# Patient Record
Sex: Male | Born: 1960
Health system: Southern US, Community
[De-identification: ages and names within clinical notes are randomized; demographics above are authoritative.]

## PROBLEM LIST (undated history)

## (undated) DIAGNOSIS — F32A Depression, unspecified: Secondary | ICD-10-CM

## (undated) DIAGNOSIS — M1990 Unspecified osteoarthritis, unspecified site: Secondary | ICD-10-CM

## (undated) DIAGNOSIS — E119 Type 2 diabetes mellitus without complications: Secondary | ICD-10-CM

## (undated) DIAGNOSIS — G4730 Sleep apnea, unspecified: Secondary | ICD-10-CM

## (undated) DIAGNOSIS — N200 Calculus of kidney: Secondary | ICD-10-CM

## (undated) DIAGNOSIS — M109 Gout, unspecified: Secondary | ICD-10-CM

## (undated) DIAGNOSIS — F419 Anxiety disorder, unspecified: Secondary | ICD-10-CM

## (undated) DIAGNOSIS — I10 Essential (primary) hypertension: Secondary | ICD-10-CM

## (undated) DIAGNOSIS — F329 Major depressive disorder, single episode, unspecified: Secondary | ICD-10-CM

## (undated) DIAGNOSIS — K219 Gastro-esophageal reflux disease without esophagitis: Secondary | ICD-10-CM

## (undated) DIAGNOSIS — N210 Calculus in bladder: Secondary | ICD-10-CM

## (undated) DIAGNOSIS — Z87442 Personal history of urinary calculi: Secondary | ICD-10-CM

## (undated) HISTORY — DX: Gastro-esophageal reflux disease without esophagitis: K21.9

## (undated) HISTORY — DX: Calculus of kidney: N20.0

## (undated) HISTORY — PX: CERVICAL FUSION: SHX112

## (undated) HISTORY — PX: BICEPS TENDON REPAIR: SHX566

## (undated) HISTORY — PX: SHOULDER SURGERY: SHX246

## (undated) HISTORY — PX: UMBILICAL HERNIA REPAIR: SHX196

## (undated) HISTORY — PX: LITHOTRIPSY: SUR834

## (undated) HISTORY — PX: HERNIA REPAIR: SHX51

---

## 2002-07-26 ENCOUNTER — Emergency Department (HOSPITAL_COMMUNITY): Admission: EM | Admit: 2002-07-26 | Discharge: 2002-07-26 | Payer: Self-pay | Admitting: Emergency Medicine

## 2002-07-26 ENCOUNTER — Encounter: Payer: Self-pay | Admitting: Emergency Medicine

## 2007-04-16 ENCOUNTER — Ambulatory Visit (HOSPITAL_COMMUNITY): Admission: RE | Admit: 2007-04-16 | Discharge: 2007-04-16 | Payer: Self-pay | Admitting: Urology

## 2007-04-23 ENCOUNTER — Ambulatory Visit (HOSPITAL_COMMUNITY): Admission: RE | Admit: 2007-04-23 | Discharge: 2007-04-23 | Payer: Self-pay | Admitting: Urology

## 2007-07-09 ENCOUNTER — Ambulatory Visit (HOSPITAL_COMMUNITY): Admission: RE | Admit: 2007-07-09 | Discharge: 2007-07-09 | Payer: Self-pay | Admitting: Urology

## 2007-07-16 ENCOUNTER — Ambulatory Visit (HOSPITAL_COMMUNITY): Admission: RE | Admit: 2007-07-16 | Discharge: 2007-07-16 | Payer: Self-pay | Admitting: Urology

## 2007-08-07 ENCOUNTER — Ambulatory Visit (HOSPITAL_COMMUNITY): Admission: RE | Admit: 2007-08-07 | Discharge: 2007-08-07 | Payer: Self-pay | Admitting: Urology

## 2008-10-26 ENCOUNTER — Inpatient Hospital Stay (HOSPITAL_COMMUNITY): Admission: RE | Admit: 2008-10-26 | Discharge: 2008-10-28 | Payer: Self-pay | Admitting: Neurosurgery

## 2010-06-10 LAB — CBC
Hemoglobin: 13.9 g/dL (ref 13.0–17.0)
RBC: 4.41 MIL/uL (ref 4.22–5.81)
RDW: 13.1 % (ref 11.5–15.5)

## 2010-07-18 NOTE — H&P (Signed)
NAME:  [name], WAGER NO.:  [id number]   MEDICAL RECORD NO.:  [id number]          PATIENT TYPE:  AMB   LOCATION:  DAY                           FACILITY:  [facility]   PHYSICIAN:  Ky Barban, M.D.DATE OF BIRTH:  1960-05-01   DATE OF ADMISSION:  04/08/2007  DATE OF DISCHARGE:  02/03/2009LH                              HISTORY & PHYSICAL   CHIEF COMPLAINT:  Right renal calculus.   HISTORY:  A 50 year old gentleman was seen by me in November 2007 with  gross hematuria and right renal colic. Workup showed that he had a stone  in both kidneys.  There is an 8 mm stone in the right kidney causing no  obstruction. I have not seen him since but he comes back having some  pain on that side wanting to get rid of this problem. At that time in  2007, he had a complete workup done for gross hematuria. The rest of the  workup was negative so bleeding probably came from this kidney. So his  coming as an outpatient to undergo ESL for right renal calculus and I  have discussed with him procedure, limitations, complications, need for  additional procedure.  Past history also includes history of having  gout, no hypertension or diabetes.  He had a right inguinal hernia and  umbilical hernia repairs done by Dr. Marlene Bast in 2000.   MEDICATION:  Allopurinol, colchicine, Xanax and Zoloft.   PERSONAL HISTORY:  He does not smoke, quit drinking a few years ago.   REVIEW OF SYSTEMS:  Unremarkable.   PHYSICAL EXAMINATION:  A well-nourished, well-developed male not in  acute distress, fully conscious, alert, oriented with blood pressure  111/64, temperature is 97.  CENTRAL NERVOUS SYSTEM:  No gross neurological deficit.  HEAD/NECK:  Negative.  CHEST:  __________.  HEART:  Regular sinus rhythm, no murmur.  ABDOMEN:  Soft, flat.  Liver, spleen, kidneys are not palpable.  No CVA  tenderness.   IMPRESSION:  Bilateral renal calculi.   PLAN:  ESL for right renal calculus.      Ky Barban, M.D.  Electronically Signed    MIJ/MEDQ  D:  04/15/2007  T:  04/16/2007  Job:  [id number]

## 2010-07-18 NOTE — Op Note (Signed)
[name], [name] NO.:  [id number]   MEDICAL RECORD NO.:  [id number]          PATIENT TYPE:  OIB   LOCATION:  3022                         FACILITY:  [facility]   PHYSICIAN:  Hilda Lias, M.D.   DATE OF BIRTH:  Jun 18, 1960   DATE OF PROCEDURE:  10/26/2008  DATE OF DISCHARGE:                               OPERATIVE REPORT   PREOPERATIVE DIAGNOSIS:  C5-6 and C6-7 herniated disk with right  radiculopathy.   POSTOPERATIVE DIAGNOSES:  C5-6 herniated disk with 7-8 fragment, right  C6-7 calcified posterior ligament.   PROCEDURE:  Anterior C5-6, C6-7 diskectomy, decompression of the spinal  cord, decompression of both C6 and C7 nerve root, interbody fusion with  auto and allograft plate and microscope.   SURGEON:  Hilda Lias, MD   ASSISTANT:  Stefani Dama, MD   CLINICAL HISTORY:  The patient was seen in my office several days ago  because of pain went to right upper extremity.  The patient had been  unable to sleep.  The only way for him to get relieved from pain was  putting his hand on top of the head.  It was quite miserable.  X-rays  showed herniated disk at the L5-6 and 6-7.  Surgery was advised.   COURSE IN THE HOSPITAL:  The patient was taken to surgery.  After  intubation, we had to hyperextend the neck.  The patient had almost no  space.  We had to pull and roll underneath the shoulder and finally we  were able to proceed with the surgery.  Transverse incision was made  through the skin and subcutaneous tissue down to the cervical area.  Prior to open it, we cleaned the skin with DuraPrep.  X-rays showed that  we were at the level of 4-5.  From then on, we identified 5-6, 6-7.  At  the level of 5-6, where we removed the anterior ligament.  Immediately,  we found quite a bit of degenerative disk.  We brought the microscope  and we opened the posterior ligament.  Once we opened the posterior  ligament from the midline to the right, there was at  least 7-8 fragment  disk compromising the C6 nerve root.  Decompression of the spinal cord  as well as foraminotomy was achieved.  At the end, we had a good  decompression with plenty of room for the C6 nerve root in the right  side and also in the left side.  At the level of C7, because of his  neck, it was quite difficult to get into disk space.  The patient had  calcification of the anterior ligament.  We had to drill the ligament to  get into the disk space.  We had to bring him in a Trendelenburg  position to be able to see C6-C7.  Then, with the microscope, we got  into the disk space and we opened the posterior ligament.  To the left  side, there was a normal herniated disk with mild compromising at the  left C7 nerve root.  From the middle to the right side, the  patient had  calcification of the ligament.  The patient had quite a bit of fibrosis  and we had to use the micro hook as well as the microcurette to  decompress the spinal cord as well as the C7 nerve root.  The patient  had quite a bit of adhesion at the level of the C7 in the right side.  However, with decompression, another two grafts of 5 mm with autograft  and BMX were inserted at the level of 5-6, 6-7 followed by a plate using  6 screws.  Lateral cervical spine showed good position of the plate from  top.  We were not able to see the appropriate level.  Once we  accomplished hemostasis, nevertheless we left the drain and the wound  was closed with Vicryl and Steri-Strips.           ______________________________  Hilda Lias, M.D.     EB/MEDQ  D:  10/26/2008  T:  10/27/2008  Job:  [id number]

## 2011-08-15 ENCOUNTER — Encounter (HOSPITAL_COMMUNITY): Payer: Self-pay | Admitting: *Deleted

## 2011-08-15 ENCOUNTER — Emergency Department (HOSPITAL_COMMUNITY): Payer: BC Managed Care – PPO

## 2011-08-15 ENCOUNTER — Emergency Department (HOSPITAL_COMMUNITY)
Admission: EM | Admit: 2011-08-15 | Discharge: 2011-08-15 | Disposition: A | Discharge status: Home / Self Care | Payer: BC Managed Care – PPO | Attending: Emergency Medicine | Admitting: Emergency Medicine

## 2011-08-15 DIAGNOSIS — M25519 Pain in unspecified shoulder: Secondary | ICD-10-CM | POA: Insufficient documentation

## 2011-08-15 DIAGNOSIS — F3289 Other specified depressive episodes: Secondary | ICD-10-CM | POA: Insufficient documentation

## 2011-08-15 DIAGNOSIS — S46911A Strain of unspecified muscle, fascia and tendon at shoulder and upper arm level, right arm, initial encounter: Secondary | ICD-10-CM

## 2011-08-15 DIAGNOSIS — F411 Generalized anxiety disorder: Secondary | ICD-10-CM | POA: Insufficient documentation

## 2011-08-15 DIAGNOSIS — S96911A Strain of unspecified muscle and tendon at ankle and foot level, right foot, initial encounter: Secondary | ICD-10-CM

## 2011-08-15 DIAGNOSIS — F329 Major depressive disorder, single episode, unspecified: Secondary | ICD-10-CM | POA: Insufficient documentation

## 2011-08-15 DIAGNOSIS — F1010 Alcohol abuse, uncomplicated: Secondary | ICD-10-CM | POA: Insufficient documentation

## 2011-08-15 DIAGNOSIS — M109 Gout, unspecified: Secondary | ICD-10-CM | POA: Insufficient documentation

## 2011-08-15 DIAGNOSIS — M25579 Pain in unspecified ankle and joints of unspecified foot: Secondary | ICD-10-CM | POA: Insufficient documentation

## 2011-08-15 HISTORY — DX: Major depressive disorder, single episode, unspecified: F32.9

## 2011-08-15 HISTORY — DX: Depression, unspecified: F32.A

## 2011-08-15 HISTORY — DX: Gout, unspecified: M10.9

## 2011-08-15 HISTORY — DX: Anxiety disorder, unspecified: F41.9

## 2011-08-15 MED ORDER — MELOXICAM 7.5 MG PO TABS: ORAL_TABLET | ORAL | Status: DC
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
## 2011-08-15 NOTE — ED Provider Notes (Signed)
Medical screening examination/treatment/procedure(s) were performed by non-physician practitioner and as supervising physician I was immediately available for consultation/collaboration.   Joya Gaskins, MD 08/15/11 1420

## 2011-08-15 NOTE — Discharge Instructions (Signed)
Please use the ankle splint for the next 10 to 14 days. Please elevate the ankle as much as possible. Mobic daily for inflammation and swelling.Ankle Sprain An ankle sprain is an injury to the strong, fibrous tissues (ligaments) that hold the bones of your ankle joint together.  CAUSES Ankle sprain usually is caused by a fall or by twisting your ankle. People who participate in sports are more prone to these types of injuries.  SYMPTOMS  Symptoms of ankle sprain include:  Pain in your ankle. The pain may be present at rest or only when you are trying to stand or walk.   Swelling.   Bruising. Bruising may develop immediately or within 1 to 2 days after your injury.   Difficulty standing or walking.  DIAGNOSIS  Your caregiver will ask you details about your injury and perform a physical exam of your ankle to determine if you have an ankle sprain. During the physical exam, your caregiver will press and squeeze specific areas of your foot and ankle. Your caregiver will try to move your ankle in certain ways. An X-ray exam may be done to be sure a bone was not broken or a ligament did not separate from one of the bones in your ankle (avulsion).  TREATMENT  Certain types of braces can help stabilize your ankle. Your caregiver can make a recommendation for this. Your caregiver may recommend the use of medication for pain. If your sprain is severe, your caregiver may refer you to a surgeon who helps to restore function to parts of your skeletal system (orthopedist) or a physical therapist. HOME CARE INSTRUCTIONS  Apply ice to your injury for 1 to 2 days or as directed by your caregiver. Applying ice helps to reduce inflammation and pain.  Put ice in a plastic bag.   Place a towel between your skin and the bag.   Leave the ice on for 15 to 20 minutes at a time, every 2 hours while you are awake.   Take over-the-counter or prescription medicines for pain, discomfort, or fever only as directed by  your caregiver.   Keep your injured leg elevated, when possible, to lessen swelling.   If your caregiver recommends crutches, use them as instructed. Gradually, put weight on the affected ankle. Continue to use crutches or a cane until you can walk without feeling pain in your ankle.   If you have a plaster splint, wear the splint as directed by your caregiver. Do not rest it on anything harder than a pillow the first 24 hours. Do not put weight on it. Do not get it wet. You may take it off to take a shower or bath.   You may have been given an elastic bandage to wear around your ankle to provide support. If the elastic bandage is too tight (you have numbness or tingling in your foot or your foot becomes cold and blue), adjust the bandage to make it comfortable.   If you have an air splint, you may blow more air into it or let air out to make it more comfortable. You may take your splint off at night and before taking a shower or bath.   Wiggle your toes in the splint several times per day if you are able.  SEEK MEDICAL CARE IF:   You have an increase in bruising, swelling, or pain.   Your toes feel cold.   Pain relief is not achieved with medication.  SEEK IMMEDIATE MEDICAL CARE IF: Your  toes are numb or blue or you have severe pain. MAKE SURE YOU:   Understand these instructions.   Will watch your condition.   Will get help right away if you are not doing well or get worse.  Document Released: 02/19/2005 Document Revised: 02/08/2011 Document Reviewed: 09/24/2007 [facility] Patient Information 2012 [place], [place].
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
## 2011-10-17 ENCOUNTER — Emergency Department (HOSPITAL_COMMUNITY)
Admission: EM | Admit: 2011-10-17 | Discharge: 2011-10-17 | Disposition: A | Discharge status: Home / Self Care | Payer: BC Managed Care – PPO | Attending: Emergency Medicine | Admitting: Emergency Medicine

## 2011-10-17 ENCOUNTER — Encounter (HOSPITAL_COMMUNITY): Payer: Self-pay | Admitting: *Deleted

## 2011-10-17 DIAGNOSIS — B349 Viral infection, unspecified: Secondary | ICD-10-CM

## 2011-10-17 DIAGNOSIS — F329 Major depressive disorder, single episode, unspecified: Secondary | ICD-10-CM | POA: Insufficient documentation

## 2011-10-17 DIAGNOSIS — R110 Nausea: Secondary | ICD-10-CM | POA: Insufficient documentation

## 2011-10-17 DIAGNOSIS — IMO0001 Reserved for inherently not codable concepts without codable children: Secondary | ICD-10-CM | POA: Insufficient documentation

## 2011-10-17 DIAGNOSIS — I10 Essential (primary) hypertension: Secondary | ICD-10-CM | POA: Insufficient documentation

## 2011-10-17 DIAGNOSIS — F3289 Other specified depressive episodes: Secondary | ICD-10-CM | POA: Insufficient documentation

## 2011-10-17 DIAGNOSIS — F411 Generalized anxiety disorder: Secondary | ICD-10-CM | POA: Insufficient documentation

## 2011-10-17 DIAGNOSIS — Z981 Arthrodesis status: Secondary | ICD-10-CM | POA: Insufficient documentation

## 2011-10-17 DIAGNOSIS — M109 Gout, unspecified: Secondary | ICD-10-CM | POA: Insufficient documentation

## 2011-10-17 DIAGNOSIS — R42 Dizziness and giddiness: Secondary | ICD-10-CM

## 2011-10-17 HISTORY — DX: Essential (primary) hypertension: I10

## 2011-10-17 LAB — URINALYSIS, ROUTINE W REFLEX MICROSCOPIC
Glucose, UA: NEGATIVE mg/dL
Hgb urine dipstick: NEGATIVE
Leukocytes, UA: NEGATIVE
Protein, ur: NEGATIVE mg/dL
Specific Gravity, Urine: 1.024 (ref 1.005–1.030)
pH: 5 (ref 5.0–8.0)

## 2011-10-17 LAB — CBC
Hemoglobin: 16.6 g/dL (ref 13.0–17.0)
MCH: 31.9 pg (ref 26.0–34.0)
Platelets: 167 10*3/uL (ref 150–400)
RBC: 5.21 MIL/uL (ref 4.22–5.81)
WBC: 13.5 10*3/uL — ABNORMAL HIGH (ref 4.0–10.5)

## 2011-10-17 LAB — POCT I-STAT, CHEM 8
BUN: 12 mg/dL (ref 6–23)
Chloride: 102 mEq/L (ref 96–112)
Creatinine, Ser: 1.1 mg/dL (ref 0.50–1.35)
Sodium: 142 mEq/L (ref 135–145)
TCO2: 27 mmol/L (ref 0–100)

## 2011-10-17 MED ORDER — SODIUM CHLORIDE 0.9 % IV BOLUS (SEPSIS)
1000.0000 mL | Freq: Once | INTRAVENOUS | Status: AC
  Administered 2011-10-17: 1000 mL via INTRAVENOUS

## 2011-10-17 MED ORDER — ONDANSETRON 4 MG PO TBDP
8.0000 mg | ORAL_TABLET | Freq: Once | ORAL | Status: AC
  Administered 2011-10-17: 8 mg via ORAL
  Filled 2011-10-17: qty 2

## 2011-10-17 MED ORDER — ONDANSETRON 8 MG PO TBDP: 8.0000 mg | ORAL_TABLET | Freq: Three times a day (TID) | ORAL | Status: AC | PRN

## 2011-10-17 MED ORDER — MECLIZINE HCL 25 MG PO TABS
25.0000 mg | ORAL_TABLET | Freq: Once | ORAL | Status: AC
  Administered 2011-10-17: 25 mg via ORAL
  Filled 2011-10-17: qty 1

## 2011-10-17 NOTE — ED Notes (Signed)
Reports onset last night of bodyaches and pain, woke up this am "feeling foggy headed" and nauseated. No acute distress noted at triage.

## 2011-10-17 NOTE — ED Provider Notes (Signed)
History     CSN: [id number]  Arrival date & time 10/17/11  4098   First MD Initiated Contact with Patient 10/17/11 1304      Chief Complaint  Patient presents with  . Nausea  . Generalized Body Aches    (Consider location/radiation/quality/duration/timing/severity/associated sxs/prior treatment) HPI Comments: Patient comes in today with a chief complaint of fever, chills, generalized body aches, nausea, fatigue, and decreased appetite.  He has had these symptoms for the past couple of days.  Symptoms gradually worsening.  Wife has had similar symptoms.  This morning he began feeling dizzy.  He describes dizziness as both feeling lightheaded and vertigo.  No syncope.  He denies any Chest pain or SOB.  He is feeling nauseous, but no vomiting.  No abdominal pain.  He denies cough.  He denies dysuria.  No headache.  He has not taken anything for his symptoms.    Patient is a 51 y.o. male presenting with fever. The history is provided by the patient.  Fever Primary symptoms of the febrile illness include fever, fatigue, nausea and myalgias. Primary symptoms do not include visual change, headaches, cough, wheezing, shortness of breath, abdominal pain, vomiting, diarrhea, dysuria, altered mental status or rash.    Past Medical History  Diagnosis Date  . Depression   . Anxiety   . Gout   . Hypertension     Past Surgical History  Procedure Date  . Cervical fusion   . Hernia repair     No family history on file.  History  Substance Use Topics  . Smoking status: Never Smoker   . Smokeless tobacco: Not on file  . Alcohol Use: Yes     3 x /wk      Review of Systems  Constitutional: Positive for fever, chills, appetite change and fatigue. Negative for diaphoresis.  HENT: Negative for sore throat, neck pain and neck stiffness.   Eyes: Negative for visual disturbance.  Respiratory: Negative for cough, chest tightness, shortness of breath and wheezing.   Cardiovascular: Negative  for chest pain.  Gastrointestinal: Positive for nausea. Negative for vomiting, abdominal pain, diarrhea and constipation.  Genitourinary: Negative for dysuria, urgency and hematuria.  Musculoskeletal: Positive for myalgias.  Skin: Negative for rash.  Neurological: Positive for dizziness and light-headedness. Negative for syncope, numbness and headaches.       Generalized weakness  Psychiatric/Behavioral: Negative for confusion and altered mental status.    Allergies  Other  Home Medications   Current Outpatient Rx  Name Route Sig Dispense Refill  . ALLOPURINOL 300 MG PO TABS Oral Take 300 mg by mouth daily.    Marland Kitchen ALPRAZOLAM 0.5 MG PO TABS Oral Take 0.5 mg by mouth daily.    Marland Kitchen HYDROCODONE-ACETAMINOPHEN 5-325 MG PO TABS Oral Take 1 tablet by mouth every 4 (four) hours as needed. For pain    . LISINOPRIL PO Oral Take 1 tablet by mouth daily.    Marland Kitchen ZOLOFT PO Oral Take 1 tablet by mouth daily.      BP 154/98  Pulse 95  Temp 99.8 F (37.7 C) (Oral)  Resp 17  SpO2 98%  Physical Exam  Nursing note and vitals reviewed. Constitutional: He appears well-developed and well-nourished. No distress.  HENT:  Head: Normocephalic and atraumatic.  Mouth/Throat: Oropharynx is clear and moist.  Eyes: EOM are normal. Pupils are equal, round, and reactive to light.  Neck: Normal range of motion. Neck supple.  Cardiovascular: Normal rate, regular rhythm and normal heart sounds.  Pulmonary/Chest: Effort normal and breath sounds normal. No respiratory distress. He has no wheezes. He has no rales. He exhibits no tenderness.  Abdominal: Soft. Bowel sounds are normal. He exhibits no distension and no mass. There is no tenderness. There is no rebound and no guarding.  Neurological: He is alert. He has normal strength. No cranial nerve deficit or sensory deficit. Gait normal.  Skin: Skin is warm. He is not diaphoretic.  Psychiatric: He has a normal mood and affect.    ED Course  Procedures (including  critical care time)  Labs Reviewed  CBC - Abnormal; Notable for the following:    WBC 13.5 (*)     MCHC 36.3 (*)     All other components within normal limits  POCT I-STAT, CHEM 8 - Abnormal; Notable for the following:    Glucose, Bld 103 (*)     Hemoglobin 17.3 (*)     All other components within normal limits  URINALYSIS, ROUTINE W REFLEX MICROSCOPIC   No results found.   Date: 10/17/2011  Rate: 90  Rhythm: normal sinus rhythm  QRS Axis: normal  Intervals: normal  ST/T Wave abnormalities: normal  Conduction Disutrbances:none  Narrative Interpretation:   Old EKG Reviewed: none available   No diagnosis found.  3:47 PM Reassessed patient.  He reports that his symptoms have improved at this time after given IVF.  Dizziness has significantly improved.    MDM  Patient presenting with low grade fever, dizziness, nausea, decreased appetite, generalized body aches, and fatigue.  Suspect that symptoms are viral.  Patient with normal EKG.  Dizziness improved after given IVF.  UA negative.  Labs unremarkable.  Patient discharged home.  Strict return precautions given to the patient.  Patient in agreement with the plan.        Pascal Lux [place], PA-C 10/17/11 2056

## 2011-10-17 NOTE — ED Notes (Signed)
[  t states he woke this am feeling dizzy and nauseated.  Pt state he had fever of 99.0 this am.  No fever preventive medications were taken

## 2011-10-17 NOTE — ED Notes (Signed)
Pt reports body aches

## 2011-10-17 NOTE — ED Notes (Signed)
Pt reports dizziness with sitting and standing during orthostatics

## 2011-10-19 NOTE — ED Provider Notes (Signed)
Medical screening examination/treatment/procedure(s) were conducted as a shared visit with non-physician practitioner(s) and myself.  I personally evaluated the patient during the encounter  Ethelda Chick, MD 10/19/11 [phone]

## 2012-07-25 ENCOUNTER — Encounter (HOSPITAL_COMMUNITY): Payer: Self-pay | Admitting: *Deleted

## 2012-07-29 ENCOUNTER — Encounter (HOSPITAL_COMMUNITY): Payer: Self-pay | Admitting: Anesthesiology

## 2012-07-29 ENCOUNTER — Ambulatory Visit (HOSPITAL_COMMUNITY): Payer: Worker's Compensation | Admitting: Anesthesiology

## 2012-07-29 ENCOUNTER — Ambulatory Visit (HOSPITAL_COMMUNITY): Payer: Worker's Compensation

## 2012-07-29 ENCOUNTER — Encounter (HOSPITAL_COMMUNITY): Payer: Self-pay | Admitting: *Deleted

## 2012-07-29 ENCOUNTER — Ambulatory Visit (HOSPITAL_COMMUNITY)
Admission: RE | Admit: 2012-07-29 | Discharge: 2012-07-30 | Disposition: A | Discharge status: Home / Self Care | Payer: Worker's Compensation | Source: Ambulatory Visit | Attending: Orthopedic Surgery | Admitting: Orthopedic Surgery

## 2012-07-29 ENCOUNTER — Encounter (HOSPITAL_COMMUNITY)
Admission: RE | Disposition: A | Discharge status: Home / Self Care | Payer: Self-pay | Source: Ambulatory Visit | Attending: Orthopedic Surgery

## 2012-07-29 DIAGNOSIS — Y990 Civilian activity done for income or pay: Secondary | ICD-10-CM | POA: Insufficient documentation

## 2012-07-29 DIAGNOSIS — M24119 Other articular cartilage disorders, unspecified shoulder: Secondary | ICD-10-CM | POA: Insufficient documentation

## 2012-07-29 DIAGNOSIS — Z79899 Other long term (current) drug therapy: Secondary | ICD-10-CM | POA: Insufficient documentation

## 2012-07-29 DIAGNOSIS — S43429A Sprain of unspecified rotator cuff capsule, initial encounter: Secondary | ICD-10-CM | POA: Insufficient documentation

## 2012-07-29 DIAGNOSIS — M19019 Primary osteoarthritis, unspecified shoulder: Secondary | ICD-10-CM | POA: Insufficient documentation

## 2012-07-29 DIAGNOSIS — X58XXXA Exposure to other specified factors, initial encounter: Secondary | ICD-10-CM | POA: Insufficient documentation

## 2012-07-29 DIAGNOSIS — M75102 Unspecified rotator cuff tear or rupture of left shoulder, not specified as traumatic: Secondary | ICD-10-CM

## 2012-07-29 DIAGNOSIS — I10 Essential (primary) hypertension: Secondary | ICD-10-CM | POA: Insufficient documentation

## 2012-07-29 DIAGNOSIS — Y929 Unspecified place or not applicable: Secondary | ICD-10-CM | POA: Insufficient documentation

## 2012-07-29 DIAGNOSIS — M25819 Other specified joint disorders, unspecified shoulder: Secondary | ICD-10-CM | POA: Insufficient documentation

## 2012-07-29 HISTORY — DX: Calculus in bladder: N21.0

## 2012-07-29 HISTORY — PX: SHOULDER ARTHROSCOPY WITH ROTATOR CUFF REPAIR AND SUBACROMIAL DECOMPRESSION: SHX5686

## 2012-07-29 HISTORY — DX: Sleep apnea, unspecified: G47.30

## 2012-07-29 LAB — BASIC METABOLIC PANEL
BUN: 11 mg/dL (ref 6–23)
Calcium: 9.5 mg/dL (ref 8.4–10.5)
Chloride: 103 mEq/L (ref 96–112)
Creatinine, Ser: 1.1 mg/dL (ref 0.50–1.35)
GFR calc Af Amer: 89 mL/min — ABNORMAL LOW (ref >90)
GFR calc non Af Amer: 77 mL/min — ABNORMAL LOW (ref >90)

## 2012-07-29 LAB — CBC
MCHC: 36.4 g/dL — ABNORMAL HIGH (ref 30.0–36.0)
Platelets: 203 10*3/uL (ref 150–400)
RDW: 12.8 % (ref 11.5–15.5)
WBC: 7.1 10*3/uL (ref 4.0–10.5)

## 2012-07-29 LAB — SURGICAL PCR SCREEN
MRSA, PCR: NEGATIVE
Staphylococcus aureus: NEGATIVE

## 2012-07-29 SURGERY — SHOULDER ARTHROSCOPY WITH ROTATOR CUFF REPAIR AND SUBACROMIAL DECOMPRESSION
Anesthesia: Regional | Site: Shoulder | Laterality: Left | Wound class: Clean

## 2012-07-29 MED ORDER — PROPOFOL 10 MG/ML IV BOLUS
INTRAVENOUS | Status: DC | PRN
  Administered 2012-07-29: 260 mg via INTRAVENOUS

## 2012-07-29 MED ORDER — DEXTROSE 5 % IV SOLN
INTRAVENOUS | Status: DC | PRN
  Administered 2012-07-29: 13:00:00 via INTRAVENOUS

## 2012-07-29 MED ORDER — MENTHOL 3 MG MT LOZG: 1.0000 | LOZENGE | OROMUCOSAL | Status: DC | PRN

## 2012-07-29 MED ORDER — METHOCARBAMOL 500 MG PO TABS
500.0000 mg | ORAL_TABLET | Freq: Four times a day (QID) | ORAL | Status: DC | PRN
  Administered 2012-07-29 – 2012-07-30 (×2): 500 mg via ORAL
  Filled 2012-07-29: qty 1

## 2012-07-29 MED ORDER — LIDOCAINE HCL (CARDIAC) 20 MG/ML IV SOLN
INTRAVENOUS | Status: DC | PRN
  Administered 2012-07-29: 100 mg via INTRAVENOUS

## 2012-07-29 MED ORDER — HYDROMORPHONE HCL PF 1 MG/ML IJ SOLN
INTRAMUSCULAR | Status: AC
  Filled 2012-07-29: qty 1

## 2012-07-29 MED ORDER — HYDROMORPHONE HCL PF 1 MG/ML IJ SOLN
0.2500 mg | INTRAMUSCULAR | Status: DC | PRN
  Administered 2012-07-29 (×2): 0.5 mg via INTRAVENOUS

## 2012-07-29 MED ORDER — LACTATED RINGERS IV SOLN
INTRAVENOUS | Status: DC | PRN
  Administered 2012-07-29 (×3): via INTRAVENOUS

## 2012-07-29 MED ORDER — SUCCINYLCHOLINE CHLORIDE 20 MG/ML IJ SOLN
INTRAMUSCULAR | Status: DC | PRN
  Administered 2012-07-29: 160 mg via INTRAVENOUS

## 2012-07-29 MED ORDER — ROCURONIUM BROMIDE 100 MG/10ML IV SOLN
INTRAVENOUS | Status: DC | PRN
  Administered 2012-07-29: 50 mg via INTRAVENOUS

## 2012-07-29 MED ORDER — FENTANYL CITRATE 0.05 MG/ML IJ SOLN
INTRAMUSCULAR | Status: DC | PRN
  Administered 2012-07-29 (×2): 50 ug via INTRAVENOUS

## 2012-07-29 MED ORDER — CEFAZOLIN SODIUM 1-5 GM-% IV SOLN
INTRAVENOUS | Status: DC | PRN
  Administered 2012-07-29: 2 g via INTRAVENOUS

## 2012-07-29 MED ORDER — PHENYLEPHRINE HCL 10 MG/ML IJ SOLN
INTRAMUSCULAR | Status: DC | PRN
  Administered 2012-07-29 (×2): 40 ug via INTRAVENOUS
  Administered 2012-07-29: 80 ug via INTRAVENOUS
  Administered 2012-07-29: 40 ug via INTRAVENOUS
  Administered 2012-07-29: 80 ug via INTRAVENOUS
  Administered 2012-07-29 (×2): 40 ug via INTRAVENOUS

## 2012-07-29 MED ORDER — METOCLOPRAMIDE HCL 5 MG/ML IJ SOLN
INTRAMUSCULAR | Status: AC
  Filled 2012-07-29: qty 2

## 2012-07-29 MED ORDER — ALUM & MAG HYDROXIDE-SIMETH 200-200-20 MG/5ML PO SUSP: 30.0000 mL | ORAL | Status: DC | PRN

## 2012-07-29 MED ORDER — POLYETHYLENE GLYCOL 3350 17 G PO PACK: 17.0000 g | PACK | Freq: Every day | ORAL | Status: DC | PRN

## 2012-07-29 MED ORDER — OXYCODONE HCL 5 MG PO TABS: 5.0000 mg | ORAL_TABLET | Freq: Once | ORAL | Status: DC | PRN

## 2012-07-29 MED ORDER — MIDAZOLAM HCL 5 MG/5ML IJ SOLN
INTRAMUSCULAR | Status: DC | PRN
  Administered 2012-07-29 (×2): 1 mg via INTRAVENOUS

## 2012-07-29 MED ORDER — ALPRAZOLAM 0.5 MG PO TABS: 0.5000 mg | ORAL_TABLET | Freq: Two times a day (BID) | ORAL | Status: DC | PRN

## 2012-07-29 MED ORDER — TEMAZEPAM 15 MG PO CAPS: 15.0000 mg | ORAL_CAPSULE | Freq: Every evening | ORAL | Status: DC | PRN

## 2012-07-29 MED ORDER — MUPIROCIN 2 % EX OINT
TOPICAL_OINTMENT | CUTANEOUS | Status: AC
  Administered 2012-07-29: 12:00:00
  Filled 2012-07-29: qty 22

## 2012-07-29 MED ORDER — METOCLOPRAMIDE HCL 5 MG/ML IJ SOLN
5.0000 mg | Freq: Three times a day (TID) | INTRAMUSCULAR | Status: DC | PRN
  Administered 2012-07-29: 5 mg via INTRAVENOUS

## 2012-07-29 MED ORDER — LISINOPRIL 5 MG PO TABS
5.0000 mg | ORAL_TABLET | Freq: Every day | ORAL | Status: DC
  Administered 2012-07-30: 5 mg via ORAL
  Filled 2012-07-29: qty 1

## 2012-07-29 MED ORDER — BISACODYL 5 MG PO TBEC: 5.0000 mg | DELAYED_RELEASE_TABLET | Freq: Every day | ORAL | Status: DC | PRN

## 2012-07-29 MED ORDER — GLYCOPYRROLATE 0.2 MG/ML IJ SOLN
INTRAMUSCULAR | Status: DC | PRN
  Administered 2012-07-29: .15 mg via INTRAVENOUS
  Administered 2012-07-29: .8 mg via INTRAVENOUS
  Administered 2012-07-29: 0.1 mg via INTRAVENOUS

## 2012-07-29 MED ORDER — FENTANYL CITRATE 0.05 MG/ML IJ SOLN: 50.0000 ug | Freq: Once | INTRAMUSCULAR | Status: DC

## 2012-07-29 MED ORDER — DEXAMETHASONE SODIUM PHOSPHATE 4 MG/ML IJ SOLN
INTRAMUSCULAR | Status: DC | PRN
  Administered 2012-07-29: 8 mg via INTRAVENOUS
  Administered 2012-07-29: 4 mg

## 2012-07-29 MED ORDER — KETOROLAC TROMETHAMINE 15 MG/ML IJ SOLN
15.0000 mg | Freq: Four times a day (QID) | INTRAMUSCULAR | Status: DC
  Administered 2012-07-29 – 2012-07-30 (×3): 15 mg via INTRAVENOUS
  Filled 2012-07-29 (×7): qty 1

## 2012-07-29 MED ORDER — ONDANSETRON HCL 4 MG/2ML IJ SOLN
INTRAMUSCULAR | Status: DC | PRN
  Administered 2012-07-29: 4 mg via INTRAVENOUS

## 2012-07-29 MED ORDER — EPHEDRINE SULFATE 50 MG/ML IJ SOLN
INTRAMUSCULAR | Status: DC | PRN
  Administered 2012-07-29: 10 mg via INTRAVENOUS

## 2012-07-29 MED ORDER — FLEET ENEMA 7-19 GM/118ML RE ENEM: 1.0000 | ENEMA | Freq: Once | RECTAL | Status: AC | PRN

## 2012-07-29 MED ORDER — ALLOPURINOL 150 MG HALF TABLET
150.0000 mg | ORAL_TABLET | Freq: Every day | ORAL | Status: DC
  Administered 2012-07-30: 150 mg via ORAL
  Filled 2012-07-29: qty 1

## 2012-07-29 MED ORDER — LACTATED RINGERS IV SOLN: INTRAVENOUS | Status: DC

## 2012-07-29 MED ORDER — BUPIVACAINE-EPINEPHRINE PF 0.5-1:200000 % IJ SOLN
INTRAMUSCULAR | Status: DC | PRN
  Administered 2012-07-29: 25 mL

## 2012-07-29 MED ORDER — HYDROMORPHONE HCL PF 1 MG/ML IJ SOLN: 0.2500 mg | INTRAMUSCULAR | Status: DC | PRN

## 2012-07-29 MED ORDER — OXYCODONE-ACETAMINOPHEN 5-325 MG PO TABS
ORAL_TABLET | ORAL | Status: AC
  Filled 2012-07-29: qty 2

## 2012-07-29 MED ORDER — PHENOL 1.4 % MT LIQD: 1.0000 | OROMUCOSAL | Status: DC | PRN

## 2012-07-29 MED ORDER — SERTRALINE HCL 100 MG PO TABS
100.0000 mg | ORAL_TABLET | Freq: Every day | ORAL | Status: DC
  Administered 2012-07-30: 100 mg via ORAL
  Filled 2012-07-29: qty 1

## 2012-07-29 MED ORDER — METHOCARBAMOL 500 MG PO TABS
ORAL_TABLET | ORAL | Status: AC
  Filled 2012-07-29: qty 1

## 2012-07-29 MED ORDER — ACETAMINOPHEN 650 MG RE SUPP: 650.0000 mg | Freq: Four times a day (QID) | RECTAL | Status: DC | PRN

## 2012-07-29 MED ORDER — METOCLOPRAMIDE HCL 5 MG PO TABS
5.0000 mg | ORAL_TABLET | Freq: Three times a day (TID) | ORAL | Status: DC | PRN
  Filled 2012-07-29: qty 2

## 2012-07-29 MED ORDER — OXYCODONE-ACETAMINOPHEN 5-325 MG PO TABS
1.0000 | ORAL_TABLET | ORAL | Status: DC | PRN
  Administered 2012-07-29 – 2012-07-30 (×3): 2 via ORAL
  Filled 2012-07-29 (×3): qty 2

## 2012-07-29 MED ORDER — LACTATED RINGERS IV SOLN
INTRAVENOUS | Status: DC
  Administered 2012-07-29: 12:00:00 via INTRAVENOUS

## 2012-07-29 MED ORDER — NEOSTIGMINE METHYLSULFATE 1 MG/ML IJ SOLN
INTRAMUSCULAR | Status: DC | PRN
  Administered 2012-07-29: 5 mg via INTRAVENOUS

## 2012-07-29 MED ORDER — MUPIROCIN 2 % EX OINT
TOPICAL_OINTMENT | Freq: Two times a day (BID) | CUTANEOUS | Status: DC
  Administered 2012-07-30: 10:00:00 via NASAL
  Filled 2012-07-29: qty 22

## 2012-07-29 MED ORDER — CEFAZOLIN SODIUM 1-5 GM-% IV SOLN
INTRAVENOUS | Status: AC
  Filled 2012-07-29: qty 100

## 2012-07-29 MED ORDER — MIDAZOLAM HCL 2 MG/2ML IJ SOLN: 1.0000 mg | INTRAMUSCULAR | Status: DC | PRN

## 2012-07-29 MED ORDER — SODIUM CHLORIDE 0.9 % IR SOLN
Status: DC | PRN
  Administered 2012-07-29: 18000 mL

## 2012-07-29 MED ORDER — METHOCARBAMOL 100 MG/ML IJ SOLN
500.0000 mg | Freq: Four times a day (QID) | INTRAVENOUS | Status: DC | PRN
  Filled 2012-07-29: qty 5

## 2012-07-29 MED ORDER — DOCUSATE SODIUM 100 MG PO CAPS
100.0000 mg | ORAL_CAPSULE | Freq: Two times a day (BID) | ORAL | Status: DC
  Administered 2012-07-29 – 2012-07-30 (×2): 100 mg via ORAL
  Filled 2012-07-29 (×3): qty 1

## 2012-07-29 MED ORDER — ONDANSETRON HCL 4 MG PO TABS: 4.0000 mg | ORAL_TABLET | Freq: Four times a day (QID) | ORAL | Status: DC | PRN

## 2012-07-29 MED ORDER — DIPHENHYDRAMINE HCL 12.5 MG/5ML PO ELIX: 12.5000 mg | ORAL_SOLUTION | ORAL | Status: DC | PRN

## 2012-07-29 MED ORDER — ONDANSETRON HCL 4 MG/2ML IJ SOLN: 4.0000 mg | Freq: Four times a day (QID) | INTRAMUSCULAR | Status: DC | PRN

## 2012-07-29 MED ORDER — ACETAMINOPHEN 325 MG PO TABS: 650.0000 mg | ORAL_TABLET | Freq: Four times a day (QID) | ORAL | Status: DC | PRN

## 2012-07-29 MED ORDER — OXYCODONE HCL 5 MG/5ML PO SOLN: 5.0000 mg | Freq: Once | ORAL | Status: DC | PRN

## 2012-07-29 SURGICAL SUPPLY — 58 items
ANCHOR PEEK 4.75X19.1 SWLK C (Anchor) ×4 IMPLANT
ANCHOR PEEK CORKSCREW 4.5 (Anchor) ×4 IMPLANT
BLADE CUTTER GATOR 3.5 (BLADE) ×2 IMPLANT
BLADE GREAT WHITE 4.2 (BLADE) ×2 IMPLANT
BLADE SURG 11 STRL SS (BLADE) ×2 IMPLANT
BOOTCOVER CLEANROOM LRG (PROTECTIVE WEAR) ×4 IMPLANT
BUR 3.5 LG SPHERICAL (BURR) IMPLANT
BUR OVAL 4.0 (BURR) ×2 IMPLANT
BURR 3.5 LG SPHERICAL (BURR)
CANISTER SUCT LVC 12 LTR MEDI- (MISCELLANEOUS) ×2 IMPLANT
CANNULA ACUFLEX KIT 5X76 (CANNULA) ×2 IMPLANT
CANNULA DRILOCK 5.0X75 (CANNULA) IMPLANT
CLOTH BEACON ORANGE TIMEOUT ST (SAFETY) ×2 IMPLANT
CLSR STERI-STRIP ANTIMIC 1/2X4 (GAUZE/BANDAGES/DRESSINGS) ×2 IMPLANT
CONNECTOR 5 IN 1 STRAIGHT STRL (MISCELLANEOUS) ×2 IMPLANT
DRAPE INCISE 23X17 IOBAN STRL (DRAPES) ×1
DRAPE INCISE IOBAN 23X17 STRL (DRAPES) ×1 IMPLANT
DRAPE INCISE IOBAN 66X45 STRL (DRAPES) ×2 IMPLANT
DRAPE STERI 35X30 U-POUCH (DRAPES) ×2 IMPLANT
DRAPE SURG 17X11 SM STRL (DRAPES) ×2 IMPLANT
DRAPE U-SHAPE 47X51 STRL (DRAPES) ×2 IMPLANT
DRSG PAD ABDOMINAL 8X10 ST (GAUZE/BANDAGES/DRESSINGS) ×2 IMPLANT
DURAPREP 26ML APPLICATOR (WOUND CARE) ×4 IMPLANT
ELECT REM PT RETURN 9FT ADLT (ELECTROSURGICAL) ×2
ELECTRODE REM PT RTRN 9FT ADLT (ELECTROSURGICAL) ×1 IMPLANT
GLOVE BIO SURGEON STRL SZ7.5 (GLOVE) ×2 IMPLANT
GLOVE BIO SURGEON STRL SZ8 (GLOVE) ×2 IMPLANT
GLOVE EUDERMIC 7 POWDERFREE (GLOVE) ×2 IMPLANT
GLOVE SS BIOGEL STRL SZ 7.5 (GLOVE) ×1 IMPLANT
GLOVE SUPERSENSE BIOGEL SZ 7.5 (GLOVE) ×1
GOWN STRL NON-REIN LRG LVL3 (GOWN DISPOSABLE) ×2 IMPLANT
GOWN STRL REIN XL XLG (GOWN DISPOSABLE) ×8 IMPLANT
KIT BASIN OR (CUSTOM PROCEDURE TRAY) ×2 IMPLANT
KIT ROOM TURNOVER OR (KITS) ×2 IMPLANT
KIT SHOULDER TRACTION (DRAPES) ×2 IMPLANT
MANIFOLD NEPTUNE II (INSTRUMENTS) ×2 IMPLANT
NDL SUT 6 .5 CRC .975X.05 MAYO (NEEDLE) ×1 IMPLANT
NEEDLE MAYO TAPER (NEEDLE) ×1
NEEDLE SPNL 18GX3.5 QUINCKE PK (NEEDLE) ×2 IMPLANT
NS IRRIG 1000ML POUR BTL (IV SOLUTION) ×2 IMPLANT
PACK SHOULDER (CUSTOM PROCEDURE TRAY) ×2 IMPLANT
PAD ARMBOARD 7.5X6 YLW CONV (MISCELLANEOUS) ×4 IMPLANT
SET ARTHROSCOPY TUBING (MISCELLANEOUS) ×1
SET ARTHROSCOPY TUBING LN (MISCELLANEOUS) ×1 IMPLANT
SLING ARM FOAM STRAP LRG (SOFTGOODS) IMPLANT
SLING ARM FOAM STRAP MED (SOFTGOODS) ×2 IMPLANT
SPONGE GAUZE 4X4 12PLY (GAUZE/BANDAGES/DRESSINGS) ×2 IMPLANT
SPONGE LAP 4X18 X RAY DECT (DISPOSABLE) ×2 IMPLANT
STRIP CLOSURE SKIN 1/2X4 (GAUZE/BANDAGES/DRESSINGS) ×2 IMPLANT
SUT MNCRL AB 3-0 PS2 18 (SUTURE) ×2 IMPLANT
SUT PDS AB 0 CT 36 (SUTURE) IMPLANT
SUT RETRIEVER GRASP 30 DEG (SUTURE) IMPLANT
SYR 20CC LL (SYRINGE) ×2 IMPLANT
TAPE PAPER 3X10 WHT MICROPORE (GAUZE/BANDAGES/DRESSINGS) ×2 IMPLANT
TOWEL OR 17X24 6PK STRL BLUE (TOWEL DISPOSABLE) ×2 IMPLANT
TOWEL OR 17X26 10 PK STRL BLUE (TOWEL DISPOSABLE) ×2 IMPLANT
WAND SUCTION MAX 4MM 90S (SURGICAL WAND) ×2 IMPLANT
WATER STERILE IRR 1000ML POUR (IV SOLUTION) ×2 IMPLANT

## 2012-07-29 NOTE — Op Note (Signed)
07/29/2012  3:03 PM  PATIENT:   [name]  52 y.o. male  PRE-OPERATIVE DIAGNOSIS:  LEFT SHOULDER ROTATOR CUFF TEAR  POST-OPERATIVE DIAGNOSIS:  Same with Impingement, labral tear and ac joint OA  PROCEDURE:  LSA, labral debridement, SAD, DCR, RCR  SURGEON:  Kahleb Mcclane, Vania Rea M.D.  ASSISTANTS: Shuford pac   ANESTHESIA:   GET + ISB  EBL: min  SPECIMEN:  none  Drains: none   PATIENT DISPOSITION:  PACU - hemodynamically stable.    PLAN OF CARE: Admit for overnight observation  Dictation# [id number]

## 2012-07-29 NOTE — Transfer of Care (Signed)
Immediate Anesthesia Transfer of Care Note  Patient: Jonanthan Bolender Visconti  Procedure(s) Performed: Procedure(s): LEFT SHOULDER ARTHROSCOPY WITH SUBACROMIAL DECOMPRESSION, DISTAL CLAVICLE RESECTION/ROTATOR CUFF REPAIR  (Left)  Patient Location: PACU  Anesthesia Type:General  Level of Consciousness: awake, alert  and patient cooperative  Airway & Oxygen Therapy: Patient Spontanous Breathing and Patient connected to face mask oxygen  Post-op Assessment: Report given to PACU RN, Post -op Vital signs reviewed and stable and Patient moving all extremities  Post vital signs: Reviewed and stable  Complications: No apparent anesthesia complications

## 2012-07-29 NOTE — Preoperative (Signed)
Beta Blockers   Reason not to administer Beta Blockers:Not Applicable 

## 2012-07-29 NOTE — Anesthesia Postprocedure Evaluation (Signed)
  Anesthesia Post-op Note  Patient: [name]  Procedure(s) Performed: Procedure(s): LEFT SHOULDER ARTHROSCOPY WITH SUBACROMIAL DECOMPRESSION, DISTAL CLAVICLE RESECTION/ROTATOR CUFF REPAIR  (Left)  Patient Location: PACU  Anesthesia Type:GA combined with regional for post-op pain  Level of Consciousness: awake and alert   Airway and Oxygen Therapy: Patient Spontanous Breathing and Patient connected to nasal cannula oxygen  Post-op Pain: none  Post-op Assessment: Post-op Vital signs reviewed, Patient's Cardiovascular Status Stable, Respiratory Function Stable, Patent Airway and No signs of Nausea or vomiting  Post-op Vital Signs: Reviewed and stable  Complications: No apparent anesthesia complications

## 2012-07-29 NOTE — Anesthesia Preprocedure Evaluation (Addendum)
Anesthesia Evaluation  Patient identified by MRN, date of birth, ID band Patient awake    Reviewed: Allergy & Precautions, H&P , NPO status , Patient's Chart, lab work & pertinent test results, reviewed documented beta blocker date and time   Airway Mallampati: II TM Distance: <3 FB Neck ROM: Limited    Dental  (+) Teeth Intact and Dental Advisory Given   Pulmonary sleep apnea ,  breath sounds clear to auscultation        Cardiovascular hypertension, Pt. on medications Rhythm:Regular Rate:Normal     Neuro/Psych Anxiety Depression    GI/Hepatic   Endo/Other    Renal/GU      Musculoskeletal   Abdominal (+) + obese,   Peds  Hematology   Anesthesia Other Findings   Reproductive/Obstetrics                         Anesthesia Physical Anesthesia Plan  ASA: III  Anesthesia Plan: General   Post-op Pain Management:    Induction: Intravenous  Airway Management Planned: Oral ETT and Video Laryngoscope Planned  Additional Equipment:   Intra-op Plan:   Post-operative Plan: Extubation in OR  Informed Consent: I have reviewed the patients History and Physical, chart, labs and discussed the procedure including the risks, benefits and alternatives for the proposed anesthesia with the patient or authorized representative who has indicated his/her understanding and acceptance.     Plan Discussed with: CRNA and Surgeon  Anesthesia Plan Comments:         Anesthesia Quick Evaluation

## 2012-07-29 NOTE — Anesthesia Procedure Notes (Addendum)
Anesthesia Regional Block:  Interscalene brachial plexus block  Pre-Anesthetic Checklist: ,, timeout performed, Correct Patient, Correct Site, Correct Laterality, Correct Procedure, Correct Position, site marked, Risks and benefits discussed,  Surgical consent,  Pre-op evaluation,  At surgeon's request and post-op pain management  Laterality: Left  Prep: chloraprep       Needles:  Injection technique: Single-shot  Needle Type: Echogenic Stimulator Needle     Needle Length: 5cm 5 cm Needle Gauge: 22 and 22 G    Additional Needles:  Procedures: ultrasound guided (picture in chart) and nerve stimulator Interscalene brachial plexus block  Nerve Stimulator or Paresthesia:  Response: 0.48 mA,   Additional Responses:   Narrative:  Start time: 07/29/2012 12:38 PM End time: 07/29/2012 12:50 PM Injection made incrementally with aspirations every 5 mL. Anesthesiologist: Dr Gypsy Balsam  Additional Notes: 4098-1191 L ISB POP CHG prep, steriletech #22 stim/echo needle w/stim down to .48ma and good [place] visualization with PIX in chart Marc .5% w/epi 1:200000 total 25cc+decadron 4mg  infiltrated Multiple neg asp No compl Dr Gypsy Balsam  Interscalene brachial plexus block Procedure Name: Intubation Date/Time: 07/29/2012 1:11 PM Performed by: Darcey Nora B Pre-anesthesia Checklist: Patient identified, Emergency Drugs available, Suction available and Patient being monitored Patient Re-evaluated:Patient Re-evaluated prior to inductionOxygen Delivery Method: Circle system utilized Preoxygenation: Pre-oxygenation with 100% oxygen Intubation Type: IV induction Ventilation: Mask ventilation without difficulty Laryngoscope size: glidescope large adult blade. Grade View: Grade II Tube type: Oral Tube size: 7.5 mm Number of attempts: 1 Airway Equipment and Method: Stylet and Video-laryngoscopy Placement Confirmation: ETT inserted through vocal cords under direct vision,  breath sounds checked- equal  and bilateral and positive ETCO2 Secured at: 22 (cm at teeth) cm Tube secured with: Tape Dental Injury: Teeth and Oropharynx as per pre-operative assessment

## 2012-07-29 NOTE — H&P (Signed)
Isac Sarna    Chief Complaint: LEFT SHOULDER ROTATOR CUFF TEAR HPI: The patient is a 52 y.o. male with massive retracted left shoulder rotator cuff tear  Past Medical History  Diagnosis Date  . Depression   . Anxiety   . Gout   . Hypertension   . Sleep apnea   . Bladder stone     Past Surgical History  Procedure Laterality Date  . Cervical fusion    . Hernia repair Right     inguinal  . Umbilical hernia repair      History reviewed. No pertinent family history.  Social History:  reports that he has never smoked. He does not have any smokeless tobacco history on file. He reports that he drinks about 3.6 ounces of alcohol per week. He reports that he does not use illicit drugs.  Allergies:  Allergies  Allergen Reactions  . Strawberry     Medications Prior to Admission  Medication Sig Dispense Refill  . allopurinol (ZYLOPRIM) 300 MG tablet Take 150 mg by mouth daily.       Marland Kitchen ALPRAZolam (XANAX) 0.5 MG tablet Take 0.5 mg by mouth daily as needed.       Marland Kitchen HYDROcodone-acetaminophen (NORCO/VICODIN) 5-325 MG per tablet Take 1 tablet by mouth every 4 (four) hours as needed. For pain      . LISINOPRIL PO Take 1 tablet by mouth daily.      . sertraline (ZOLOFT) 100 MG tablet Take 100 mg by mouth daily.         Physical Exam: left shoulder with painful and restricted ROM as noted at recent office visit  Vitals  Temp:  [97.6 F (36.4 C)] 97.6 F (36.4 C) (05/27 1131) Pulse Rate:  [58] 58 (05/27 1131) Resp:  [20] 20 (05/27 1131) BP: (136)/(88) 136/88 mmHg (05/27 1131) SpO2:  [96 %] 96 % (05/27 1131) Weight:  [111.131 kg (245 lb)] 111.131 kg (245 lb) (05/27 1127)  Assessment/Plan  Impression: LEFT SHOULDER ROTATOR CUFF TEAR  Plan of Action: Procedure(s): LEFT SHOULDER ARTHROSCOPY WITH SUBACROMIAL DECOMPRESSION, DISTAL CLAVICLE RESECTION/ROTATOR CUFF REPAIR   Franciso Dierks M 07/29/2012, 12:19 PM

## 2012-07-30 ENCOUNTER — Encounter (HOSPITAL_COMMUNITY): Payer: Self-pay | Admitting: Orthopedic Surgery

## 2012-07-30 MED ORDER — NAPROXEN 500 MG PO TABS: 500.0000 mg | ORAL_TABLET | Freq: Two times a day (BID) | ORAL | Status: DC

## 2012-07-30 MED ORDER — CYCLOBENZAPRINE HCL 10 MG PO TABS: 10.0000 mg | ORAL_TABLET | Freq: Three times a day (TID) | ORAL | Status: DC | PRN

## 2012-07-30 MED ORDER — OXYCODONE-ACETAMINOPHEN 5-325 MG PO TABS: 1.0000 | ORAL_TABLET | ORAL | Status: DC | PRN

## 2012-07-30 MED ORDER — TEMAZEPAM 30 MG PO CAPS: 30.0000 mg | ORAL_CAPSULE | Freq: Every evening | ORAL | Status: DC | PRN

## 2012-07-30 NOTE — Evaluation (Signed)
Occupational Therapy Evaluation Patient Details Name: [name] MRN: [id number] DOB: March 25, 1960 Today's Date: 07/30/2012 Time: 4696-2952 OT Time Calculation (min): 40 min  OT Assessment / Plan / Recommendation Clinical Impression   Pt and wife were instructed in precautions, sling wear and care, pendulums, positioning of UE when sleeping showering, neck ROM, AROM elbow, wrist, and hand.  Handout provided.  Wife was very standoffish and did not assist pt with UB ADLs, or sling, even when encouraged to do so.  She verbalizes nervousness about assisting, but when more instruction/practice were offered to her, she declined.  Pt states he feel confident with precautions and info. Provided, as well as their ability to safely manage ADLs.  Pt and family eager to discharge.     OT Assessment  All further OT needs can be met in the next venue of care    Follow Up Recommendations  Outpatient OT;Supervision - Intermittent (at MD discretion)    Barriers to Discharge      Equipment Recommendations  None recommended by OT    Recommendations for Other Services    Frequency       Precautions / Restrictions Precautions Precautions: Shoulder Type of Shoulder Precautions: OT for ADLs, sling education, pendulum exercises, ROM elbow, wrist and hand Precaution Booklet Issued: Yes (comment) Precaution Comments: shoulder handout Required Braces or Orthoses: Other Brace/Splint (sling) Restrictions Weight Bearing Restrictions: Yes LUE Weight Bearing: Non weight bearing       ADL       OT Diagnosis: Generalized weakness;Acute pain  OT Problem List: Decreased strength;Decreased range of motion;Decreased knowledge of use of DME or AE;Decreased knowledge of precautions;Pain;Impaired UE functional use OT Treatment Interventions:     OT Goals    Visit Information  Last OT Received On: 07/30/12 Assistance Needed: +1    Subjective Data  Subjective: "I'm just worried, I'm going to hurt him"  per  wife, but when asked if she wanted to practice further, or needed further instruction, she stated no. Patient Stated Goal: To go home; get back to work   Prior Functioning     Home Living Lives With: Spouse Available Help at Discharge: Family;Available 24 hours/day Type of Home: House Prior Function Level of Independence: Needs assistance Needs Assistance: Bathing;Dressing;Meal Prep;Light Housekeeping Bath: Minimal Dressing: Moderate Meal Prep: Maximal Light Housekeeping: Maximal Able to Take Stairs?: Yes Driving: Yes Vocation: Workers comp Comments: Pt works as a Curator.  Pt. reports he has required assistance with BADLs since injury Communication Communication: No difficulties Dominant Hand: Left         Vision/Perception     Cognition  Cognition Arousal/Alertness: Awake/alert Behavior During Therapy: WFL for tasks assessed/performed Overall Cognitive Status: Within Functional Limits for tasks assessed    Extremity/Trunk Assessment Right Upper Extremity Assessment RUE ROM/Strength/Tone: Within functional levels RUE Coordination: WFL - gross/fine motor Left Upper Extremity Assessment LUE ROM/Strength/Tone: Deficits;Due to precautions LUE Sensation: Deficits LUE Sensation Deficits: block has not worn off LUE Coordination: Deficits     Mobility Bed Mobility Bed Mobility: Supine to Sit;Sitting - Scoot to Edge of Bed Supine to Sit: 6: Modified independent (Device/Increase time) Sitting - Scoot to Edge of Bed: 6: Modified independent (Device/Increase time) Transfers Transfers: Sit to Stand;Stand to Sit Sit to Stand: 6: Modified independent (Device/Increase time) Stand to Sit: 6: Modified independent (Device/Increase time)     Exercise Shoulder Exercises Pendulum Exercise: Left;10 reps;Standing Elbow Flexion: Left;AAROM;10 reps;Standing Elbow Extension: AAROM;10 reps;Left;Standing Wrist Flexion: Left;AROM;10 reps;Standing Wrist Extension: AROM;Left;10  reps;Standing Digit  Composite Flexion: AROM;Left;10 reps;Standing Composite Extension: AROM;Left;10 reps;Standing Neck Flexion: AROM;5 reps;Standing Neck Extension: AROM;5 reps;Standing Neck Lateral Flexion - Right: AROM;5 reps;Standing Neck Lateral Flexion - Left: AROM;5 reps;Standing Donning/doffing shirt without moving shoulder: Supervision/safety (caregiver observed) Method for sponge bathing under operated UE: Supervision/safety (caregiver observed) Donning/doffing sling/immobilizer: Moderate assistance (caregiver observed) Correct positioning of sling/immobilizer: Minimal assistance (caregiver observed) Pendulum exercises (written home exercise program): Supervision/safety ROM for elbow, wrist and digits of operated UE: Supervision/safety (caregiver observed) Sling wearing schedule (on at all times/off for ADL's): Independent Proper positioning of operated UE when showering: Supervision/safety Positioning of UE while sleeping: Supervision/safety   Balance     End of Session OT - End of Session Activity Tolerance: Patient tolerated treatment well Patient left: in bed;with call bell/phone within reach;with family/visitor present Nurse Communication: Other (comment) (ready for discharge)  GO Functional Limitation: Self care Self Care Current Status (W0981): At least 40 percent but less than 60 percent impaired, limited or restricted Self Care Goal Status (X9147): At least 40 percent but less than 60 percent impaired, limited or restricted Self Care Discharge Status [phone]): At least 40 percent but less than 60 percent impaired, limited or restricted   Amahia Madonia M 07/30/2012, 12:43 PM

## 2012-07-30 NOTE — Op Note (Signed)
[name], [name] NO.:  [id number]  MEDICAL RECORD NO.:  [id number]  LOCATION:  5N29C                        FACILITY:  [facility]  PHYSICIAN:  Vania Rea. Asmar Brozek, M.D.  DATE OF BIRTH:  Aug 19, 1960  DATE OF PROCEDURE:  07/29/2012 DATE OF DISCHARGE:                              OPERATIVE REPORT   PREOPERATIVE DIAGNOSES: 1. Massive retracted left shoulder rotator cuff tears. 2. Left shoulder acromioclavicular joint arthrosis. 3. Left shoulder revision.  POSTOPERATIVE DIAGNOSES: 1. Massive retracted left shoulder rotator cuff tears. 2. Left shoulder acromioclavicular joint arthrosis. 3. Left shoulder revision. 4. Extensive labral tear.  PROCEDURE: 1. Left shoulder examination under anesthesia. 2. Left shoulder diagnostic arthroscopy. 3. Debridement of complex and extensive labral tear. 4. Arthroscopic subacromial decompression and bursectomy. 5. Arthroscopic distal clavicle resection. 6. Arthroscopic rotator cuff repair of a massive tear 5 cm in width     using a double row suture bridge repair.  SURGEON:  Vania Rea. Kimie Pidcock, M.D.  Threasa HeadsFrench Ana A. Shuford, PA-C  ANESTHESIA:  LMA general as well as an interscalene block.  ESTIMATED BLOOD LOSS:  Minimal.  DRAINS:  None.  HISTORY:  [name] is a 52 year old gentleman who had a work-related injury to his left shoulder felt a "tearing" with immediate complaints of pain.  Subsequently, he has had persistent weakness and functional limitations with an MRI scan showing a large retracted tear of the rotator cuff.  He was brought to the operating room at this time for planned left shoulder arthroscopy as described below.  Preoperatively, I counseled [name] on treatment options as well as risks versus benefits thereof.  Possible surgical complications were reviewed including the potential for bleeding, infection, neurovascular injury, persistent pain, loss of motion, anesthetic  complication, recurrence, rotator cuff tear, and possible need for additional surgery. He understands and accepts and agrees to planned procedure.  PROCEDURE IN DETAIL:  After undergoing routine preop evaluation, the patient received prophylactic antibiotics.  An interscalene block was established in the holding area of the Anesthesia Department.  Placed supine on the operating table, underwent smooth induction of a general endotracheal anesthesia.  Turned to the right lateral decubitus position on a beanbag and appropriately padded and protected.  Left shoulder examination under anesthesia revealed full motion.  No instability patterns noted.  Left arm was then suspended at 70 degrees of abduction with 10 pounds of traction.  Left shoulder girdle region was sterilely prepped and draped in standard fashion.  Time-out was called.  Posterior portal sites with bunion joint and portal sites under direct visualization.  Articular surfaces were in good condition.  An obvious large retracted tear of the entire supra and infraspinatus extending into the sub upper teres minor extending anteriorly to the upper subscap, but the biceps was stable distally.  Biceps anchor was stable proximally.  There was extensive tearing of the anterior row superior aspects of the labrum.  These were all debrided with a shaver to a stable margin.  Articular surfaces were in good condition.  At this time, fluid was removed from the glenohumeral joint.  Arm was dropped down to 30 degrees of abduction with the arthroscope into the subacromial space.  The posterior portal and a direct lateral portal established in the subacromial space.  Abundant dense bursal tissue with multiple adhesions were encountered and these were all divided and excised with a combination of shaver and a Stryker wand.  The wand was then used to remove the periosteum from the undersurface of the anterior half of the acromion and then a  subacromial decompression was performed with a bur creating type 1 morphology.  Portal was then established directly anterior to the distal clavicle and the distal clavicle resection was performed with a bur.  Care was taken to confirm visualization of the entire circumference of the distal clavicle to ensure adequate removal of the bone.  Approximately, 8 mm was removed. I then completed the subacromial/subdeltoid bursectomy.  Also mobilized the rotator cuff on both articular and bursal sides dividing adhesions and confirmed the rotator cuff to be brought back to the tuberosity. The footprint of the tuberosity was then cleaned of soft tissue and bone gently abraded approximately 5 cm wide footprint.  Accessory portal __________ lateral margin of the acromion, placed 2 Arthrex peak corkscrew suture anchors.  The limbs of the suture anchor were then passed equidistant across the width of the tear in a horizontal mattress pattern and we did use 2 corkscrews, a total of 8 suture limbs were passed and these were all then tied with sliding locking knots followed by multiple overhand throws and alternating posts.  We then created "suture bridge" with 2 swivel lock suture anchors nicely reinforcing on repair and compressing the margin of the rotator cuff against bony bed of the tuberosity.  Suture limbs were all clipped.  Overall, construct was much to our satisfaction.  Final bursectomy was completed.  [name], PA-C was used as an Geophysicist/field seismologist throughout this case essential for help with positioning of the extremity, management of hypochromic tissue manipulation, wound closer and intraoperative decision making.  The portals were closed with Monocryl and Steri- Strips.  A dry dressing taped at the left shoulder.  Left arm was placed in a sling.  The patient was awakened, extubated, and taken to recovery room in stable condition.     Vania Rea. Shan Padgett, M.D.     KMS/MEDQ  D:  07/29/2012   T:  07/30/2012  Job:  [id number]

## 2012-07-30 NOTE — Discharge Summary (Signed)
PATIENT ID:      [name]  MRN:     [id number] DOB/AGE:    1960/07/31 / 52 y.o.     DISCHARGE SUMMARY  ADMISSION DATE:    07/29/2012 DISCHARGE DATE:  07/30/2012  ADMISSION DIAGNOSIS: LEFT SHOULDER ROTATOR CUFF TEAR Past Medical History  Diagnosis Date  . Depression   . Anxiety   . Gout   . Hypertension   . Sleep apnea   . Bladder stone     DISCHARGE DIAGNOSIS:   Active Problems:   * No active hospital problems. *   PROCEDURE: Procedure(s): LEFT SHOULDER ARTHROSCOPY WITH SUBACROMIAL DECOMPRESSION, DISTAL CLAVICLE RESECTION/ROTATOR CUFF REPAIR  on 07/29/2012  CONSULTS:   none  HISTORY:  See H&P in chart.  HOSPITAL COURSE:  [name] is a 52 y.o. admitted on 07/29/2012 with a chief complaint of left shoulder pain following injury at work, and found to have a diagnosis of LEFT SHOULDER ROTATOR CUFF TEAR.  They were brought to the operating room on 07/29/2012 and underwent Procedure(s): LEFT SHOULDER ARTHROSCOPY WITH SUBACROMIAL DECOMPRESSION, DISTAL CLAVICLE RESECTION/ROTATOR CUFF REPAIR .    They were given perioperative antibiotics: Anti-infectives   None    .  Patient underwent the above named procedure and tolerated it well. He was kept for observation via sleep apnea protocol The following day they were hemodynamically stable and pain was controlled on oral analgesics. They were neurovascularly intact to the operative extremity. OT was ordered and worked with patient per protocol. They were medically and orthopaedically stable for discharge on .     DIAGNOSTIC STUDIES:  RECENT RADIOGRAPHIC STUDIES :  Dg Chest 2 View  07/29/2012   *RADIOLOGY REPORT*  Clinical Data: Preop shoulder surgery.  CHEST - 2 VIEW  Comparison: None.  Findings: Heart and mediastinal contours are within normal limits. No focal opacities or effusions.  No acute bony abnormality.  IMPRESSION: No active cardiopulmonary disease.   Original Report Authenticated By: Charlett Nose, M.D.    RECENT  VITAL SIGNS:  Patient Vitals for the past 24 hrs:  BP Temp Temp src Pulse Resp SpO2 Height Weight  07/30/12 0518 118/77 mmHg 97.7 F (36.5 C) - 84 18 95 % - -  07/29/12 2225 118/74 mmHg 98.3 F (36.8 C) - 89 16 95 % - -  07/29/12 1630 135/84 mmHg 98 F (36.7 C) - 57 12 100 % - -  07/29/12 1615 129/80 mmHg - - 58 14 100 % - -  07/29/12 1600 141/91 mmHg - - 66 16 100 % - -  07/29/12 1545 153/98 mmHg - - 61 15 100 % - -  07/29/12 1530 161/94 mmHg 97.7 F (36.5 C) - 75 14 100 % - -  07/29/12 1131 136/88 mmHg 97.6 F (36.4 C) Oral 58 20 96 % - -  07/29/12 1127 - - - - - - 5\' 7"  (1.702 m) 111.131 kg (245 lb)  .  RECENT EKG RESULTS:    Orders placed during the hospital encounter of 10/17/11  . ED EKG  . ED EKG  . EKG    DISCHARGE INSTRUCTIONS:  Discharge Orders   Future Orders Complete By Expires     Change dressing  As directed        DISCHARGE MEDICATIONS:     Medication List    TAKE these medications       allopurinol 300 MG tablet  Commonly known as:  ZYLOPRIM  Take 150 mg by mouth daily.  ALPRAZolam 0.5 MG tablet  Commonly known as:  XANAX  Take 0.5 mg by mouth daily as needed.     cyclobenzaprine 10 MG tablet  Commonly known as:  FLEXERIL  Take 1 tablet (10 mg total) by mouth 3 (three) times daily as needed for muscle spasms.     HYDROcodone-acetaminophen 5-325 MG per tablet  Commonly known as:  NORCO/VICODIN  Take 1 tablet by mouth every 4 (four) hours as needed. For pain     LISINOPRIL PO  Take 1 tablet by mouth daily.     naproxen 500 MG tablet  Commonly known as:  NAPROSYN  Take 1 tablet (500 mg total) by mouth 2 (two) times daily with a meal.     oxyCODONE-acetaminophen 5-325 MG per tablet  Commonly known as:  PERCOCET  Take 1-2 tablets by mouth every 4 (four) hours as needed for pain.     sertraline 100 MG tablet  Commonly known as:  ZOLOFT  Take 100 mg by mouth daily.     temazepam 30 MG capsule  Commonly known as:  RESTORIL  Take 1  capsule (30 mg total) by mouth at bedtime as needed for sleep.        FOLLOW UP VISIT:       Follow-up Information   Follow up with SUPPLE,KEVIN M, MD. (call to be seen in 7-10 days)    Contact information:   [address], [address] [place] [address] [phone]       DISCHARGE TO: Home  DISPOSITION:  Good  DISCHARGE CONDITION:  Good   Grey Rakestraw for Dr. Francena Hanly 07/30/2012, 8:03 AM

## 2013-01-01 ENCOUNTER — Other Ambulatory Visit: Payer: Self-pay | Admitting: Orthopedic Surgery

## 2013-01-01 DIAGNOSIS — M25512 Pain in left shoulder: Secondary | ICD-10-CM

## 2013-01-15 ENCOUNTER — Ambulatory Visit
Admission: RE | Admit: 2013-01-15 | Discharge: 2013-01-15 | Disposition: A | Discharge status: Home / Self Care | Payer: Worker's Compensation | Source: Ambulatory Visit | Attending: Orthopedic Surgery | Admitting: Orthopedic Surgery

## 2013-01-15 DIAGNOSIS — M25512 Pain in left shoulder: Secondary | ICD-10-CM

## 2013-01-15 MED ORDER — IOHEXOL 180 MG/ML  SOLN: 15.0000 mL | Freq: Once | INTRAMUSCULAR | Status: DC | PRN

## 2015-08-14 ENCOUNTER — Emergency Department (HOSPITAL_COMMUNITY)
Admission: EM | Admit: 2015-08-14 | Discharge: 2015-08-14 | Disposition: A | Discharge status: Home / Self Care | Payer: Self-pay | Attending: Emergency Medicine | Admitting: Emergency Medicine

## 2015-08-14 ENCOUNTER — Encounter (HOSPITAL_COMMUNITY): Payer: Self-pay | Admitting: *Deleted

## 2015-08-14 DIAGNOSIS — F329 Major depressive disorder, single episode, unspecified: Secondary | ICD-10-CM | POA: Insufficient documentation

## 2015-08-14 DIAGNOSIS — Z79899 Other long term (current) drug therapy: Secondary | ICD-10-CM | POA: Insufficient documentation

## 2015-08-14 DIAGNOSIS — Y939 Activity, unspecified: Secondary | ICD-10-CM | POA: Insufficient documentation

## 2015-08-14 DIAGNOSIS — W57XXXA Bitten or stung by nonvenomous insect and other nonvenomous arthropods, initial encounter: Secondary | ICD-10-CM | POA: Insufficient documentation

## 2015-08-14 DIAGNOSIS — I10 Essential (primary) hypertension: Secondary | ICD-10-CM | POA: Insufficient documentation

## 2015-08-14 DIAGNOSIS — Y999 Unspecified external cause status: Secondary | ICD-10-CM | POA: Insufficient documentation

## 2015-08-14 DIAGNOSIS — Y929 Unspecified place or not applicable: Secondary | ICD-10-CM | POA: Insufficient documentation

## 2015-08-14 DIAGNOSIS — Z791 Long term (current) use of non-steroidal anti-inflammatories (NSAID): Secondary | ICD-10-CM | POA: Insufficient documentation

## 2015-08-14 DIAGNOSIS — S80862A Insect bite (nonvenomous), left lower leg, initial encounter: Secondary | ICD-10-CM | POA: Insufficient documentation

## 2015-08-14 MED ORDER — CEPHALEXIN 500 MG PO CAPS
500.0000 mg | ORAL_CAPSULE | Freq: Once | ORAL | Status: AC
  Administered 2015-08-14: 500 mg via ORAL
  Filled 2015-08-14: qty 1

## 2015-08-14 MED ORDER — CEPHALEXIN 500 MG PO CAPS: 500.0000 mg | ORAL_CAPSULE | Freq: Three times a day (TID) | ORAL | Status: DC

## 2015-08-14 NOTE — ED Notes (Addendum)
Pt states he was bit by something Friday night on the front part of his lower leg and it looked like a mosquito bite. Pt now has a reddened area about the size of a fifty cent piece and c/o it itching but no pain. Pt also c/o dry mouth.

## 2015-08-18 NOTE — ED Provider Notes (Signed)
CSN: [id number]     Arrival date & time 08/14/15  1905 History   First MD Initiated Contact with Patient 08/14/15 2101     Chief Complaint  Patient presents with  . Insect Bite     (Consider location/radiation/quality/duration/timing/severity/associated sxs/prior Treatment) HPI   [name] is a 55 y.o. male who presents to the Emergency Department complaining of insect bite to his left lower leg near the ankle.  He states that he felt something bite him, and shortly after noticed a small red area that has since gotten larger in size.  He reports "soreness" to the area that is worse with weight bearing and ankle movement.  He denies trauma, numbness or weakness, swelling and drainage.     Past Medical History  Diagnosis Date  . Depression   . Anxiety   . Gout   . Hypertension   . Sleep apnea   . Bladder stone    Past Surgical History  Procedure Laterality Date  . Cervical fusion    . Hernia repair Right     inguinal  . Umbilical hernia repair    . Shoulder arthroscopy with rotator cuff repair and subacromial decompression Left 07/29/2012    Procedure: LEFT SHOULDER ARTHROSCOPY WITH SUBACROMIAL DECOMPRESSION, DISTAL CLAVICLE RESECTION/ROTATOR CUFF REPAIR ;  Surgeon: Senaida LangeKevin M Supple, MD;  Location: [facility] OR;  Service: Orthopedics;  Laterality: Left;  . Shoulder surgery     History reviewed. No pertinent family history. Social History  Substance Use Topics  . Smoking status: Never Smoker   . Smokeless tobacco: None  . Alcohol Use: 3.6 oz/week    6 Cans of beer per week     Comment: 3 x /wk    Review of Systems  Constitutional: Negative for fever and chills.  Gastrointestinal: Negative for nausea and vomiting.  Musculoskeletal: Negative for joint swelling and arthralgias.  Skin: Positive for color change.       Insect bite left lower leg  Hematological: Negative for adenopathy.  All other systems reviewed and are negative.     Allergies  Kiwi extract and  Strawberry extract  Home Medications   Prior to Admission medications   Medication Sig Start Date End Date Taking? Authorizing Provider  allopurinol (ZYLOPRIM) 300 MG tablet Take 300 mg by mouth daily.    Yes Historical Provider, MD  ALPRAZolam Prudy Feeler(XANAX) 0.5 MG tablet Take 0.5 mg by mouth daily as needed for anxiety or sleep.    Yes Historical Provider, MD  ibuprofen (ADVIL,MOTRIN) 200 MG tablet Take 800 mg by mouth every 6 (six) hours as needed for mild pain or moderate pain.   Yes Historical Provider, MD  LISINOPRIL PO Take 1 tablet by mouth daily.   Yes Historical Provider, MD  sertraline (ZOLOFT) 100 MG tablet Take 100 mg by mouth daily.   Yes Historical Provider, MD  UNKNOWN TO PATIENT Take 1 tablet by mouth daily. Blood pressure   Yes Historical Provider, MD  UNKNOWN TO PATIENT Take 1 tablet by mouth daily. Cholesterol   Yes Historical Provider, MD  cephALEXin (KEFLEX) 500 MG capsule Take 1 capsule (500 mg total) by mouth 3 (three) times daily. For 7 days 08/14/15   Aydden Cumpian, PA-C   BP 163/92 mmHg  Pulse 78  Temp(Src) 98.6 F (37 C) (Oral)  Resp 20  Ht 5\' 7"  (1.702 m)  Wt 111.131 kg  BMI 38.36 kg/m2  SpO2 97% Physical Exam  Constitutional: He is oriented to person, place, and time. He appears  well-developed and well-nourished. No distress.  HENT:  Head: Normocephalic and atraumatic.  Cardiovascular: Normal rate, regular rhythm and normal heart sounds.   No murmur heard. Pulmonary/Chest: Effort normal and breath sounds normal. No respiratory distress.  Neurological: He is alert and oriented to person, place, and time. He exhibits normal muscle tone. Coordination normal.  Skin: Skin is warm and dry. There is erythema.  3 cm erythematous maculopapular region to the distal left lower leg. No edema, sensation intact  Nursing note and vitals reviewed.   ED Course  Procedures (including critical care time) Labs Review Labs Reviewed - No data to display  Imaging Review No  results found. I have personally reviewed and evaluated these images and lab results as part of my medical decision-making.   EKG Interpretation None      MDM   Final diagnoses:  Insect bite   Pt well appearing.  Vitals stable.  Likely localized reaction to insect bite. No abscess.  He agrees to elevate, warm compresses and keflex. He appears stable for d/c and agrees to PMD f/u     Pauline Aus, PA-C 08/18/15 0039  Bethann Berkshire, MD 08/24/15 [phone]

## 2015-08-19 ENCOUNTER — Emergency Department (HOSPITAL_COMMUNITY): Payer: Self-pay

## 2015-08-19 ENCOUNTER — Emergency Department (HOSPITAL_COMMUNITY)
Admission: EM | Admit: 2015-08-19 | Discharge: 2015-08-19 | Disposition: A | Discharge status: Home / Self Care | Payer: Self-pay | Attending: Emergency Medicine | Admitting: Emergency Medicine

## 2015-08-19 ENCOUNTER — Encounter (HOSPITAL_COMMUNITY): Payer: Self-pay

## 2015-08-19 DIAGNOSIS — Z791 Long term (current) use of non-steroidal anti-inflammatories (NSAID): Secondary | ICD-10-CM | POA: Insufficient documentation

## 2015-08-19 DIAGNOSIS — S90562A Insect bite (nonvenomous), left ankle, initial encounter: Secondary | ICD-10-CM | POA: Insufficient documentation

## 2015-08-19 DIAGNOSIS — W57XXXA Bitten or stung by nonvenomous insect and other nonvenomous arthropods, initial encounter: Secondary | ICD-10-CM | POA: Insufficient documentation

## 2015-08-19 DIAGNOSIS — Y939 Activity, unspecified: Secondary | ICD-10-CM | POA: Insufficient documentation

## 2015-08-19 DIAGNOSIS — Y929 Unspecified place or not applicable: Secondary | ICD-10-CM | POA: Insufficient documentation

## 2015-08-19 DIAGNOSIS — I10 Essential (primary) hypertension: Secondary | ICD-10-CM | POA: Insufficient documentation

## 2015-08-19 DIAGNOSIS — F329 Major depressive disorder, single episode, unspecified: Secondary | ICD-10-CM | POA: Insufficient documentation

## 2015-08-19 DIAGNOSIS — Y999 Unspecified external cause status: Secondary | ICD-10-CM | POA: Insufficient documentation

## 2015-08-19 LAB — CBC WITH DIFFERENTIAL/PLATELET
BASOS PCT: 0 %
Basophils Absolute: 0 10*3/uL (ref 0.0–0.1)
EOS ABS: 0.2 10*3/uL (ref 0.0–0.7)
Eosinophils Relative: 2 %
HCT: 43.7 % (ref 39.0–52.0)
HEMOGLOBIN: 15.5 g/dL (ref 13.0–17.0)
Lymphocytes Relative: 40 %
Lymphs Abs: 2.9 10*3/uL (ref 0.7–4.0)
MCH: 30.7 pg (ref 26.0–34.0)
MCHC: 35.5 g/dL (ref 30.0–36.0)
MCV: 86.5 fL (ref 78.0–100.0)
Monocytes Absolute: 0.7 10*3/uL (ref 0.1–1.0)
Monocytes Relative: 10 %
NEUTROS PCT: 48 %
Neutro Abs: 3.4 10*3/uL (ref 1.7–7.7)
Platelets: 170 10*3/uL (ref 150–400)
RBC: 5.05 MIL/uL (ref 4.22–5.81)
RDW: 12.8 % (ref 11.5–15.5)
WBC: 7.2 10*3/uL (ref 4.0–10.5)

## 2015-08-19 LAB — BASIC METABOLIC PANEL
Anion gap: 6 (ref 5–15)
BUN: 12 mg/dL (ref 6–20)
CO2: 27 mmol/L (ref 22–32)
CREATININE: 0.97 mg/dL (ref 0.61–1.24)
Calcium: 8.7 mg/dL — ABNORMAL LOW (ref 8.9–10.3)
Chloride: 105 mmol/L (ref 101–111)
GFR calc non Af Amer: >60 mL/min (ref >60)
Glucose, Bld: 94 mg/dL (ref 65–99)
POTASSIUM: 3.6 mmol/L (ref 3.5–5.1)
SODIUM: 138 mmol/L (ref 135–145)

## 2015-08-19 MED ORDER — METHYLPREDNISOLONE SODIUM SUCC 125 MG IJ SOLR
125.0000 mg | Freq: Once | INTRAMUSCULAR | Status: AC
  Administered 2015-08-19: 125 mg via INTRAVENOUS
  Filled 2015-08-19: qty 2

## 2015-08-19 MED ORDER — DOXYCYCLINE HYCLATE 100 MG PO TABS
100.0000 mg | ORAL_TABLET | Freq: Once | ORAL | Status: AC
  Administered 2015-08-19: 100 mg via ORAL
  Filled 2015-08-19: qty 1

## 2015-08-19 MED ORDER — ONDANSETRON HCL 4 MG PO TABS
4.0000 mg | ORAL_TABLET | Freq: Once | ORAL | Status: AC
  Administered 2015-08-19: 4 mg via ORAL
  Filled 2015-08-19: qty 1

## 2015-08-19 MED ORDER — DOXYCYCLINE HYCLATE 100 MG PO CAPS: 100.0000 mg | ORAL_CAPSULE | Freq: Two times a day (BID) | ORAL | Status: DC

## 2015-08-19 MED ORDER — DEXAMETHASONE 4 MG PO TABS: 4.0000 mg | ORAL_TABLET | Freq: Two times a day (BID) | ORAL | Status: DC

## 2015-08-19 MED ORDER — DEXTROSE 5 % IV SOLN
1.0000 g | Freq: Once | INTRAVENOUS | Status: AC
  Administered 2015-08-19: 1 g via INTRAVENOUS
  Filled 2015-08-19: qty 10

## 2015-08-19 NOTE — Discharge Instructions (Signed)
Please keep your foot elevated above your waist. Use Decadron 2 times daily, use doxycycline 2 times daily with a meal. Please see Dr. Reuel Boomaniel or return to the emergency department if any high fever, red streaking, or changes in your general condition.

## 2015-08-19 NOTE — ED Notes (Signed)
Pt reports was here Sunday for a spider bite on left foot.  Reports has been taking antibiotics and thought it was getting better but pt reports nausea, decreased appetite, and low grade fever.

## 2015-08-19 NOTE — ED Provider Notes (Signed)
CSN: [id number]     Arrival date & time 08/19/15  1016 History   First MD Initiated Contact with Patient 08/19/15 1237     Chief Complaint  Patient presents with  . Wound Check     (Consider location/radiation/quality/duration/timing/severity/associated sxs/prior Treatment) HPI Comments: Patient is a 55 year old male who presents to the emergency department with a complaint of my wound is getting bigger.  Patient states that he sustained an insect bite on his left lower leg at the ankle area about 5 or 6 days ago. He was seen in the emergency department on Sunday, June 11. At that time the patient was treated with warm compresses and Keflex. The patient states that it seems as though the area was getting better, but then started getting worse with increased redness increased warmth and increased in size. The patient was fearful that he may have sustained a spider bite and came to the emergency department for recheck and evaluation. There was no fever or chills reported. Is no drainage from the site.  Patient is a 55 y.o. male presenting with wound check. The history is provided by the patient.  Wound Check The current episode started in the past 7 days.    Past Medical History  Diagnosis Date  . Depression   . Anxiety   . Gout   . Hypertension   . Sleep apnea   . Bladder stone    Past Surgical History  Procedure Laterality Date  . Cervical fusion    . Hernia repair Right     inguinal  . Umbilical hernia repair    . Shoulder arthroscopy with rotator cuff repair and subacromial decompression Left 07/29/2012    Procedure: LEFT SHOULDER ARTHROSCOPY WITH SUBACROMIAL DECOMPRESSION, DISTAL CLAVICLE RESECTION/ROTATOR CUFF REPAIR ;  Surgeon: Senaida Lange, MD;  Location: [facility] OR;  Service: Orthopedics;  Laterality: Left;  . Shoulder surgery     No family history on file. Social History  Substance Use Topics  . Smoking status: Never Smoker   . Smokeless tobacco: None  . Alcohol Use:  3.6 oz/week    6 Cans of beer per week     Comment: once a week    Review of Systems  Skin: Positive for wound.  Psychiatric/Behavioral: The patient is nervous/anxious.   All other systems reviewed and are negative.     Allergies  Kiwi extract and Strawberry extract  Home Medications   Prior to Admission medications   Medication Sig Start Date End Date Taking? Authorizing Provider  allopurinol (ZYLOPRIM) 300 MG tablet Take 300 mg by mouth daily.     Historical Provider, MD  ALPRAZolam Prudy Feeler) 0.5 MG tablet Take 0.5 mg by mouth daily as needed for anxiety or sleep.     Historical Provider, MD  cephALEXin (KEFLEX) 500 MG capsule Take 1 capsule (500 mg total) by mouth 3 (three) times daily. For 7 days 08/14/15   Tammy Triplett, PA-C  ibuprofen (ADVIL,MOTRIN) 200 MG tablet Take 800 mg by mouth every 6 (six) hours as needed for mild pain or moderate pain.    Historical Provider, MD  LISINOPRIL PO Take 1 tablet by mouth daily.    Historical Provider, MD  sertraline (ZOLOFT) 100 MG tablet Take 100 mg by mouth daily.    Historical Provider, MD  UNKNOWN TO PATIENT Take 1 tablet by mouth daily. Blood pressure    Historical Provider, MD  UNKNOWN TO PATIENT Take 1 tablet by mouth daily. Cholesterol    Historical Provider, MD  BP 149/92 mmHg  Pulse 62  Temp(Src) 98.7 F (37.1 C) (Oral)  Resp 20  Ht 5\' 7"  (1.702 m)  Wt 111.131 kg  BMI 38.36 kg/m2  SpO2 98% Physical Exam  Constitutional: He is oriented to person, place, and time. He appears well-developed and well-nourished.  Non-toxic appearance.  HENT:  Head: Normocephalic.  Right Ear: Tympanic membrane and external ear normal.  Left Ear: Tympanic membrane and external ear normal.  Eyes: EOM and lids are normal. Pupils are equal, round, and reactive to light.  Neck: Normal range of motion. Neck supple. Carotid bruit is not present.  Cardiovascular: Normal rate, regular rhythm, normal heart sounds, intact distal pulses and normal  pulses.   Pulmonary/Chest: Breath sounds normal. No respiratory distress.  Abdominal: Soft. Bowel sounds are normal. There is no tenderness. There is no guarding.  Musculoskeletal: Normal range of motion.  There is an area of increased redness of the dorsum of the left ankle extending toward the dorsum of the foot. The area is sore but not painful. The area is warm to touch. There is no red streaks appreciated. The Achilles tendon is intact. The has full range of motion of the toes. There no lesions between the toes. There no puncture wounds noted on the plantar surface of the foot. Dorsalis pedis pulses 2+.  Lymphadenopathy:       Head (right side): No submandibular adenopathy present.       Head (left side): No submandibular adenopathy present.    He has no cervical adenopathy.  Neurological: He is alert and oriented to person, place, and time. He has normal strength. No cranial nerve deficit or sensory deficit.  Skin: Skin is warm and dry.  Psychiatric: He has a normal mood and affect. His speech is normal.  Nursing note and vitals reviewed.   ED Course  Procedures (including critical care time) Labs Review Labs Reviewed  BASIC METABOLIC PANEL - Abnormal; Notable for the following:    Calcium 8.7 (*)    All other components within normal limits  CBC WITH DIFFERENTIAL/PLATELET    Imaging Review Dg Ankle Complete Left  08/19/2015  CLINICAL DATA:  Status post spider bite on the left ankle 08/14/2015. Continued pain and swelling. EXAM: LEFT ANKLE COMPLETE - 3+ VIEW COMPARISON:  None. FINDINGS: No acute bony or joint abnormality is seen. No tibiotalar joint effusion is identified. Small plantar calcaneal spur is noted. No soft tissue gas collection or radiopaque foreign body is identified. IMPRESSION: No acute abnormality. Electronically Signed   By: Drusilla Kannerhomas  Dalessio M.D.   On: 08/19/2015 13:09   I have personally reviewed and evaluated these images and lab results as part of my medical  decision-making.   EKG Interpretation None      MDM  Left ankle x-ray is negative for any gas collection. An negative for any foreign body appreciated. Is no foreign body noted of the foot.  Complete blood count is well within normal limits. Basic metabolic panels well within normal limits. Vital signs are well within normal limits.  Patient treated in the emergency department with site of Medrol and IV Rocephin. Prescription for Decadron and doxycycline given to the patient. I've asked the patient to keep his foot elevated as much as possible. He is to return if the increase area of redness is not improving. Patient acknowledges understanding of this discharge instruction.    Final diagnoses:  None    **I have reviewed nursing notes, vital signs, and all appropriate lab and  imaging results for this patient.Ivery Quale, PA-C 08/19/15 1557  Donnetta Hutching, MD 08/20/15 [phone]

## 2016-09-13 ENCOUNTER — Emergency Department (HOSPITAL_COMMUNITY)
Admission: EM | Admit: 2016-09-13 | Discharge: 2016-09-13 | Disposition: A | Discharge status: Home / Self Care | Payer: Self-pay | Attending: Emergency Medicine | Admitting: Emergency Medicine

## 2016-09-13 ENCOUNTER — Encounter (HOSPITAL_COMMUNITY): Payer: Self-pay | Admitting: *Deleted

## 2016-09-13 ENCOUNTER — Emergency Department (HOSPITAL_COMMUNITY): Payer: Self-pay

## 2016-09-13 DIAGNOSIS — IMO0002 Reserved for concepts with insufficient information to code with codable children: Secondary | ICD-10-CM

## 2016-09-13 DIAGNOSIS — I10 Essential (primary) hypertension: Secondary | ICD-10-CM | POA: Insufficient documentation

## 2016-09-13 DIAGNOSIS — Z79899 Other long term (current) drug therapy: Secondary | ICD-10-CM | POA: Insufficient documentation

## 2016-09-13 DIAGNOSIS — M519 Unspecified thoracic, thoracolumbar and lumbosacral intervertebral disc disorder: Secondary | ICD-10-CM | POA: Insufficient documentation

## 2016-09-13 MED ORDER — PREDNISONE 10 MG PO TABS: ORAL_TABLET | ORAL | 0 refills | Status: DC

## 2016-09-13 MED ORDER — OXYCODONE-ACETAMINOPHEN 5-325 MG PO TABS: 2.0000 | ORAL_TABLET | ORAL | 0 refills | Status: DC | PRN

## 2016-09-13 MED ORDER — PREDNISONE 50 MG PO TABS
60.0000 mg | ORAL_TABLET | Freq: Once | ORAL | Status: AC
  Administered 2016-09-13: 60 mg via ORAL
  Filled 2016-09-13: qty 1

## 2016-09-13 NOTE — ED Provider Notes (Signed)
[facility]-EMERGENCY DEPT Provider Note   CSN: [id number] Arrival date & time: 09/13/16  1153     History   Chief Complaint Chief Complaint  Patient presents with  . Neck Pain    HPI [name] is a 56 y.o. male.  The history is provided by the patient. No language interpreter was used.  Neck Pain   This is a new problem. The current episode started 2 days ago. The problem occurs constantly. The problem has been gradually worsening. The pain is associated with nothing. There has been no fever. The pain is present in the generalized neck. The pain is moderate. The pain is the same all the time. Associated symptoms include numbness. He has tried nothing for the symptoms. The treatment provided no relief.   Pt reports cold sensation inside of left arm.  Pt worsening pain in his neck.  Pt reports he has had a neck fusion by Dr. Jeral Fruit.  Pt reports he has chronic weakness to left arm and shoulder after a tear to his rotator cuff.  Pt reports limited use of his left arm.  Pt reports pain is worse and cold shooting pain in arm is new Past Medical History:  Diagnosis Date  . Anxiety   . Bladder stone   . Depression   . Gout   . Hypertension   . Sleep apnea     There are no active problems to display for this patient.   Past Surgical History:  Procedure Laterality Date  . CERVICAL FUSION    . HERNIA REPAIR Right    inguinal  . SHOULDER ARTHROSCOPY WITH ROTATOR CUFF REPAIR AND SUBACROMIAL DECOMPRESSION Left 07/29/2012   Procedure: LEFT SHOULDER ARTHROSCOPY WITH SUBACROMIAL DECOMPRESSION, DISTAL CLAVICLE RESECTION/ROTATOR CUFF REPAIR ;  Surgeon: Senaida Lange, MD;  Location: [facility] OR;  Service: Orthopedics;  Laterality: Left;  . SHOULDER SURGERY    . UMBILICAL HERNIA REPAIR         Home Medications    Prior to Admission medications   Medication Sig Start Date End Date Taking? Authorizing Provider  allopurinol (ZYLOPRIM) 300 MG tablet Take 300 mg by mouth daily.     [provider]  ALPRAZolam Prudy Feeler) 0.5 MG tablet Take 0.5 mg by mouth daily as needed for anxiety or sleep.     [provider]  cephALEXin (KEFLEX) 500 MG capsule Take 1 capsule (500 mg total) by mouth 3 (three) times daily. For 7 days 08/14/15   Triplett, Tammy, PA-C  dexamethasone (DECADRON) 4 MG tablet Take 1 tablet (4 mg total) by mouth 2 (two) times daily with a meal. 08/19/15   Ivery Quale, PA-C  doxycycline (VIBRAMYCIN) 100 MG capsule Take 1 capsule (100 mg total) by mouth 2 (two) times daily. 08/19/15   Ivery Quale, PA-C  ibuprofen (ADVIL,MOTRIN) 200 MG tablet Take 800 mg by mouth every 6 (six) hours as needed for mild pain or moderate pain.    [provider]  LISINOPRIL PO Take 1 tablet by mouth daily.    [provider]  sertraline (ZOLOFT) 100 MG tablet Take 100 mg by mouth daily.    [provider]  UNKNOWN TO PATIENT Take 1 tablet by mouth daily. Blood pressure    [provider]  UNKNOWN TO PATIENT Take 1 tablet by mouth daily. Cholesterol    [provider]    Family History No family history on file.  Social History Social History  Substance Use Topics  . Smoking status: Never  Smoker  . Smokeless tobacco: Never Used  . Alcohol use 3.6 oz/week    6 Cans of beer per week     Comment: once a week     Allergies   Kiwi extract and Strawberry extract   Review of Systems Review of Systems  Musculoskeletal: Positive for neck pain.  Neurological: Positive for numbness.  All other systems reviewed and are negative.    Physical Exam Updated Vital Signs BP (!) 203/99   Pulse 78   Temp 98 F (36.7 C) (Oral)   Resp 18   Ht 5\' 7"  (1.702 m)   Wt 115.7 kg (255 lb)   SpO2 96%   BMI 39.94 kg/m   Physical Exam  Constitutional: He appears well-developed and well-nourished.  HENT:  Head: Normocephalic and atraumatic.  Eyes: Conjunctivae are normal.  Neck:  Tender diffusely,  Pain with range of motion.  Left  arm,  Pt holds arm, limited range of motion,    Cardiovascular: Normal rate and regular rhythm.   No murmur heard. Pulmonary/Chest: Effort normal and breath sounds normal. No respiratory distress.  Abdominal: Soft. There is no tenderness.  Musculoskeletal: He exhibits no edema.  Neurological: He is alert.  Skin: Skin is warm and dry.  Psychiatric: He has a normal mood and affect.  Nursing note and vitals reviewed.    ED Treatments / Results  Labs (all labs ordered are listed, but only abnormal results are displayed) Labs Reviewed - No data to display  EKG  EKG Interpretation None       Radiology Dg Cervical Spine Complete  Result Date: 09/13/2016 CLINICAL DATA:  56 year old with prior C5 through C7 fusion presenting with chronic neck and upper back pain which has acutely worsened over the past 2 days, associated with radicular pain and tingling involving the left upper extremity. No recent injuries. EXAM: CERVICAL SPINE - COMPLETE 4+ VIEW COMPARISON:  CT cervical spine 08/15/2011. FINDINGS: Straightening of the usual cervical lordosis. Anatomic alignment. No fractures. Normal prevertebral soft tissues. Mild disc space narrowing at C4-5. Moderate disc space narrowing at C7-T1. Solid appearing fusion C5 through C7 with intact hardware. No significant bony foraminal stenoses on the oblique views. No static evidence of instability. IMPRESSION: 1. Straightening of the usual lordosis which may reflect positioning and/or spasm. No acute osseous abnormality. 2. Mild degenerative disc disease at C4-5 and moderate degenerative disc disease at C7-T1. 3. Solid appearing fusion from C5 through C7 without complicating features. Electronically Signed   By: Hulan Saas M.D.   On: 09/13/2016 14:15   Mr Cervical Spine Wo Contrast  Result Date: 09/13/2016 CLINICAL DATA:  Neck pain. Left arm numbness and weakness. Prior cervical fusion EXAM: MRI CERVICAL SPINE WITHOUT CONTRAST TECHNIQUE:  Multiplanar, multisequence MR imaging of the cervical spine was performed. No intravenous contrast was administered. COMPARISON:  CT cervical spine 08/15/2011 FINDINGS: Alignment: Mild anterolisthesis C5-6 unchanged. Vertebrae: ACDF with solid fusion C5-6 and C6-7. Negative for fracture or mass. Cord: Cord compression at C4-5 with possible slight cord hyperintensity best seen on sagittal T2 images. Posterior Fossa, vertebral arteries, paraspinal tissues: Negative Disc levels: C2-3:  Negative C3-4: Mild uncinate spurring diffusely. Mild facet degeneration. Small central disc protrusion. Mild spinal stenosis and mild foraminal stenosis bilaterally. C4-5: Large central disc protrusion and osteophyte with cord compression and probable mild cord hyperintensity. Severe spinal stenosis. Moderate foraminal narrowing bilaterally. Bilateral facet degeneration. C5-6:  Solid fusion without stenosis C6-7: Solid fusion without stenosis C7-T1: Left-sided disc protrusion causing left foraminal  encroachment and possible impingement of the left C8 nerve root. Mild cord flattening on the left. IMPRESSION: Disc and facet degeneration C3-4 causing mild spinal stenosis and mild foraminal stenosis bilaterally Central disc protrusion and osteophyte at C4-5 with severe spinal stenosis and cord compression. Probable mild cord hyperintensity at this level Solid fusion C5-6 and C6-7 Left-sided disc protrusion C7-T1 with possible impingement of the left C8 nerve root. Electronically Signed   By: Marlan Palauharles  Clark M.D.   On: 09/13/2016 16:00    Procedures Procedures (including critical care time)  Medications Ordered in ED Medications - No data to display   Initial Impression / Assessment and Plan / ED Course  I have reviewed the triage vital signs and the nursing notes.  Pertinent labs & imaging results that were available during my care of the patient were reviewed by me and considered in my medical decision making (see chart for  details).     I discussed pt with Dr. Yetta BarreJones.   He advised prednisone and pain medication.   Pt needs to call the office to schedule follow up with Dr. Yetta BarreJones.    Final Clinical Impressions(s) / ED Diagnoses   Final diagnoses:  HNP (herniated nucleus pulposus)   Plan discussed with pt. Pt understands follow up New Prescriptions New Prescriptions   OXYCODONE-ACETAMINOPHEN (PERCOCET/ROXICET) 5-325 MG TABLET    Take 2 tablets by mouth every 4 (four) hours as needed for severe pain.   PREDNISONE (DELTASONE) 10 MG TABLET    6,6,5,5,4,4,3,3,2,2,1,1 taper     Elson AreasSofia, Leslie K, PA-C 09/13/16 1708    Osie CheeksSofia, Leslie K, PA-C 09/13/16 1713    Jacalyn LefevreHaviland, Julie, MD 09/14/16 [phone]

## 2016-09-13 NOTE — ED Triage Notes (Signed)
Pt c/o worsening pain in upper back, right below neck, x 2 days. Pt reports intermittent "ice cold pain and tingling" in left arm. Pt has chronic neck pain with surgery. Denies injury.

## 2016-09-25 ENCOUNTER — Other Ambulatory Visit: Payer: Self-pay | Admitting: Neurological Surgery

## 2016-09-25 DIAGNOSIS — M5412 Radiculopathy, cervical region: Secondary | ICD-10-CM

## 2016-10-05 ENCOUNTER — Ambulatory Visit
Admission: RE | Admit: 2016-10-05 | Discharge: 2016-10-05 | Disposition: A | Discharge status: Home / Self Care | Payer: No Typology Code available for payment source | Source: Ambulatory Visit | Attending: Neurological Surgery | Admitting: Neurological Surgery

## 2016-10-05 DIAGNOSIS — M5412 Radiculopathy, cervical region: Secondary | ICD-10-CM

## 2016-10-05 MED ORDER — IOPAMIDOL (ISOVUE-M 300) INJECTION 61%
1.0000 mL | Freq: Once | INTRAMUSCULAR | Status: AC | PRN
  Administered 2016-10-05: 1 mL via EPIDURAL

## 2016-10-05 MED ORDER — TRIAMCINOLONE ACETONIDE 40 MG/ML IJ SUSP (RADIOLOGY)
60.0000 mg | Freq: Once | INTRAMUSCULAR | Status: AC
  Administered 2016-10-05: 60 mg via EPIDURAL

## 2016-10-05 NOTE — Discharge Instructions (Signed)

## 2017-01-17 DIAGNOSIS — Z139 Encounter for screening, unspecified: Secondary | ICD-10-CM

## 2017-01-17 LAB — GLUCOSE, POCT (MANUAL RESULT ENTRY): POC Glucose: 129 mg/dl — AB (ref 70–99)

## 2017-01-19 NOTE — Congregational Nurse Program (Signed)
Congregational Nurse Program Note  Date of Encounter: 01/17/2017  Past Medical History: Past Medical History:  Diagnosis Date  . Anxiety   . Bladder stone   . Depression   . Gout   . Hypertension   . Sleep apnea     Encounter Details: CNP Questionnaire - 01/17/17 1350      Questionnaire   Patient Status  Not Applicable    Race  White or Caucasian    Location Patient Served At  [facility]    Insurance  Not Applicable    Uninsured  Uninsured (NEW 1x/quarter)    Food  No food insecurities    Housing/Utilities  Yes, have permanent housing    Transportation  No transportation needs    Interpersonal Safety  Yes, feel physically and emotionally safe where you currently live    Medication  No medication insecurities    Medical Provider  No    Referrals  [facility];Primary Care Provider/Clinic;Behavioral/Mental Health Provider    ED Visit Averted  Not Applicable    Life-Saving Intervention Made  Not Applicable      [facility] to [facility] today. Client seeking assistance in navigating into a primary care provider. Client previously with Dr. Reuel Boomaniel in [place] and was last seen in Jan, 2018. Client states is unable to afford to pay out of pocket co-pays there. He nor his wife are employed and he does not have insurance. They do receive food stamps and he has relatives that assist with bills such as utilities. Client states he has been attempting to ration out his medications. He states he has not taken his lisinopril for 3 to 4 days. He is currently out of all medications except lisinopril which he has 17 of and an unknown amount of allopurinol.    Past medical history: Gout Hypertension Anxiety Depression Carpal Tunnel bilateral Obstructive sleep Apnea  Past surgical History Left rotator cuff surgery Cervical disc surgery Hernia repair   Alert and oriented to person, place and time and answers questions appropriately. Client lives with his wife. Client's  blood pressure is elevated today. Client states he has not taken his lisinopril in 3 to 4 days due to trying to make them last. RN counseled client on taking his medication as soon as he gets home and to not ration them now. Client has 17 left and we will connect him to a primary care provider as soon as possible. Client agreeable. Discussed risks of uncontrolled blood pressure such as heart attack, and stroke. Client verbalizes understanding.  Client reports history of sleep apnea and has used CPAP in the past but states he has not used it lately due to further neck pain and injury he has been unable to sleep in the bed and has been sleeping in a recliner/chair. RN suggested to attempt to use his CPAP while sleeping in the recliner, to see if it works. RN discussed how sleep apnea untreated can affect his blood pressure, his overall health. Client states understanding.  Client denies thoughts of suicide. He does report feelings of worthlessness due to no income and employment. Client had been prescribed Zoloft by his former primary care but has been out of it. He states his anxiety has not "been issue before" He had been on xanax by the same provider. He states he has not taken any in a while. Discussed possibility of mental health counseling. Hours for [facility] walk in intake times given to client. Client wishes to connect  with his new primary care provider first and then decide.  Denies any chest pains or shortness of breath. He does state he feels a little head and nasal congestion. Denies fevers or chills or body aches. Client does state that his has been having difficulty with his urine flow and pressure. He also shares that he is up several times at night to urinate. He cannot remember the last time he had a prostrate check. His non-fasting capillary glucose is 129 today.  Client does report neck pain. He has cervical disc surgery in the past, but has had more recent increased pain and was found to has  some new cervical disc issues. He states that he has had an injection into his cervical discs that did help, but he is having more pain. [facility] October [facility], [facility].  Discussed with client his options for primary care. He states [facility] recently assisted his wife to get into the free clinic and he would also like to go there. Referral made and appointment secured for 01/22/17 at 10:15 am. Contact information given.  RN again counseled client on taking his lisinopril when getting home and client assures he will do so. RN again discussed the walk in intake times for [facility] and dates and times given to client in writing.   Will follow up with client after his free clinic appointment.

## 2017-01-22 ENCOUNTER — Ambulatory Visit: Payer: Self-pay | Admitting: Physician Assistant

## 2017-01-22 ENCOUNTER — Encounter: Payer: Self-pay | Admitting: Physician Assistant

## 2017-01-22 ENCOUNTER — Other Ambulatory Visit: Payer: Self-pay | Admitting: Physician Assistant

## 2017-01-22 VITALS — BP 140/88 | HR 89 | Temp 98.2°F | Ht 66.25 in | Wt 258.0 lb

## 2017-01-22 DIAGNOSIS — Z1211 Encounter for screening for malignant neoplasm of colon: Secondary | ICD-10-CM

## 2017-01-22 DIAGNOSIS — Z125 Encounter for screening for malignant neoplasm of prostate: Secondary | ICD-10-CM

## 2017-01-22 DIAGNOSIS — M4800 Spinal stenosis, site unspecified: Secondary | ICD-10-CM | POA: Insufficient documentation

## 2017-01-22 DIAGNOSIS — Z981 Arthrodesis status: Secondary | ICD-10-CM

## 2017-01-22 DIAGNOSIS — Z131 Encounter for screening for diabetes mellitus: Secondary | ICD-10-CM

## 2017-01-22 DIAGNOSIS — Z13220 Encounter for screening for lipoid disorders: Secondary | ICD-10-CM

## 2017-01-22 DIAGNOSIS — I10 Essential (primary) hypertension: Secondary | ICD-10-CM | POA: Insufficient documentation

## 2017-01-22 DIAGNOSIS — M5020 Other cervical disc displacement, unspecified cervical region: Secondary | ICD-10-CM

## 2017-01-22 MED ORDER — METOPROLOL TARTRATE 25 MG PO TABS: 25.0000 mg | ORAL_TABLET | Freq: Two times a day (BID) | ORAL | 1 refills | Status: DC

## 2017-01-22 MED ORDER — LISINOPRIL 20 MG PO TABS: 20.0000 mg | ORAL_TABLET | Freq: Every day | ORAL | 1 refills | Status: DC

## 2017-01-22 NOTE — Progress Notes (Signed)
BP 140/88 (BP Location: Left Arm, Patient Position: Sitting, Cuff Size: Normal)   Pulse 89   Temp 98.2 F (36.8 C)   Ht 5' 6.25" (1.683 m)   Wt 258 lb (117 kg)   SpO2 96%   BMI 41.33 kg/m    Subjective:    Patient ID: Tommy RightJohn W Louderback Jr., male    DOB: 28-Nov-1960, 56 y.o.   MRN: [id number]  HPI: Tommy RightJohn W Yarde Jr. is a 56 y.o. male presenting on 01/22/2017 for New Patient (Initial Visit)   HPI Pt previously went to Dr Reuel Boomaniel in [place].  He has Not been seen there since January.   Pt recently seen by dr Yetta BarreJones (neurosurgeon) and got injection in his neck.  He says that helped a lot ut that dr Yetta BarreJones says he needed surgery.  He said pt needed to be seen by ENT first- pt doesn't know why.  He says that he never talked money with Dr Yetta BarreJones but he knows he cannot afford the surgery.   Pt had MRI in July 2018.  Pt states states occ hematuria.  Says he has kidney stones.  He says it doesn't bother him.   Pt says he never had colonoscopy.   Relevant past medical, surgical, family and social history reviewed and updated as indicated. Interim medical history since our last visit reviewed. Allergies and medications reviewed and updated.   Current Outpatient Medications:  .  allopurinol (ZYLOPRIM) 300 MG tablet, Take 300 mg by mouth daily. , Disp: , Rfl:  .  LISINOPRIL PO, Take 20 mg by mouth daily. , Disp: , Rfl:    Review of Systems  Constitutional: Negative for appetite change, chills, diaphoresis, fatigue, fever and unexpected weight change.  HENT: Negative for congestion, dental problem, drooling, ear pain, facial swelling, hearing loss, mouth sores, sneezing, sore throat, trouble swallowing and voice change.   Eyes: Negative for pain, discharge, redness, itching and visual disturbance.  Respiratory: Negative for cough, choking, shortness of breath and wheezing.   Cardiovascular: Negative for chest pain, palpitations and leg swelling.  Gastrointestinal: Negative for abdominal pain,  blood in stool, constipation, diarrhea and vomiting.  Endocrine: Negative for cold intolerance, heat intolerance and polydipsia.  Genitourinary: Positive for hematuria. Negative for decreased urine volume and dysuria.  Musculoskeletal: Positive for arthralgias and back pain. Negative for gait problem.  Skin: Negative for rash.  Allergic/Immunologic: Negative for environmental allergies.  Neurological: Negative for seizures, syncope, light-headedness and headaches.  Hematological: Negative for adenopathy.  Psychiatric/Behavioral: Negative for agitation, dysphoric mood and suicidal ideas. The patient is nervous/anxious.     Per HPI unless specifically indicated above     Objective:    BP 140/88 (BP Location: Left Arm, Patient Position: Sitting, Cuff Size: Normal)   Pulse 89   Temp 98.2 F (36.8 C)   Ht 5' 6.25" (1.683 m)   Wt 258 lb (117 kg)   SpO2 96%   BMI 41.33 kg/m   Wt Readings from Last 3 Encounters:  01/22/17 258 lb (117 kg)  01/17/17 264 lb 12.8 oz (120.1 kg)  09/13/16 255 lb (115.7 kg)    Physical Exam  Constitutional: He is oriented to person, place, and time. He appears well-developed and well-nourished.  HENT:  Head: Normocephalic and atraumatic.  Mouth/Throat: Oropharynx is clear and moist. No oropharyngeal exudate.  Eyes: Conjunctivae and EOM are normal. Pupils are equal, round, and reactive to light.  Neck: Neck supple. No thyromegaly present.  Cardiovascular: Normal rate and regular rhythm.  Pulmonary/Chest: Effort normal and breath sounds normal. He has no wheezes. He has no rales.  Abdominal: Soft. Bowel sounds are normal. He exhibits no mass. There is no hepatosplenomegaly. There is no tenderness.  Musculoskeletal: He exhibits no edema.  Lymphadenopathy:    He has no cervical adenopathy.  Neurological: He is alert and oriented to person, place, and time.  Skin: Skin is warm and dry. No rash noted.  Psychiatric: He has a normal mood and affect. His  behavior is normal. Thought content normal.  Vitals reviewed.       Assessment & Plan:     Encounter Diagnoses  Name Primary?  . Essential hypertension Yes  . Screening for colon cancer   . Spinal stenosis, unspecified spinal region   . Osteoarthritis of cervical spine, unspecified spinal osteoarthritis complication status   . History of fusion of cervical spine   . Morbid obesity (HCC)   . Screening cholesterol level   . Screening for prostate cancer   . Screening for diabetes mellitus      -pt was given iFOBT for colon cancer screening -pt to get baseline labs  -pt to Continue lisinopril.  Add metoprolol 25 for the blood pressure -Record request sent to Dr Yetta BarreJones.- [facility] -pt is signed up for [facility]-  -pt to follow up in 1 month.  RTO sooner prn

## 2017-01-22 NOTE — Patient Instructions (Signed)
Medications Labs Colon cancer screening test

## 2017-01-30 ENCOUNTER — Telehealth: Payer: Self-pay

## 2017-01-30 NOTE — Telephone Encounter (Signed)
Pt [facility] at [facility] on 01/17/17 for assistance in navigation into a PCP. [facility] and appointment was scheduled for 01/22/17 at 1015 am.  Call was made to pt for follow-up visit. Pt states he is doing good. Has gotten his BP meds according to pt and chart. Pt states [facility]. Pt states he has not been to see a Mental Health Provider for is MH medicaitons.Encounraged pt to seek MH.  Pt states he plans on going [facility] to [facility].    Leeam Cedrone R. Breea Loncar LPN [phone]

## 2017-01-31 ENCOUNTER — Other Ambulatory Visit (HOSPITAL_COMMUNITY)
Admission: RE | Admit: 2017-01-31 | Discharge: 2017-01-31 | Disposition: A | Discharge status: Home / Self Care | Payer: Self-pay | Source: Ambulatory Visit | Attending: Physician Assistant | Admitting: Physician Assistant

## 2017-01-31 DIAGNOSIS — Z125 Encounter for screening for malignant neoplasm of prostate: Secondary | ICD-10-CM | POA: Insufficient documentation

## 2017-01-31 DIAGNOSIS — I10 Essential (primary) hypertension: Secondary | ICD-10-CM | POA: Insufficient documentation

## 2017-01-31 DIAGNOSIS — Z13220 Encounter for screening for lipoid disorders: Secondary | ICD-10-CM | POA: Insufficient documentation

## 2017-01-31 DIAGNOSIS — Z131 Encounter for screening for diabetes mellitus: Secondary | ICD-10-CM | POA: Insufficient documentation

## 2017-01-31 LAB — COMPREHENSIVE METABOLIC PANEL
ALT: 90 U/L — ABNORMAL HIGH (ref 17–63)
AST: 56 U/L — AB (ref 15–41)
Albumin: 4.2 g/dL (ref 3.5–5.0)
Alkaline Phosphatase: 90 U/L (ref 38–126)
Anion gap: 8 (ref 5–15)
BILIRUBIN TOTAL: 0.6 mg/dL (ref 0.3–1.2)
BUN: 14 mg/dL (ref 6–20)
CHLORIDE: 105 mmol/L (ref 101–111)
CO2: 24 mmol/L (ref 22–32)
Calcium: 9 mg/dL (ref 8.9–10.3)
Creatinine, Ser: 0.95 mg/dL (ref 0.61–1.24)
Glucose, Bld: 132 mg/dL — ABNORMAL HIGH (ref 65–99)
POTASSIUM: 3.6 mmol/L (ref 3.5–5.1)
Sodium: 137 mmol/L (ref 135–145)
TOTAL PROTEIN: 7.2 g/dL (ref 6.5–8.1)

## 2017-01-31 LAB — LIPID PANEL
CHOL/HDL RATIO: 6.7 ratio
CHOLESTEROL: 229 mg/dL — AB (ref 0–200)
HDL: 34 mg/dL — AB (ref >40)
LDL CALC: 153 mg/dL — AB (ref 0–99)
TRIGLYCERIDES: 209 mg/dL — AB (ref <150)
VLDL: 42 mg/dL — AB (ref 0–40)

## 2017-01-31 LAB — IFOBT (OCCULT BLOOD): IFOBT: NEGATIVE

## 2017-01-31 LAB — HEMOGLOBIN A1C
Hgb A1c MFr Bld: 5.9 % — ABNORMAL HIGH (ref 4.8–5.6)
MEAN PLASMA GLUCOSE: 122.63 mg/dL

## 2017-01-31 LAB — PSA: Prostatic Specific Antigen: 0.63 ng/mL (ref 0.00–4.00)

## 2017-02-05 ENCOUNTER — Other Ambulatory Visit: Payer: Self-pay | Admitting: Physician Assistant

## 2017-02-05 MED ORDER — ALLOPURINOL 300 MG PO TABS: 300.0000 mg | ORAL_TABLET | Freq: Every day | ORAL | 1 refills | Status: DC

## 2017-02-05 MED ORDER — LISINOPRIL 20 MG PO TABS: 20.0000 mg | ORAL_TABLET | Freq: Every day | ORAL | 1 refills | Status: DC

## 2017-02-19 ENCOUNTER — Encounter: Payer: Self-pay | Admitting: Physician Assistant

## 2017-02-19 ENCOUNTER — Ambulatory Visit: Payer: Self-pay | Admitting: Physician Assistant

## 2017-02-19 VITALS — BP 165/92 | HR 71 | Temp 97.9°F | Ht 66.25 in | Wt 264.5 lb

## 2017-02-19 DIAGNOSIS — R7303 Prediabetes: Secondary | ICD-10-CM

## 2017-02-19 DIAGNOSIS — I10 Essential (primary) hypertension: Secondary | ICD-10-CM

## 2017-02-19 DIAGNOSIS — R945 Abnormal results of liver function studies: Secondary | ICD-10-CM

## 2017-02-19 DIAGNOSIS — E785 Hyperlipidemia, unspecified: Secondary | ICD-10-CM

## 2017-02-19 DIAGNOSIS — R7989 Other specified abnormal findings of blood chemistry: Secondary | ICD-10-CM

## 2017-02-19 DIAGNOSIS — M5020 Other cervical disc displacement, unspecified cervical region: Secondary | ICD-10-CM

## 2017-02-19 DIAGNOSIS — F39 Unspecified mood [affective] disorder: Secondary | ICD-10-CM

## 2017-02-19 DIAGNOSIS — M4800 Spinal stenosis, site unspecified: Secondary | ICD-10-CM

## 2017-02-19 MED ORDER — ATORVASTATIN CALCIUM 20 MG PO TABS: 20.0000 mg | ORAL_TABLET | Freq: Every day | ORAL | 1 refills | Status: DC

## 2017-02-19 MED ORDER — ALLOPURINOL 300 MG PO TABS: 300.0000 mg | ORAL_TABLET | Freq: Every day | ORAL | 1 refills | Status: DC

## 2017-02-19 NOTE — Progress Notes (Signed)
BP (!) 164/94 (BP Location: Left Arm, Patient Position: Sitting, Cuff Size: Large)   Pulse 71   Temp 97.9 F (36.6 C) (Other (Comment))   Ht 5' 6.25" (1.683 m)   Wt 264 lb 8 oz (120 kg)   SpO2 97%   BMI 42.37 kg/m    Subjective:    Patient ID: Tommy RightJohn W Reining Jr., male    DOB: April 18, 1960, 56 y.o.   MRN: [id number]  HPI: Tommy RightJohn W Bushart Jr. is a 56 y.o. male presenting on 02/19/2017 for Hypertension   HPI   Pt hasn't been back [facility] to get back on his zoloft.  He doesn't know why he hasn't been back, he just hasn't done it.  He says he know he needs to get back there because he feels angry all the time.  No SI or HI.   Pt feels like this is contributing to his bp.  [facility] but he hasn't gotten meds yet  Records received from neurosurgeon Marikay Alaravid Jones whose note recommends surgery.   Referral was ordered at last OV to [facility].  Relevant past medical, surgical, family and social history reviewed and updated as indicated. Interim medical history since our last visit reviewed. Allergies and medications reviewed and updated.   Current Outpatient Medications:  .  lisinopril (PRINIVIL,ZESTRIL) 20 MG tablet, Take 1 tablet (20 mg total) by mouth daily., Disp: 90 tablet, Rfl: 1 .  metoprolol tartrate (LOPRESSOR) 25 MG tablet, Take 1 tablet (25 mg total) by mouth 2 (two) times daily., Disp: 60 tablet, Rfl: 1 .  allopurinol (ZYLOPRIM) 300 MG tablet, Take 1 tablet (300 mg total) by mouth daily. (Patient not taking: Reported on 02/19/2017), Disp: 90 tablet, Rfl: 1   Review of Systems  Constitutional: Positive for fatigue. Negative for appetite change, chills, diaphoresis, fever and unexpected weight change.  HENT: Negative for congestion, dental problem, drooling, ear pain, facial swelling, hearing loss, mouth sores, sneezing, sore throat, trouble swallowing and voice change.   Eyes: Negative for pain, discharge, redness, itching  and visual disturbance.  Respiratory: Negative for cough, choking, shortness of breath and wheezing.   Cardiovascular: Negative for chest pain, palpitations and leg swelling.  Gastrointestinal: Negative for abdominal pain, blood in stool, constipation, diarrhea and vomiting.  Endocrine: Positive for polydipsia. Negative for cold intolerance and heat intolerance.  Genitourinary: Positive for decreased urine volume. Negative for dysuria and hematuria.  Musculoskeletal: Positive for arthralgias and back pain. Negative for gait problem.  Skin: Negative for rash.  Allergic/Immunologic: Negative for environmental allergies.  Neurological: Negative for seizures, syncope, light-headedness and headaches.  Hematological: Negative for adenopathy.  Psychiatric/Behavioral: Negative for agitation, dysphoric mood and suicidal ideas. The patient is not nervous/anxious.     Per HPI unless specifically indicated above     Objective:    BP (!) 164/94 (BP Location: Left Arm, Patient Position: Sitting, Cuff Size: Large)   Pulse 71   Temp 97.9 F (36.6 C) (Other (Comment))   Ht 5' 6.25" (1.683 m)   Wt 264 lb 8 oz (120 kg)   SpO2 97%   BMI 42.37 kg/m   Wt Readings from Last 3 Encounters:  02/19/17 264 lb 8 oz (120 kg)  01/22/17 258 lb (117 kg)  01/17/17 264 lb 12.8 oz (120.1 kg)    Physical Exam  Constitutional: He is oriented to person, place, and time. He appears well-developed and well-nourished.  HENT:  Head: Normocephalic and atraumatic.  Neck:  Neck supple.  Cardiovascular: Normal rate and regular rhythm.  Pulmonary/Chest: Effort normal and breath sounds normal. He has no wheezes.  Abdominal: Soft. Bowel sounds are normal. There is no hepatosplenomegaly. There is no tenderness.  Musculoskeletal: He exhibits no edema.  Lymphadenopathy:    He has no cervical adenopathy.  Neurological: He is alert and oriented to person, place, and time.  Skin: Skin is warm and dry.  Psychiatric: He has a  normal mood and affect. His behavior is normal.  Vitals reviewed.   Results for orders placed or performed during the hospital encounter of 01/31/17  HgB A1c  Result Value Ref Range   Hgb A1c MFr Bld 5.9 (H) 4.8 - 5.6 %   Mean Plasma Glucose 122.63 mg/dL  PSA  Result Value Ref Range   Prostatic Specific Antigen 0.63 0.00 - 4.00 ng/mL  Lipid panel  Result Value Ref Range   Cholesterol 229 (H) 0 - 200 mg/dL   Triglycerides 960209 (H) <150 mg/dL   HDL 34 (L) >45>40 mg/dL   Total CHOL/HDL Ratio 6.7 RATIO   VLDL 42 (H) 0 - 40 mg/dL   LDL Cholesterol 409153 (H) 0 - 99 mg/dL  Comprehensive metabolic panel  Result Value Ref Range   Sodium 137 135 - 145 mmol/L   Potassium 3.6 3.5 - 5.1 mmol/L   Chloride 105 101 - 111 mmol/L   CO2 24 22 - 32 mmol/L   Glucose, Bld 132 (H) 65 - 99 mg/dL   BUN 14 6 - 20 mg/dL   Creatinine, Ser 8.110.95 0.61 - 1.24 mg/dL   Calcium 9.0 8.9 - 91.410.3 mg/dL   Total Protein 7.2 6.5 - 8.1 g/dL   Albumin 4.2 3.5 - 5.0 g/dL   AST 56 (H) 15 - 41 U/L   ALT 90 (H) 17 - 63 U/L   Alkaline Phosphatase 90 38 - 126 U/L   Total Bilirubin 0.6 0.3 - 1.2 mg/dL   GFR calc non Af Amer >60 >60 mL/min   GFR calc Af Amer >60 >60 mL/min   Anion gap 8 5 - 15      Assessment & Plan:   Encounter Diagnoses  Name Primary?  . Essential hypertension Yes  . Hyperlipidemia, unspecified hyperlipidemia type   . Prediabetes   . Elevated LFTs   . Morbid obesity (HCC)   . Mood disorder (HCC)   . Spinal stenosis, unspecified spinal region   . Protrusion of cervical intervertebral disc     -reviewed labs with pt -Counseled pt  to avoid etoh -gave Refill allopurinol if he needs it before it arrives in the [facility]- -[facility] rx for [facility]itor to [facility] and counseled on lowfat diet - counseled pt on prediabetes and gave handout -will Recheck bp 1 month.  RTO sooner prn

## 2017-02-19 NOTE — Patient Instructions (Signed)
Prediabetes Prediabetes is the condition of having a blood sugar (blood glucose) level that is higher than it should be, but not high enough for you to be diagnosed with type 2 diabetes. Having prediabetes puts you at risk for developing type 2 diabetes (type 2 diabetes mellitus). Prediabetes may be called impaired glucose tolerance or impaired fasting glucose. Prediabetes usually does not cause symptoms. Your health care provider can diagnose this condition with blood tests. You may be tested for prediabetes if you are overweight and if you have at least one other risk factor for prediabetes. Risk factors for prediabetes include:  Having a family member with type 2 diabetes.  Being overweight or obese.  Being older than age 57.  Being of American-Indian, African-American, Hispanic/Latino, or Asian/Pacific Islander descent.  Having an inactive (sedentary) lifestyle.  Having a history of gestational diabetes or polycystic ovarian syndrome (PCOS).  Having low levels of good cholesterol (HDL-C) or high levels of blood fats (triglycerides).  Having high blood pressure.  What is blood glucose and how is blood glucose measured?  Blood glucose refers to the amount of glucose in your bloodstream. Glucose comes from eating foods that contain sugars and starches (carbohydrates) that the body breaks down into glucose. Your blood glucose level may be measured in mg/dL (milligrams per deciliter) or mmol/L (millimoles per liter).Your blood glucose may be checked with one or more of the following blood tests:  A fasting blood glucose (FBG) test. You will not be allowed to eat (you will fast) for at least 8 hours before a blood sample is taken. ? A normal range for FBG is 70-100 mg/dl (3.9-5.6 mmol/L).  An A1c (hemoglobin A1c) blood test. This test provides information about blood glucose control over the previous 2?68month.  An oral glucose tolerance test (OGTT). This test measures your blood  glucose twice: ? After fasting. This is your baseline level. ? Two hours after you drink a beverage that contains glucose.  You may be diagnosed with prediabetes:  If your FBG is 100?125 mg/dL (5.6-6.9 mmol/L).  If your A1c level is 5.7?6.4%.  If your OGGT result is 140?199 mg/dL (7.8-11 mmol/L).  These blood tests may be repeated to confirm your diagnosis. What happens if blood glucose is too high? The pancreas produces a hormone (insulin) that helps move glucose from the bloodstream into cells. When cells in the body do not respond properly to insulin that the body makes (insulin resistance), excess glucose builds up in the blood instead of going into cells. As a result, high blood glucose (hyperglycemia) can develop, which can cause many complications. This is a symptom of prediabetes. What can happen if blood glucose stays higher than normal for a long time? Having high blood glucose for a long time is dangerous. Too much glucose in your blood can damage your nerves and blood vessels. Long-term damage can lead to complications from diabetes, which may include:  Heart disease.  Stroke.  Blindness.  Kidney disease.  Depression.  Poor circulation in the feet and legs, which could lead to surgical removal (amputation) in severe cases.  How can prediabetes be prevented from turning into type 2 diabetes?  To help prevent type 2 diabetes, take the following actions:  Be physically active. ? Do moderate-intensity physical activity for at least 30 minutes on at least 5 days of the week, or as much as told by your health care provider. This could be brisk walking, biking, or water aerobics. ? Ask your health care provider what  activities are safe for you. A mix of physical activities may be best, such as walking, swimming, cycling, and strength training.  Lose weight as told by your health care provider. ? Losing 5-7% of your body weight can reverse insulin resistance. ? Your health  care provider can determine how much weight loss is best for you and can help you lose weight safely.  Follow a healthy meal plan. This includes eating lean proteins, complex carbohydrates, fresh fruits and vegetables, low-fat dairy products, and healthy fats. ? Follow instructions from your health care provider about eating or drinking restrictions. ? Make an appointment to see a diet and nutrition specialist (registered dietitian) to help you create a healthy eating plan that is right for you.  Do not smoke or use any tobacco products, such as cigarettes, chewing tobacco, and e-cigarettes. If you need help quitting, ask your health care provider.  Take over-the-counter and prescription medicines as told by your health care provider. You may be prescribed medicines that help lower the risk of type 2 diabetes.  This information is not intended to replace advice given to you by your health care provider. Make sure you discuss any questions you have with your health care provider. Document Released: 06/13/2015 Document Revised: 07/28/2015 Document Reviewed: 04/12/2015 Elsevier Interactive Patient Education  2018 [employer]   Fat and Cholesterol Restricted Diet High levels of fat and cholesterol in your blood may lead to various health problems, such as diseases of the heart, blood vessels, gallbladder, liver, and pancreas. Fats are concentrated sources of energy that come in various forms. Certain types of fat, including saturated fat, may be harmful in excess. Cholesterol is a substance needed by your body in small amounts. Your body makes all the cholesterol it needs. Excess cholesterol comes from the food you eat. When you have high levels of cholesterol and saturated fat in your blood, health problems can develop because the excess fat and cholesterol will gather along the walls of your blood vessels, causing them to narrow. Choosing the right foods will help you control your intake of fat and  cholesterol. This will help keep the levels of these substances in your blood within normal limits and reduce your risk of disease. What is my plan? Your health care provider recommends that you:  Limit your fat intake to ______% or less of your total calories per day.  Limit the amount of cholesterol in your diet to less than _________mg per day.  Eat 20-30 grams of fiber each day.  What types of fat should I choose?  Choose healthy fats more often. Choose monounsaturated and polyunsaturated fats, such as olive and canola oil, flaxseeds, walnuts, almonds, and seeds.  Eat more omega-3 fats. Good choices include salmon, mackerel, sardines, tuna, flaxseed oil, and ground flaxseeds. Aim to eat fish at least two times a week.  Limit saturated fats. Saturated fats are primarily found in animal products, such as meats, butter, and cream. Plant sources of saturated fats include palm oil, palm kernel oil, and coconut oil.  Avoid foods with partially hydrogenated oils in them. These contain trans fats. Examples of foods that contain trans fats are stick margarine, some tub margarines, cookies, crackers, and other baked goods. What general guidelines do I need to follow? These guidelines for healthy eating will help you control your intake of fat and cholesterol:  Check food labels carefully to identify foods with trans fats or high amounts of saturated fat.  Fill one half of your plate   with vegetables and green salads.  Fill one fourth of your plate with whole grains. Look for the word "whole" as the first word in the ingredient list.  Fill one fourth of your plate with lean protein foods.  Limit fruit to two servings a day. Choose fruit instead of juice.  Eat more foods that contain fiber, such as apples, broccoli, carrots, beans, peas, and barley.  Eat more home-cooked food and less restaurant, buffet, and fast food.  Limit or avoid alcohol.  Limit foods high in starch and  sugar.  Limit fried foods.  Cook foods using methods other than frying. Baking, boiling, grilling, and broiling are all great options.  Lose weight if you are overweight. Losing just 5-10% of your initial body weight can help your overall health and prevent diseases such as diabetes and heart disease.  What foods can I eat? Grains  Whole grains, such as whole wheat or whole grain breads, crackers, cereals, and pasta. Unsweetened oatmeal, bulgur, barley, quinoa, or brown rice. Corn or whole wheat flour tortillas. Vegetables  Fresh or frozen vegetables (raw, steamed, roasted, or grilled). Green salads. Fruits  All fresh, canned (in natural juice), or frozen fruits. Meats and other protein foods  Ground beef (85% or leaner), grass-fed beef, or beef trimmed of fat. Skinless chicken or turkey. Ground chicken or turkey. Pork trimmed of fat. All fish and seafood. Eggs. Dried beans, peas, or lentils. Unsalted nuts or seeds. Unsalted canned or dry beans. Dairy  Low-fat dairy products, such as skim or 1% milk, 2% or reduced-fat cheeses, low-fat ricotta or cottage cheese, or plain low-fat yo Fats and oils  Tub margarines without trans fats. Light or reduced-fat mayonnaise and salad dressings. Avocado. Olive, canola, sesame, or safflower oils. Natural peanut or almond butter (choose ones without added sugar and oil). The items listed above may not be a complete list of recommended foods or beverages. Contact your dietitian for more options. Foods to avoid Grains  White bread. White pasta. White rice. Cornbread. Bagels, pastries, and croissants. Crackers that contain trans fat. Vegetables  White potatoes. Corn. Creamed or fried vegetables. Vegetables in a cheese sauce. Fruits  Dried fruits. Canned fruit in light or heavy syrup. Fruit juice. Meats and other protein foods  Fatty cuts of meat. Ribs, chicken wings, bacon, sausage, bologna, salami, chitterlings, fatback, hot dogs, bratwurst,  and packaged luncheon meats. Liver and organ meats. Dairy  Whole or 2% milk, cream, half-and-half, and cream cheese. Whole milk cheeses. Whole-fat or sweetened yogurt. Full-fat cheeses. Nondairy creamers and whipped toppings. Processed cheese, cheese spreads, or cheese curds. Beverages  Alcohol. Sweetened drinks (such as sodas, lemonade, and fruit drinks or punches). Fats and oils  Butter, stick margarine, lard, shortening, ghee, or bacon fat. Coconut, palm kernel, or palm oils. Sweets and desserts  Corn syrup, sugars, honey, and molasses. Candy. Jam and jelly. Syrup. Sweetened cereals. Cookies, pies, cakes, donuts, muffins, and ice cream. The items listed above may not be a complete list of foods and beverages to avoid. Contact your dietitian for more information. This information is not intended to replace advice given to you by your health care provider. Make sure you discuss any questions you have with your health care provider. Document Released: 02/19/2005 Document Revised: 03/12/2014 Document Reviewed: 05/20/2013 Elsevier Interactive Patient Education  2017 Elsevier Inc.  

## 2017-03-19 ENCOUNTER — Ambulatory Visit: Payer: Self-pay | Admitting: Physician Assistant

## 2017-03-19 ENCOUNTER — Encounter: Payer: Self-pay | Admitting: Physician Assistant

## 2017-03-19 VITALS — BP 136/70 | HR 81 | Temp 97.7°F | Ht 66.25 in | Wt 261.5 lb

## 2017-03-19 DIAGNOSIS — I10 Essential (primary) hypertension: Secondary | ICD-10-CM

## 2017-03-19 DIAGNOSIS — F39 Unspecified mood [affective] disorder: Secondary | ICD-10-CM

## 2017-03-19 NOTE — Progress Notes (Signed)
BP 136/70 (BP Location: Left Arm, Patient Position: Sitting, Cuff Size: Normal)   Pulse 81   Temp 97.7 F (36.5 C)   Ht 5' 6.25" (1.683 m)   Wt 261 lb 8 oz (118.6 kg)   SpO2 95%   BMI 41.89 kg/m    Subjective:    Patient ID: [name]., male    DOB: 1960/12/05, 57 y.o.   MRN: [id number]  HPI: [name]. is a 57 y.o. male presenting on 03/19/2017 for Hypertension   HPI   He has been back [facility]  And he has follow up appt there on 03/26/17  Pt is not taking his metoprolol.  He ran out 2 days ago and didn't get them refilled.   Relevant past medical, surgical, family and social history reviewed and updated as indicated. Interim medical history since our last visit reviewed. Allergies and medications reviewed and updated.   Current Outpatient Medications:  .  allopurinol (ZYLOPRIM) 300 MG tablet, Take 1 tablet (300 mg total) by mouth daily., Disp: 30 tablet, Rfl: 1 .  atorvastatin (LIPITOR) 20 MG tablet, Take 1 tablet (20 mg total) by mouth daily., Disp: 90 tablet, Rfl: 1 .  lisinopril (PRINIVIL,ZESTRIL) 20 MG tablet, Take 1 tablet (20 mg total) by mouth daily., Disp: 90 tablet, Rfl: 1 .  metoprolol tartrate (LOPRESSOR) 25 MG tablet, Take 1 tablet (25 mg total) by mouth 2 (two) times daily. (Patient not taking: Reported on 03/19/2017), Disp: 60 tablet, Rfl: 1   Review of Systems  Constitutional: Negative for appetite change, chills, diaphoresis, fatigue, fever and unexpected weight change.  HENT: Negative for congestion, dental problem, drooling, ear pain, facial swelling, hearing loss, mouth sores, sneezing, sore throat, trouble swallowing and voice change.   Eyes: Negative for pain, discharge, redness, itching and visual disturbance.  Respiratory: Negative for cough, choking, shortness of breath and wheezing.   Cardiovascular: Negative for chest pain, palpitations and leg swelling.  Gastrointestinal: Negative for abdominal pain, blood in stool, constipation,  diarrhea and vomiting.  Endocrine: Negative for cold intolerance, heat intolerance and polydipsia.  Genitourinary: Negative for decreased urine volume, dysuria and hematuria.  Musculoskeletal: Positive for arthralgias, back pain and gait problem.  Skin: Negative for rash.  Allergic/Immunologic: Negative for environmental allergies.  Neurological: Negative for seizures, syncope, light-headedness and headaches.  Hematological: Negative for adenopathy.  Psychiatric/Behavioral: Positive for dysphoric mood. Negative for agitation and suicidal ideas. The patient is nervous/anxious.     Per HPI unless specifically indicated above     Objective:    BP 136/70 (BP Location: Left Arm, Patient Position: Sitting, Cuff Size: Normal)   Pulse 81   Temp 97.7 F (36.5 C)   Ht 5' 6.25" (1.683 m)   Wt 261 lb 8 oz (118.6 kg)   SpO2 95%   BMI 41.89 kg/m   Wt Readings from Last 3 Encounters:  03/19/17 261 lb 8 oz (118.6 kg)  02/19/17 264 lb 8 oz (120 kg)  01/22/17 258 lb (117 kg)    Physical Exam  Constitutional: He is oriented to person, place, and time. He appears well-developed and well-nourished.  HENT:  Head: Normocephalic and atraumatic.  Neck: Neck supple.  Cardiovascular: Normal rate and regular rhythm.  Pulmonary/Chest: Effort normal and breath sounds normal. He has no wheezes.  Abdominal: Soft. Bowel sounds are normal. There is no hepatosplenomegaly. There is no tenderness.  Musculoskeletal: He exhibits no edema.  Lymphadenopathy:    He has no cervical adenopathy.  Neurological: He  is alert and oriented to person, place, and time.  Skin: Skin is warm and dry.  Psychiatric: He has a normal mood and affect. His behavior is normal.  Vitals reviewed.       Assessment & Plan:    Encounter Diagnoses  Name Primary?  . Essential hypertension Yes  . Mood disorder (HCC)     -pt to get back on metoprolol.  Will follow up in 4 wk to recheck bp.  If bp good, will order metoprolol from  [facility]. -RTO sooner prn

## 2017-04-17 ENCOUNTER — Ambulatory Visit: Payer: Self-pay | Admitting: Physician Assistant

## 2017-04-17 ENCOUNTER — Encounter: Payer: Self-pay | Admitting: Physician Assistant

## 2017-04-17 VITALS — BP 135/74 | HR 51 | Temp 97.9°F | Wt 255.0 lb

## 2017-04-17 DIAGNOSIS — F39 Unspecified mood [affective] disorder: Secondary | ICD-10-CM

## 2017-04-17 DIAGNOSIS — E785 Hyperlipidemia, unspecified: Secondary | ICD-10-CM

## 2017-04-17 DIAGNOSIS — I10 Essential (primary) hypertension: Secondary | ICD-10-CM

## 2017-04-17 NOTE — Progress Notes (Signed)
BP 135/74   Pulse (!) 51   Temp 97.9 F (36.6 C)   Wt 255 lb (115.7 kg)   SpO2 98%   BMI 40.85 kg/m    Subjective:    Patient ID: Tommy Right., male    DOB: 19-Apr-1960, 57 y.o.   MRN: [id number]  HPI: Tommy Wageman. is a 57 y.o. male presenting on 04/17/2017 for Hypertension   HPI   Pt is going to [facility].  They added citalopram to his meds.   Pt says he is doing well.   Relevant past medical, surgical, family and social history reviewed and updated as indicated. Interim medical history since our last visit reviewed. Allergies and medications reviewed and updated.   Current Outpatient Medications:  .  allopurinol (ZYLOPRIM) 300 MG tablet, Take 1 tablet (300 mg total) by mouth daily., Disp: 30 tablet, Rfl: 1 .  atorvastatin (LIPITOR) 20 MG tablet, Take 1 tablet (20 mg total) by mouth daily., Disp: 90 tablet, Rfl: 1 .  citalopram (CELEXA) 20 MG tablet, Take 20 mg by mouth daily., Disp: , Rfl:  .  lisinopril (PRINIVIL,ZESTRIL) 20 MG tablet, Take 1 tablet (20 mg total) by mouth daily., Disp: 90 tablet, Rfl: 1 .  metoprolol tartrate (LOPRESSOR) 25 MG tablet, Take 1 tablet (25 mg total) by mouth 2 (two) times daily., Disp: 60 tablet, Rfl: 1   Review of Systems  Constitutional: Negative for appetite change, chills, diaphoresis, fatigue, fever and unexpected weight change.  HENT: Negative for congestion, dental problem, drooling, ear pain, facial swelling, hearing loss, mouth sores, sneezing, sore throat, trouble swallowing and voice change.   Eyes: Negative for pain, discharge, redness, itching and visual disturbance.  Respiratory: Negative for cough, choking, shortness of breath and wheezing.   Cardiovascular: Negative for chest pain, palpitations and leg swelling.  Gastrointestinal: Negative for abdominal pain, blood in stool, constipation, diarrhea and vomiting.  Endocrine: Negative for cold intolerance, heat intolerance and polydipsia.  Genitourinary: Negative  for decreased urine volume, dysuria and hematuria.  Musculoskeletal: Negative for arthralgias, back pain and gait problem.  Skin: Negative for rash.  Allergic/Immunologic: Negative for environmental allergies.  Neurological: Negative for seizures, syncope, light-headedness and headaches.  Hematological: Negative for adenopathy.  Psychiatric/Behavioral: Negative for agitation, dysphoric mood and suicidal ideas. The patient is not nervous/anxious.     Per HPI unless specifically indicated above     Objective:    BP 135/74   Pulse (!) 51   Temp 97.9 F (36.6 C)   Wt 255 lb (115.7 kg)   SpO2 98%   BMI 40.85 kg/m   Wt Readings from Last 3 Encounters:  04/17/17 255 lb (115.7 kg)  03/19/17 261 lb 8 oz (118.6 kg)  02/19/17 264 lb 8 oz (120 kg)    Physical Exam  Constitutional: He is oriented to person, place, and time. He appears well-developed and well-nourished.  HENT:  Head: Normocephalic and atraumatic.  Neck: Neck supple.  Cardiovascular: Normal rate and regular rhythm.  Pulmonary/Chest: Effort normal and breath sounds normal. He has no wheezes.  Abdominal: Soft. Bowel sounds are normal. There is no hepatosplenomegaly. There is no tenderness.  Musculoskeletal: He exhibits no edema.  Lymphadenopathy:    He has no cervical adenopathy.  Neurological: He is alert and oriented to person, place, and time.  Skin: Skin is warm and dry.  Psychiatric: He has a normal mood and affect. His behavior is normal.  Vitals reviewed.       Assessment &  Plan:    Encounter Diagnoses  Name Primary?  . Essential hypertension Yes  . Hyperlipidemia, unspecified hyperlipidemia type   . Morbid obesity (HCC)   . Mood disorder (HCC)     -Will check fasting labs and call pt with results -pt to continue current medications -Follow up 3 months.  RTO sooner prn

## 2017-04-24 ENCOUNTER — Other Ambulatory Visit (HOSPITAL_COMMUNITY)
Admission: RE | Admit: 2017-04-24 | Discharge: 2017-04-24 | Disposition: A | Discharge status: Home / Self Care | Payer: Self-pay | Source: Ambulatory Visit | Attending: Physician Assistant | Admitting: Physician Assistant

## 2017-04-24 ENCOUNTER — Other Ambulatory Visit: Payer: Self-pay | Admitting: Physician Assistant

## 2017-04-24 DIAGNOSIS — I10 Essential (primary) hypertension: Secondary | ICD-10-CM | POA: Insufficient documentation

## 2017-04-24 DIAGNOSIS — E785 Hyperlipidemia, unspecified: Secondary | ICD-10-CM | POA: Insufficient documentation

## 2017-04-24 LAB — COMPREHENSIVE METABOLIC PANEL
ALBUMIN: 4 g/dL (ref 3.5–5.0)
ALK PHOS: 103 U/L (ref 38–126)
ALT: 47 U/L (ref 17–63)
AST: 36 U/L (ref 15–41)
Anion gap: 10 (ref 5–15)
BUN: 14 mg/dL (ref 6–20)
CALCIUM: 9.1 mg/dL (ref 8.9–10.3)
CO2: 24 mmol/L (ref 22–32)
CREATININE: 1 mg/dL (ref 0.61–1.24)
Chloride: 106 mmol/L (ref 101–111)
GFR calc Af Amer: >60 mL/min (ref >60)
GFR calc non Af Amer: >60 mL/min (ref >60)
GLUCOSE: 107 mg/dL — AB (ref 65–99)
Potassium: 4 mmol/L (ref 3.5–5.1)
SODIUM: 140 mmol/L (ref 135–145)
Total Bilirubin: 0.8 mg/dL (ref 0.3–1.2)
Total Protein: 6.9 g/dL (ref 6.5–8.1)

## 2017-04-24 LAB — LIPID PANEL
Cholesterol: 109 mg/dL (ref 0–200)
HDL: 29 mg/dL — AB (ref >40)
LDL Cholesterol: 52 mg/dL (ref 0–99)
Total CHOL/HDL Ratio: 3.8 RATIO
Triglycerides: 140 mg/dL (ref <150)
VLDL: 28 mg/dL (ref 0–40)

## 2017-07-17 ENCOUNTER — Ambulatory Visit: Payer: Self-pay | Admitting: Physician Assistant

## 2017-07-17 ENCOUNTER — Encounter: Payer: Self-pay | Admitting: Physician Assistant

## 2017-07-17 VITALS — BP 132/84 | HR 57 | Temp 97.5°F | Ht 66.25 in | Wt 253.5 lb

## 2017-07-17 DIAGNOSIS — E785 Hyperlipidemia, unspecified: Secondary | ICD-10-CM | POA: Insufficient documentation

## 2017-07-17 DIAGNOSIS — F39 Unspecified mood [affective] disorder: Secondary | ICD-10-CM

## 2017-07-17 DIAGNOSIS — I10 Essential (primary) hypertension: Secondary | ICD-10-CM

## 2017-07-17 NOTE — Progress Notes (Signed)
BP 132/84 (BP Location: Left Arm, Patient Position: Sitting, Cuff Size: Large)   Pulse (!) 57   Temp (!) 97.5 F (36.4 C) (Other (Comment))   Ht 5' 6.25" (1.683 m)   Wt 253 lb 8 oz (115 kg)   SpO2 96%   BMI 40.61 kg/m    Subjective:    Patient ID: Tommy Right., male    DOB: May 08, 1960, 57 y.o.   MRN: [id number]  HPI: Tommy Muehl. is a 57 y.o. male presenting on 07/17/2017 for Hypertension and Hyperlipidemia   HPI   Pt is still going to [facility]  Pt is doing well and has no complaints.   Relevant past medical, surgical, family and social history reviewed and updated as indicated. Interim medical history since our last visit reviewed. Allergies and medications reviewed and updated.   Current Outpatient Medications:  .  allopurinol (ZYLOPRIM) 300 MG tablet, Take 1 tablet (300 mg total) by mouth daily., Disp: 30 tablet, Rfl: 1 .  atorvastatin (LIPITOR) 20 MG tablet, Take 1 tablet (20 mg total) by mouth daily., Disp: 90 tablet, Rfl: 1 .  citalopram (CELEXA) 20 MG tablet, Take 20 mg by mouth daily., Disp: , Rfl:  .  lisinopril (PRINIVIL,ZESTRIL) 20 MG tablet, Take 1 tablet (20 mg total) by mouth daily., Disp: 90 tablet, Rfl: 1 .  metoprolol tartrate (LOPRESSOR) 25 MG tablet, TAKE 1 TABLET BY MOUTH TWICE DAILY, Disp: 60 tablet, Rfl: 3   Review of Systems  Constitutional: Negative for appetite change, chills, diaphoresis, fatigue, fever and unexpected weight change.  HENT: Positive for sneezing. Negative for congestion, dental problem, drooling, ear pain, facial swelling, hearing loss, mouth sores, sore throat, trouble swallowing and voice change.   Eyes: Positive for itching. Negative for pain, discharge, redness and visual disturbance.  Respiratory: Negative for cough, choking, shortness of breath and wheezing.   Cardiovascular: Negative for chest pain, palpitations and leg swelling.  Gastrointestinal: Negative for abdominal pain, blood in stool, constipation,  diarrhea and vomiting.  Endocrine: Positive for polydipsia. Negative for cold intolerance and heat intolerance.  Genitourinary: Negative for decreased urine volume, dysuria and hematuria.  Musculoskeletal: Positive for arthralgias and back pain. Negative for gait problem.  Skin: Negative for rash.  Allergic/Immunologic: Positive for environmental allergies.  Neurological: Negative for seizures, syncope, light-headedness and headaches.  Hematological: Negative for adenopathy.  Psychiatric/Behavioral: Negative for agitation, dysphoric mood and suicidal ideas. The patient is not nervous/anxious.     Per HPI unless specifically indicated above     Objective:    BP 132/84 (BP Location: Left Arm, Patient Position: Sitting, Cuff Size: Large)   Pulse (!) 57   Temp (!) 97.5 F (36.4 C) (Other (Comment))   Ht 5' 6.25" (1.683 m)   Wt 253 lb 8 oz (115 kg)   SpO2 96%   BMI 40.61 kg/m   Wt Readings from Last 3 Encounters:  07/17/17 253 lb 8 oz (115 kg)  04/17/17 255 lb (115.7 kg)  03/19/17 261 lb 8 oz (118.6 kg)    Physical Exam  Constitutional: He is oriented to person, place, and time. He appears well-developed and well-nourished.  HENT:  Head: Normocephalic and atraumatic.  Neck: Neck supple.  Cardiovascular: Normal rate and regular rhythm.  Pulmonary/Chest: Effort normal and breath sounds normal. He has no wheezes.  Abdominal: Soft. Bowel sounds are normal. There is no hepatosplenomegaly. There is no tenderness.  Musculoskeletal: He exhibits no edema.  Lymphadenopathy:    He has no cervical  adenopathy.  Neurological: He is alert and oriented to person, place, and time.  Skin: Skin is warm and dry.  Psychiatric: He has a normal mood and affect. His behavior is normal.  Vitals reviewed.       Assessment & Plan:   Encounter Diagnoses  Name Primary?  . Essential hypertension Yes  . Hyperlipidemia, unspecified hyperlipidemia type   . Morbid obesity (HCC)   . Mood disorder  (HCC)     -will Check fasting labs and call pt with results -pt to Continue current medications -pt to follow up 3 months.  RTO sooner prn

## 2017-07-25 ENCOUNTER — Other Ambulatory Visit (HOSPITAL_COMMUNITY)
Admission: RE | Admit: 2017-07-25 | Discharge: 2017-07-25 | Disposition: A | Discharge status: Home / Self Care | Payer: Self-pay | Source: Ambulatory Visit | Attending: Physician Assistant | Admitting: Physician Assistant

## 2017-07-25 DIAGNOSIS — E785 Hyperlipidemia, unspecified: Secondary | ICD-10-CM | POA: Insufficient documentation

## 2017-07-25 DIAGNOSIS — I10 Essential (primary) hypertension: Secondary | ICD-10-CM | POA: Insufficient documentation

## 2017-07-25 LAB — COMPREHENSIVE METABOLIC PANEL
ALBUMIN: 3.9 g/dL (ref 3.5–5.0)
ALT: 60 U/L (ref 17–63)
AST: 48 U/L — AB (ref 15–41)
Alkaline Phosphatase: 91 U/L (ref 38–126)
Anion gap: 7 (ref 5–15)
BILIRUBIN TOTAL: 0.7 mg/dL (ref 0.3–1.2)
BUN: 11 mg/dL (ref 6–20)
CHLORIDE: 105 mmol/L (ref 101–111)
CO2: 27 mmol/L (ref 22–32)
Calcium: 9.2 mg/dL (ref 8.9–10.3)
Creatinine, Ser: 0.9 mg/dL (ref 0.61–1.24)
GFR calc Af Amer: >60 mL/min (ref >60)
GFR calc non Af Amer: >60 mL/min (ref >60)
GLUCOSE: 132 mg/dL — AB (ref 65–99)
POTASSIUM: 4.4 mmol/L (ref 3.5–5.1)
SODIUM: 139 mmol/L (ref 135–145)
TOTAL PROTEIN: 7.1 g/dL (ref 6.5–8.1)

## 2017-07-25 LAB — LIPID PANEL
CHOLESTEROL: 158 mg/dL (ref 0–200)
HDL: 42 mg/dL (ref >40)
LDL Cholesterol: 85 mg/dL (ref 0–99)
Total CHOL/HDL Ratio: 3.8 RATIO
Triglycerides: 154 mg/dL — ABNORMAL HIGH (ref <150)
VLDL: 31 mg/dL (ref 0–40)

## 2017-07-28 ENCOUNTER — Other Ambulatory Visit: Payer: Self-pay | Admitting: Physician Assistant

## 2017-07-28 DIAGNOSIS — E785 Hyperlipidemia, unspecified: Secondary | ICD-10-CM

## 2017-07-28 DIAGNOSIS — I10 Essential (primary) hypertension: Secondary | ICD-10-CM

## 2017-07-28 DIAGNOSIS — R7303 Prediabetes: Secondary | ICD-10-CM

## 2017-08-16 ENCOUNTER — Other Ambulatory Visit: Payer: Self-pay | Admitting: Physician Assistant

## 2017-10-04 ENCOUNTER — Other Ambulatory Visit (HOSPITAL_COMMUNITY)
Admission: RE | Admit: 2017-10-04 | Discharge: 2017-10-04 | Disposition: A | Discharge status: Home / Self Care | Payer: Self-pay | Source: Ambulatory Visit | Attending: Physician Assistant | Admitting: Physician Assistant

## 2017-10-04 DIAGNOSIS — R7303 Prediabetes: Secondary | ICD-10-CM | POA: Insufficient documentation

## 2017-10-04 DIAGNOSIS — I10 Essential (primary) hypertension: Secondary | ICD-10-CM | POA: Insufficient documentation

## 2017-10-04 DIAGNOSIS — E785 Hyperlipidemia, unspecified: Secondary | ICD-10-CM | POA: Insufficient documentation

## 2017-10-04 LAB — HEMOGLOBIN A1C
HEMOGLOBIN A1C: 5.7 % — AB (ref 4.8–5.6)
MEAN PLASMA GLUCOSE: 116.89 mg/dL

## 2017-10-04 LAB — COMPREHENSIVE METABOLIC PANEL
ALBUMIN: 3.8 g/dL (ref 3.5–5.0)
ALK PHOS: 80 U/L (ref 38–126)
ALT: 72 U/L — AB (ref 0–44)
AST: 38 U/L (ref 15–41)
Anion gap: 6 (ref 5–15)
BILIRUBIN TOTAL: 0.7 mg/dL (ref 0.3–1.2)
BUN: 21 mg/dL — ABNORMAL HIGH (ref 6–20)
CALCIUM: 8.8 mg/dL — AB (ref 8.9–10.3)
CO2: 27 mmol/L (ref 22–32)
CREATININE: 1.04 mg/dL (ref 0.61–1.24)
Chloride: 108 mmol/L (ref 98–111)
GFR calc Af Amer: >60 mL/min (ref >60)
GFR calc non Af Amer: >60 mL/min (ref >60)
GLUCOSE: 133 mg/dL — AB (ref 70–99)
Potassium: 4.1 mmol/L (ref 3.5–5.1)
Sodium: 141 mmol/L (ref 135–145)
TOTAL PROTEIN: 6.7 g/dL (ref 6.5–8.1)

## 2017-10-04 LAB — LIPID PANEL
CHOLESTEROL: 156 mg/dL (ref 0–200)
HDL: 39 mg/dL — AB (ref >40)
LDL Cholesterol: 84 mg/dL (ref 0–99)
Total CHOL/HDL Ratio: 4 RATIO
Triglycerides: 164 mg/dL — ABNORMAL HIGH (ref <150)
VLDL: 33 mg/dL (ref 0–40)

## 2017-10-14 ENCOUNTER — Encounter: Payer: Self-pay | Admitting: Physician Assistant

## 2017-10-14 ENCOUNTER — Ambulatory Visit: Payer: Self-pay | Admitting: Physician Assistant

## 2017-10-14 VITALS — BP 143/85 | HR 56 | Temp 97.5°F

## 2017-10-14 DIAGNOSIS — I10 Essential (primary) hypertension: Secondary | ICD-10-CM

## 2017-10-14 DIAGNOSIS — R945 Abnormal results of liver function studies: Secondary | ICD-10-CM

## 2017-10-14 DIAGNOSIS — R7989 Other specified abnormal findings of blood chemistry: Secondary | ICD-10-CM

## 2017-10-14 DIAGNOSIS — F39 Unspecified mood [affective] disorder: Secondary | ICD-10-CM

## 2017-10-14 DIAGNOSIS — Z125 Encounter for screening for malignant neoplasm of prostate: Secondary | ICD-10-CM

## 2017-10-14 DIAGNOSIS — E785 Hyperlipidemia, unspecified: Secondary | ICD-10-CM

## 2017-10-14 NOTE — Progress Notes (Signed)
BP (!) 143/85   Pulse (!) 56   Temp (!) 97.5 F (36.4 C)   SpO2 98%    Subjective:    Patient ID: Tommy RightJohn W Stecher Jr., male    DOB: Sep 16, 1960, 57 y.o.   MRN: [id number]  HPI: Tommy RightJohn W Senteno Jr. is a 57 y.o. male presenting on 10/14/2017 for Hypertension and Hyperlipidemia   HPI   Pt says he is doing well.  He says the citalopram is helping his mood.  He was seen at [facility] earlier this month  Relevant past medical, surgical, family and social history reviewed and updated as indicated. Interim medical history since our last visit reviewed. Allergies and medications reviewed and updated.   Current Outpatient Medications:  .  allopurinol (ZYLOPRIM) 300 MG tablet, TAKE 1 Tablet BY MOUTH ONCE DAILY, Disp: 90 tablet, Rfl: 1 .  atorvastatin (LIPITOR) 20 MG tablet, TAKE 1 Tablet BY MOUTH ONCE DAILY, Disp: 90 tablet, Rfl: 1 .  citalopram (CELEXA) 20 MG tablet, Take 20 mg by mouth daily., Disp: , Rfl:  .  lisinopril (PRINIVIL,ZESTRIL) 20 MG tablet, TAKE 1 Tablet BY MOUTH ONCE DAILY, Disp: 90 tablet, Rfl: 1 .  metoprolol tartrate (LOPRESSOR) 25 MG tablet, TAKE 1 TABLET BY MOUTH TWICE DAILY, Disp: 60 tablet, Rfl: 3 .  saw palmetto 500 MG capsule, Take 500 mg by mouth daily., Disp: , Rfl:    Review of Systems  Constitutional: Negative for appetite change, chills, diaphoresis, fatigue, fever and unexpected weight change.  HENT: Negative for congestion, dental problem, drooling, ear pain, facial swelling, hearing loss, mouth sores, sneezing, sore throat, trouble swallowing and voice change.   Eyes: Negative for pain, discharge, redness, itching and visual disturbance.  Respiratory: Negative for cough, choking, shortness of breath and wheezing.   Cardiovascular: Negative for chest pain, palpitations and leg swelling.  Gastrointestinal: Negative for abdominal pain, blood in stool, constipation, diarrhea and vomiting.  Endocrine: Negative for cold intolerance, heat intolerance and polydipsia.   Genitourinary: Negative for decreased urine volume, dysuria and hematuria.  Musculoskeletal: Positive for arthralgias and back pain. Negative for gait problem.  Skin: Negative for rash.  Allergic/Immunologic: Negative for environmental allergies.  Neurological: Negative for seizures, syncope, light-headedness and headaches.  Hematological: Negative for adenopathy.  Psychiatric/Behavioral: Negative for agitation, dysphoric mood and suicidal ideas. The patient is nervous/anxious.     Per HPI unless specifically indicated above     Objective:    BP (!) 143/85   Pulse (!) 56   Temp (!) 97.5 F (36.4 C)   SpO2 98%   Wt Readings from Last 3 Encounters:  07/17/17 253 lb 8 oz (115 kg)  04/17/17 255 lb (115.7 kg)  03/19/17 261 lb 8 oz (118.6 kg)    Physical Exam  Constitutional: He is oriented to person, place, and time. He appears well-developed and well-nourished.  HENT:  Head: Normocephalic and atraumatic.  Neck: Neck supple.  Cardiovascular: Normal rate and regular rhythm.  Pulmonary/Chest: Effort normal and breath sounds normal. He has no wheezes.  Abdominal: Soft. Bowel sounds are normal. There is no hepatosplenomegaly. There is no tenderness.  Musculoskeletal: He exhibits no edema.  Lymphadenopathy:    He has no cervical adenopathy.  Neurological: He is alert and oriented to person, place, and time.  Skin: Skin is warm and dry.  Psychiatric: He has a normal mood and affect. His behavior is normal.  Vitals reviewed.   Results for orders placed or performed during the hospital encounter of 10/04/17  Hemoglobin  A1c  Result Value Ref Range   Hgb A1c MFr Bld 5.7 (H) 4.8 - 5.6 %   Mean Plasma Glucose 116.89 mg/dL  Lipid panel  Result Value Ref Range   Cholesterol 156 0 - 200 mg/dL   Triglycerides 161164 (H) <150 mg/dL   HDL 39 (L) >09>40 mg/dL   Total CHOL/HDL Ratio 4.0 RATIO   VLDL 33 0 - 40 mg/dL   LDL Cholesterol 84 0 - 99 mg/dL  Comprehensive metabolic panel  Result  Value Ref Range   Sodium 141 135 - 145 mmol/L   Potassium 4.1 3.5 - 5.1 mmol/L   Chloride 108 98 - 111 mmol/L   CO2 27 22 - 32 mmol/L   Glucose, Bld 133 (H) 70 - 99 mg/dL   BUN 21 (H) 6 - 20 mg/dL   Creatinine, Ser 6.041.04 0.61 - 1.24 mg/dL   Calcium 8.8 (L) 8.9 - 10.3 mg/dL   Total Protein 6.7 6.5 - 8.1 g/dL   Albumin 3.8 3.5 - 5.0 g/dL   AST 38 15 - 41 U/L   ALT 72 (H) 0 - 44 U/L   Alkaline Phosphatase 80 38 - 126 U/L   Total Bilirubin 0.7 0.3 - 1.2 mg/dL   GFR calc non Af Amer >60 >60 mL/min   GFR calc Af Amer >60 >60 mL/min   Anion gap 6 5 - 15      Assessment & Plan:   Encounter Diagnoses  Name Primary?  . Essential hypertension Yes  . Hyperlipidemia, unspecified hyperlipidemia type   . Morbid obesity (HCC)   . Mood disorder (HCC)   . Elevated LFTs     -reviewed labs with pt -counseled pt on healthy diet and regular exercise.  Gave handout on DASH diet -pt to continue current medications.  Will increase medications at next OV if BP still high -counseled pt to avoid tylenol and alcohol.  Will monitor LFTs -pt to follow up in 3 months.  RTO sooner prn

## 2017-10-14 NOTE — Patient Instructions (Signed)
DASH Eating Plan DASH stands for "Dietary Approaches to Stop Hypertension." The DASH eating plan is a healthy eating plan that has been shown to reduce high blood pressure (hypertension). It may also reduce your risk for type 2 diabetes, heart disease, and stroke. The DASH eating plan may also help with weight loss. What are tips for following this plan? General guidelines  Avoid eating more than 2,300 mg (milligrams) of salt (sodium) a day. If you have hypertension, you may need to reduce your sodium intake to 1,500 mg a day.  Limit alcohol intake to no more than 1 drink a day for nonpregnant women and 2 drinks a day for men. One drink equals 12 oz of beer, 5 oz of wine, or 1 oz of hard liquor.  Work with your health care provider to maintain a healthy body weight or to lose weight. Ask what an ideal weight is for you.  Get at least 30 minutes of exercise that causes your heart to beat faster (aerobic exercise) most days of the week. Activities may include walking, swimming, or biking.  Work with your health care provider or diet and nutrition specialist (dietitian) to adjust your eating plan to your individual calorie needs. Reading food labels  Check food labels for the amount of sodium per serving. Choose foods with less than 5 percent of the Daily Value of sodium. Generally, foods with less than 300 mg of sodium per serving fit into this eating plan.  To find whole grains, look for the word "whole" as the first word in the ingredient list. Shopping  Buy products labeled as "low-sodium" or "no salt added."  Buy fresh foods. Avoid canned foods and premade or frozen meals. Cooking  Avoid adding salt when cooking. Use salt-free seasonings or herbs instead of table salt or sea salt. Check with your health care provider or pharmacist before using salt substitutes.  Do not fry foods. Cook foods using healthy methods such as baking, boiling, grilling, and broiling instead.  Cook with  heart-healthy oils, such as olive, canola, soybean, or sunflower oil. Meal planning   Eat a balanced diet that includes: ? 5 or more servings of fruits and vegetables each day. At each meal, try to fill half of your plate with fruits and vegetables. ? Up to 6-8 servings of whole grains each day. ? Less than 6 oz of lean meat, poultry, or fish each day. A 3-oz serving of meat is about the same size as a deck of cards. One egg equals 1 oz. ? 2 servings of low-fat dairy each day. ? A serving of nuts, seeds, or beans 5 times each week. ? Heart-healthy fats. Healthy fats called Omega-3 fatty acids are found in foods such as flaxseeds and coldwater fish, like sardines, salmon, and mackerel.  Limit how much you eat of the following: ? Canned or prepackaged foods. ? Food that is high in trans fat, such as fried foods. ? Food that is high in saturated fat, such as fatty meat. ? Sweets, desserts, sugary drinks, and other foods with added sugar. ? Full-fat dairy products.  Do not salt foods before eating.  Try to eat at least 2 vegetarian meals each week.  Eat more home-cooked food and less restaurant, buffet, and fast food.  When eating at a restaurant, ask that your food be prepared with less salt or no salt, if possible. What foods are recommended? The items listed may not be a complete list. Talk with your dietitian about what   dietary choices are best for you. Grains Whole-grain or whole-wheat bread. Whole-grain or whole-wheat pasta. Brown rice. Oatmeal. Quinoa. Bulgur. Whole-grain and low-sodium cereals. Pita bread. Low-fat, low-sodium crackers. Whole-wheat flour tortillas. Vegetables Fresh or frozen vegetables (raw, steamed, roasted, or grilled). Low-sodium or reduced-sodium tomato and vegetable juice. Low-sodium or reduced-sodium tomato sauce and tomato paste. Low-sodium or reduced-sodium canned vegetables. Fruits All fresh, dried, or frozen fruit. Canned fruit in natural juice (without  added sugar). Meat and other protein foods Skinless chicken or turkey. Ground chicken or turkey. Pork with fat trimmed off. Fish and seafood. Egg whites. Dried beans, peas, or lentils. Unsalted nuts, nut butters, and seeds. Unsalted canned beans. Lean cuts of beef with fat trimmed off. Low-sodium, lean deli meat. Dairy Low-fat (1%) or fat-free (skim) milk. Fat-free, low-fat, or reduced-fat cheeses. Nonfat, low-sodium ricotta or cottage cheese. Low-fat or nonfat yogurt. Low-fat, low-sodium cheese. Fats and oils Soft margarine without trans fats. Vegetable oil. Low-fat, reduced-fat, or light mayonnaise and salad dressings (reduced-sodium). Canola, safflower, olive, soybean, and sunflower oils. Avocado. Seasoning and other foods Herbs. Spices. Seasoning mixes without salt. Unsalted popcorn and pretzels. Fat-free sweets. What foods are not recommended? The items listed may not be a complete list. Talk with your dietitian about what dietary choices are best for you. Grains Baked goods made with fat, such as croissants, muffins, or some breads. Dry pasta or rice meal packs. Vegetables Creamed or fried vegetables. Vegetables in a cheese sauce. Regular canned vegetables (not low-sodium or reduced-sodium). Regular canned tomato sauce and paste (not low-sodium or reduced-sodium). Regular tomato and vegetable juice (not low-sodium or reduced-sodium). Pickles. Olives. Fruits Canned fruit in a light or heavy syrup. Fried fruit. Fruit in cream or butter sauce. Meat and other protein foods Fatty cuts of meat. Ribs. Fried meat. Bacon. Sausage. Bologna and other processed lunch meats. Salami. Fatback. Hotdogs. Bratwurst. Salted nuts and seeds. Canned beans with added salt. Canned or smoked fish. Whole eggs or egg yolks. Chicken or turkey with skin. Dairy Whole or 2% milk, cream, and half-and-half. Whole or full-fat cream cheese. Whole-fat or sweetened yogurt. Full-fat cheese. Nondairy creamers. Whipped toppings.  Processed cheese and cheese spreads. Fats and oils Butter. Stick margarine. Lard. Shortening. Ghee. Bacon fat. Tropical oils, such as coconut, palm kernel, or palm oil. Seasoning and other foods Salted popcorn and pretzels. Onion salt, garlic salt, seasoned salt, table salt, and sea salt. Worcestershire sauce. Tartar sauce. Barbecue sauce. Teriyaki sauce. Soy sauce, including reduced-sodium. Steak sauce. Canned and packaged gravies. Fish sauce. Oyster sauce. Cocktail sauce. Horseradish that you find on the shelf. Ketchup. Mustard. Meat flavorings and tenderizers. Bouillon cubes. Hot sauce and Tabasco sauce. Premade or packaged marinades. Premade or packaged taco seasonings. Relishes. Regular salad dressings. Where to find more information:  National Heart, Lung, and Blood Institute: www.nhlbi.nih.gov  American Heart Association: www.heart.org Summary  The DASH eating plan is a healthy eating plan that has been shown to reduce high blood pressure (hypertension). It may also reduce your risk for type 2 diabetes, heart disease, and stroke.  With the DASH eating plan, you should limit salt (sodium) intake to 2,300 mg a day. If you have hypertension, you may need to reduce your sodium intake to 1,500 mg a day.  When on the DASH eating plan, aim to eat more fresh fruits and vegetables, whole grains, lean proteins, low-fat dairy, and heart-healthy fats.  Work with your health care provider or diet and nutrition specialist (dietitian) to adjust your eating plan to your individual   calorie needs. This information is not intended to replace advice given to you by your health care provider. Make sure you discuss any questions you have with your health care provider. Document Released: 02/08/2011 Document Revised: 02/13/2016 Document Reviewed: 02/13/2016 Elsevier Interactive Patient Education  2018 Elsevier Inc.  

## 2017-10-16 ENCOUNTER — Ambulatory Visit: Payer: Self-pay | Admitting: Physician Assistant

## 2018-01-07 ENCOUNTER — Other Ambulatory Visit (HOSPITAL_COMMUNITY)
Admission: RE | Admit: 2018-01-07 | Discharge: 2018-01-07 | Disposition: A | Discharge status: Home / Self Care | Payer: Self-pay | Source: Ambulatory Visit | Attending: Physician Assistant | Admitting: Physician Assistant

## 2018-01-07 DIAGNOSIS — E785 Hyperlipidemia, unspecified: Secondary | ICD-10-CM | POA: Insufficient documentation

## 2018-01-07 DIAGNOSIS — I10 Essential (primary) hypertension: Secondary | ICD-10-CM | POA: Insufficient documentation

## 2018-01-07 DIAGNOSIS — R945 Abnormal results of liver function studies: Secondary | ICD-10-CM | POA: Insufficient documentation

## 2018-01-07 DIAGNOSIS — Z125 Encounter for screening for malignant neoplasm of prostate: Secondary | ICD-10-CM | POA: Insufficient documentation

## 2018-01-07 DIAGNOSIS — R7989 Other specified abnormal findings of blood chemistry: Secondary | ICD-10-CM

## 2018-01-07 LAB — COMPREHENSIVE METABOLIC PANEL
ALBUMIN: 4.2 g/dL (ref 3.5–5.0)
ALT: 107 U/L — AB (ref 0–44)
AST: 75 U/L — AB (ref 15–41)
Alkaline Phosphatase: 74 U/L (ref 38–126)
Anion gap: 9 (ref 5–15)
BUN: 16 mg/dL (ref 6–20)
CO2: 25 mmol/L (ref 22–32)
CREATININE: 0.87 mg/dL (ref 0.61–1.24)
Calcium: 9 mg/dL (ref 8.9–10.3)
Chloride: 106 mmol/L (ref 98–111)
GFR calc Af Amer: >60 mL/min (ref >60)
GLUCOSE: 137 mg/dL — AB (ref 70–99)
POTASSIUM: 3.6 mmol/L (ref 3.5–5.1)
Sodium: 140 mmol/L (ref 135–145)
Total Bilirubin: 0.8 mg/dL (ref 0.3–1.2)
Total Protein: 7.3 g/dL (ref 6.5–8.1)

## 2018-01-07 LAB — LIPID PANEL
Cholesterol: 170 mg/dL (ref 0–200)
HDL: 42 mg/dL (ref >40)
LDL Cholesterol: 94 mg/dL (ref 0–99)
Total CHOL/HDL Ratio: 4 RATIO
Triglycerides: 170 mg/dL — ABNORMAL HIGH (ref <150)
VLDL: 34 mg/dL (ref 0–40)

## 2018-01-07 LAB — PSA: PROSTATIC SPECIFIC ANTIGEN: 0.55 ng/mL (ref 0.00–4.00)

## 2018-01-13 ENCOUNTER — Ambulatory Visit: Payer: Self-pay | Admitting: Physician Assistant

## 2018-01-16 ENCOUNTER — Other Ambulatory Visit: Payer: Self-pay | Admitting: Physician Assistant

## 2018-01-16 ENCOUNTER — Encounter: Payer: Self-pay | Admitting: Physician Assistant

## 2018-01-16 ENCOUNTER — Ambulatory Visit: Payer: Self-pay | Admitting: Physician Assistant

## 2018-01-16 VITALS — BP 144/86 | HR 61 | Temp 97.9°F | Ht 66.25 in | Wt 266.0 lb

## 2018-01-16 DIAGNOSIS — I10 Essential (primary) hypertension: Secondary | ICD-10-CM

## 2018-01-16 DIAGNOSIS — R945 Abnormal results of liver function studies: Secondary | ICD-10-CM

## 2018-01-16 DIAGNOSIS — Z1211 Encounter for screening for malignant neoplasm of colon: Secondary | ICD-10-CM

## 2018-01-16 DIAGNOSIS — F39 Unspecified mood [affective] disorder: Secondary | ICD-10-CM

## 2018-01-16 DIAGNOSIS — M4800 Spinal stenosis, site unspecified: Secondary | ICD-10-CM

## 2018-01-16 DIAGNOSIS — M549 Dorsalgia, unspecified: Secondary | ICD-10-CM

## 2018-01-16 DIAGNOSIS — R7989 Other specified abnormal findings of blood chemistry: Secondary | ICD-10-CM

## 2018-01-16 DIAGNOSIS — E785 Hyperlipidemia, unspecified: Secondary | ICD-10-CM

## 2018-01-16 DIAGNOSIS — R7303 Prediabetes: Secondary | ICD-10-CM

## 2018-01-16 DIAGNOSIS — G8929 Other chronic pain: Secondary | ICD-10-CM

## 2018-01-16 MED ORDER — DICLOFENAC SODIUM 1 % TD GEL: 2.0000 g | Freq: Four times a day (QID) | TRANSDERMAL | 1 refills | Status: DC | PRN

## 2018-01-16 MED ORDER — METOPROLOL TARTRATE 50 MG PO TABS: 50.0000 mg | ORAL_TABLET | Freq: Two times a day (BID) | ORAL | 4 refills | Status: DC

## 2018-01-16 NOTE — Progress Notes (Signed)
BP (!) 144/86 (BP Location: Left Arm, Patient Position: Sitting, Cuff Size: Normal)   Pulse 61   Temp 97.9 F (36.6 C)   Ht 5' 6.25" (1.683 m)   Wt 266 lb (120.7 kg)   SpO2 96%   BMI 42.61 kg/m    Subjective:    Patient ID: Tommy Right., male    DOB: [birth year], 57 y.o.   MRN: [id number]  HPI: [name]. is a 57 y.o. male presenting on 01/16/2018 for Hypertension and Hyperlipidemia   HPI Pt is doing well.  He is Still [facility]  Pt states he has drank more recently.   He says he usually only drinks  1 day/week but will drink more than a 6 pack when he does.  He denies APAP use.   Relevant past medical, surgical, family and social history reviewed and updated as indicated. Interim medical history since our last visit reviewed. Allergies and medications reviewed and updated.   Current Outpatient Medications:  .  allopurinol (ZYLOPRIM) 300 MG tablet, TAKE 1 Tablet BY MOUTH ONCE DAILY, Disp: 90 tablet, Rfl: 1 .  atorvastatin (LIPITOR) 20 MG tablet, TAKE 1 Tablet BY MOUTH ONCE DAILY, Disp: 90 tablet, Rfl: 1 .  citalopram (CELEXA) 20 MG tablet, Take 20 mg by mouth daily., Disp: , Rfl:  .  lisinopril (PRINIVIL,ZESTRIL) 20 MG tablet, TAKE 1 Tablet BY MOUTH ONCE DAILY, Disp: 90 tablet, Rfl: 1 .  metoprolol tartrate (LOPRESSOR) 25 MG tablet, TAKE 1 TABLET BY MOUTH TWICE DAILY, Disp: 60 tablet, Rfl: 3 .  saw palmetto 500 MG capsule, Take 500 mg by mouth daily., Disp: , Rfl:    Review of Systems  Constitutional: Negative for appetite change, chills, diaphoresis, fatigue, fever and unexpected weight change.  HENT: Negative for congestion, dental problem, drooling, ear pain, facial swelling, hearing loss, mouth sores, sneezing, sore throat, trouble swallowing and voice change.   Eyes: Negative for pain, discharge, redness, itching and visual disturbance.  Respiratory: Negative for cough, choking, shortness of breath and wheezing.   Cardiovascular: Negative for  chest pain, palpitations and leg swelling.  Gastrointestinal: Negative for abdominal pain, blood in stool, constipation, diarrhea and vomiting.  Endocrine: Positive for polydipsia. Negative for cold intolerance and heat intolerance.  Genitourinary: Negative for decreased urine volume, dysuria and hematuria.  Musculoskeletal: Positive for arthralgias and back pain. Negative for gait problem.  Skin: Negative for rash.  Allergic/Immunologic: Negative for environmental allergies.  Neurological: Negative for seizures, syncope, light-headedness and headaches.  Hematological: Negative for adenopathy.  Psychiatric/Behavioral: Negative for agitation, dysphoric mood and suicidal ideas. The patient is not nervous/anxious.     Per HPI unless specifically indicated above     Objective:    BP (!) 144/86 (BP Location: Left Arm, Patient Position: Sitting, Cuff Size: Normal)   Pulse 61   Temp 97.9 F (36.6 C)   Ht 5' 6.25" (1.683 m)   Wt 266 lb (120.7 kg)   SpO2 96%   BMI 42.61 kg/m   Wt Readings from Last 3 Encounters:  01/16/18 266 lb (120.7 kg)  07/17/17 253 lb 8 oz (115 kg)  04/17/17 255 lb (115.7 kg)    Physical Exam  Constitutional: He is oriented to person, place, and time. He appears well-developed and well-nourished.  HENT:  Head: Normocephalic and atraumatic.  Neck: Neck supple.  Cardiovascular: Normal rate and regular rhythm.  Pulmonary/Chest: Effort normal and breath sounds normal. He has no wheezes.  Abdominal: Soft. Bowel sounds are  normal. There is no hepatosplenomegaly. There is no tenderness.  Musculoskeletal: He exhibits no edema.  Lymphadenopathy:    He has no cervical adenopathy.  Neurological: He is alert and oriented to person, place, and time.  Skin: Skin is warm and dry.  Psychiatric: He has a normal mood and affect. His behavior is normal.  Vitals reviewed.   Results for orders placed or performed during the hospital encounter of 01/07/18  Lipid panel  Result  Value Ref Range   Cholesterol 170 0 - 200 mg/dL   Triglycerides 161170 (H) <150 mg/dL   HDL 42 >09>40 mg/dL   Total CHOL/HDL Ratio 4.0 RATIO   VLDL 34 0 - 40 mg/dL   LDL Cholesterol 94 0 - 99 mg/dL  Comprehensive metabolic panel  Result Value Ref Range   Sodium 140 135 - 145 mmol/L   Potassium 3.6 3.5 - 5.1 mmol/L   Chloride 106 98 - 111 mmol/L   CO2 25 22 - 32 mmol/L   Glucose, Bld 137 (H) 70 - 99 mg/dL   BUN 16 6 - 20 mg/dL   Creatinine, Ser 6.040.87 0.61 - 1.24 mg/dL   Calcium 9.0 8.9 - 54.010.3 mg/dL   Total Protein 7.3 6.5 - 8.1 g/dL   Albumin 4.2 3.5 - 5.0 g/dL   AST 75 (H) 15 - 41 U/L   ALT 107 (H) 0 - 44 U/L   Alkaline Phosphatase 74 38 - 126 U/L   Total Bilirubin 0.8 0.3 - 1.2 mg/dL   GFR calc non Af Amer >60 >60 mL/min   GFR calc Af Amer >60 >60 mL/min   Anion gap 9 5 - 15  PSA  Result Value Ref Range   Prostatic Specific Antigen 0.55 0.00 - 4.00 ng/mL      Assessment & Plan:   Encounter Diagnoses  Name Primary?  . Essential hypertension Yes  . Hyperlipidemia, unspecified hyperlipidemia type   . Elevated LFTs   . Mood disorder (HCC)   . Morbid obesity (HCC)   . Prediabetes   . Spinal stenosis, unspecified spinal region   . Chronic back pain, unspecified back location, unspecified back pain laterality   . Screening for colon cancer      -reviewed labs with pt -will Increase metoprolol for better bp control -urged pt to avoid etoh due to elevations of LFTs -pt was given iFOBT for colon cancer screening -rx voltaren gel per request -counseled pt on weight loss to help his LBP -[facility] -pt to follow up 3 months.  RTO sooner prn

## 2018-02-10 LAB — IFOBT (OCCULT BLOOD): IFOBT: NEGATIVE

## 2018-04-15 ENCOUNTER — Other Ambulatory Visit (HOSPITAL_COMMUNITY)
Admission: RE | Admit: 2018-04-15 | Discharge: 2018-04-15 | Disposition: A | Discharge status: Home / Self Care | Payer: Medicare Other | Source: Ambulatory Visit | Attending: Physician Assistant | Admitting: Physician Assistant

## 2018-04-15 DIAGNOSIS — R945 Abnormal results of liver function studies: Secondary | ICD-10-CM | POA: Diagnosis present

## 2018-04-15 DIAGNOSIS — R69 Illness, unspecified: Secondary | ICD-10-CM | POA: Diagnosis present

## 2018-04-15 DIAGNOSIS — I10 Essential (primary) hypertension: Secondary | ICD-10-CM | POA: Insufficient documentation

## 2018-04-15 DIAGNOSIS — R7989 Other specified abnormal findings of blood chemistry: Secondary | ICD-10-CM

## 2018-04-15 DIAGNOSIS — E785 Hyperlipidemia, unspecified: Secondary | ICD-10-CM | POA: Diagnosis present

## 2018-04-15 LAB — COMPREHENSIVE METABOLIC PANEL
ALT: 48 U/L — ABNORMAL HIGH (ref 0–44)
AST: 29 U/L (ref 15–41)
Albumin: 4.2 g/dL (ref 3.5–5.0)
Alkaline Phosphatase: 95 U/L (ref 38–126)
Anion gap: 10 (ref 5–15)
BUN: 12 mg/dL (ref 6–20)
CO2: 25 mmol/L (ref 22–32)
Calcium: 8.9 mg/dL (ref 8.9–10.3)
Chloride: 104 mmol/L (ref 98–111)
Creatinine, Ser: 0.97 mg/dL (ref 0.61–1.24)
GFR calc Af Amer: >60 mL/min (ref >60)
GFR calc non Af Amer: >60 mL/min (ref >60)
Glucose, Bld: 134 mg/dL — ABNORMAL HIGH (ref 70–99)
POTASSIUM: 4 mmol/L (ref 3.5–5.1)
Sodium: 139 mmol/L (ref 135–145)
Total Bilirubin: 0.9 mg/dL (ref 0.3–1.2)
Total Protein: 7 g/dL (ref 6.5–8.1)

## 2018-04-15 LAB — LIPID PANEL
Cholesterol: 137 mg/dL (ref 0–200)
HDL: 39 mg/dL — ABNORMAL LOW (ref >40)
LDL Cholesterol: 71 mg/dL (ref 0–99)
Total CHOL/HDL Ratio: 3.5 RATIO
Triglycerides: 136 mg/dL (ref <150)
VLDL: 27 mg/dL (ref 0–40)

## 2018-04-16 ENCOUNTER — Ambulatory Visit: Payer: Self-pay | Admitting: Physician Assistant

## 2018-04-16 ENCOUNTER — Encounter: Payer: Self-pay | Admitting: Physician Assistant

## 2018-04-16 VITALS — BP 130/76 | HR 60 | Temp 97.7°F | Wt 261.0 lb

## 2018-04-16 DIAGNOSIS — F39 Unspecified mood [affective] disorder: Secondary | ICD-10-CM

## 2018-04-16 DIAGNOSIS — I10 Essential (primary) hypertension: Secondary | ICD-10-CM

## 2018-04-16 DIAGNOSIS — N400 Enlarged prostate without lower urinary tract symptoms: Secondary | ICD-10-CM

## 2018-04-16 DIAGNOSIS — E785 Hyperlipidemia, unspecified: Secondary | ICD-10-CM

## 2018-04-16 MED ORDER — TAMSULOSIN HCL 0.4 MG PO CAPS: 0.4000 mg | ORAL_CAPSULE | Freq: Every day | ORAL | 6 refills | Status: DC

## 2018-04-16 NOTE — Progress Notes (Signed)
BP 130/76 (BP Location: Left Arm, Patient Position: Sitting, Cuff Size: Large)   Pulse 60   Temp 97.7 F (36.5 C)   Wt 261 lb (118.4 kg)   SpO2 98%   BMI 41.81 kg/m    Subjective:    Patient ID: [name]., male    DOB: 01/13/1961, 58 y.o.   MRN: [id number]  HPI: Safir Taulbee. is a 58 y.o. male presenting on 04/16/2018 for Hypertension and Hyperlipidemia   HPI   Pt is still going to [facility]  He is doing well today.  He says his only issue is he would like to try something to help with prostate issues- he complains of weak stream, dribbling, nocturia.  Denies burning or pain.   Relevant past medical, surgical, family and social history reviewed and updated as indicated. Interim medical history since our last visit reviewed. Allergies and medications reviewed and updated.    Current Outpatient Medications:  .  allopurinol (ZYLOPRIM) 300 MG tablet, TAKE 1 Tablet BY MOUTH ONCE DAILY, Disp: 90 tablet, Rfl: 1 .  atorvastatin (LIPITOR) 20 MG tablet, TAKE 1 Tablet BY MOUTH ONCE DAILY, Disp: 90 tablet, Rfl: 1 .  citalopram (CELEXA) 20 MG tablet, Take 20 mg by mouth daily., Disp: , Rfl:  .  diclofenac sodium (VOLTAREN) 1 % GEL, Apply 2 g topically 4 (four) times daily as needed (pain)., Disp: 100 g, Rfl: 1 .  lisinopril (PRINIVIL,ZESTRIL) 20 MG tablet, TAKE 1 Tablet BY MOUTH ONCE DAILY, Disp: 90 tablet, Rfl: 1 .  metoprolol tartrate (LOPRESSOR) 50 MG tablet, Take 1 tablet (50 mg total) by mouth 2 (two) times daily., Disp: 60 tablet, Rfl: 4 .  saw palmetto 500 MG capsule, Take 500 mg by mouth daily., Disp: , Rfl:    Review of Systems  Constitutional: Negative for appetite change, chills, diaphoresis, fatigue, fever and unexpected weight change.  HENT: Negative for congestion, dental problem, drooling, ear pain, facial swelling, hearing loss, mouth sores, sneezing, sore throat, trouble swallowing and voice change.   Eyes: Negative for pain, discharge, redness, itching and  visual disturbance.  Respiratory: Negative for cough, choking, shortness of breath and wheezing.   Cardiovascular: Negative for chest pain, palpitations and leg swelling.  Gastrointestinal: Negative for abdominal pain, blood in stool, constipation, diarrhea and vomiting.  Endocrine: Negative for cold intolerance, heat intolerance and polydipsia.  Genitourinary: Negative for decreased urine volume, dysuria and hematuria.  Musculoskeletal: Positive for arthralgias and back pain. Negative for gait problem.  Skin: Negative for rash.  Allergic/Immunologic: Negative for environmental allergies.  Neurological: Negative for seizures, syncope, light-headedness and headaches.  Hematological: Negative for adenopathy.  Psychiatric/Behavioral: Positive for dysphoric mood. Negative for agitation and suicidal ideas. The patient is nervous/anxious.     Per HPI unless specifically indicated above     Objective:    BP 130/76 (BP Location: Left Arm, Patient Position: Sitting, Cuff Size: Large)   Pulse 60   Temp 97.7 F (36.5 C)   Wt 261 lb (118.4 kg)   SpO2 98%   BMI 41.81 kg/m   Wt Readings from Last 3 Encounters:  04/16/18 261 lb (118.4 kg)  01/16/18 266 lb (120.7 kg)  07/17/17 253 lb 8 oz (115 kg)    Physical Exam Vitals signs reviewed.  Constitutional:      Appearance: He is well-developed. He is obese.  HENT:     Head: Normocephalic and atraumatic.  Neck:     Musculoskeletal: Neck supple.  Cardiovascular:  Rate and Rhythm: Normal rate and regular rhythm.  Pulmonary:     Effort: Pulmonary effort is normal.     Breath sounds: Normal breath sounds. No wheezing.  Abdominal:     General: Bowel sounds are normal.     Palpations: Abdomen is soft.     Tenderness: There is no abdominal tenderness.  Lymphadenopathy:     Cervical: No cervical adenopathy.  Skin:    General: Skin is warm and dry.  Neurological:     Mental Status: He is alert and oriented to person, place, and time.   Psychiatric:        Behavior: Behavior normal.     Results for orders placed or performed during the hospital encounter of 04/15/18  Lipid panel  Result Value Ref Range   Cholesterol 137 0 - 200 mg/dL   Triglycerides 300 <762 mg/dL   HDL 39 (L) >26 mg/dL   Total CHOL/HDL Ratio 3.5 RATIO   VLDL 27 0 - 40 mg/dL   LDL Cholesterol 71 0 - 99 mg/dL  Comprehensive metabolic panel  Result Value Ref Range   Sodium 139 135 - 145 mmol/L   Potassium 4.0 3.5 - 5.1 mmol/L   Chloride 104 98 - 111 mmol/L   CO2 25 22 - 32 mmol/L   Glucose, Bld 134 (H) 70 - 99 mg/dL   BUN 12 6 - 20 mg/dL   Creatinine, Ser 3.33 0.61 - 1.24 mg/dL   Calcium 8.9 8.9 - 54.5 mg/dL   Total Protein 7.0 6.5 - 8.1 g/dL   Albumin 4.2 3.5 - 5.0 g/dL   AST 29 15 - 41 U/L   ALT 48 (H) 0 - 44 U/L   Alkaline Phosphatase 95 38 - 126 U/L   Total Bilirubin 0.9 0.3 - 1.2 mg/dL   GFR calc non Af Amer >60 >60 mL/min   GFR calc Af Amer >60 >60 mL/min   Anion gap 10 5 - 15      Assessment & Plan:    Encounter Diagnoses  Name Primary?  . Essential hypertension Yes  . Hyperlipidemia, unspecified hyperlipidemia type   . Morbid obesity (HCC)   . Benign prostatic hyperplasia, unspecified whether lower urinary tract symptoms present   . Mood disorder (HCC)     -Reviewed labs with pt -pt to continue current medications -Trial of flomax for prostate complaints -pt to follow up 6 months.  RTO sooner prn

## 2018-05-20 ENCOUNTER — Other Ambulatory Visit: Payer: Self-pay | Admitting: Physician Assistant

## 2018-07-01 ENCOUNTER — Other Ambulatory Visit: Payer: Self-pay | Admitting: Physician Assistant

## 2018-07-01 MED ORDER — ATORVASTATIN CALCIUM 20 MG PO TABS: 20.0000 mg | ORAL_TABLET | Freq: Every day | ORAL | 1 refills | Status: AC

## 2018-07-01 MED ORDER — LISINOPRIL 20 MG PO TABS: 20.0000 mg | ORAL_TABLET | Freq: Every day | ORAL | 1 refills | Status: DC

## 2018-07-01 MED ORDER — TAMSULOSIN HCL 0.4 MG PO CAPS: 0.4000 mg | ORAL_CAPSULE | Freq: Every day | ORAL | 1 refills | Status: AC

## 2018-07-01 MED ORDER — METOPROLOL TARTRATE 50 MG PO TABS: 50.0000 mg | ORAL_TABLET | Freq: Two times a day (BID) | ORAL | 1 refills | Status: DC

## 2018-07-01 MED ORDER — ALLOPURINOL 300 MG PO TABS: 300.0000 mg | ORAL_TABLET | Freq: Every day | ORAL | 1 refills | Status: AC

## 2018-10-15 ENCOUNTER — Ambulatory Visit: Payer: Self-pay | Admitting: Physician Assistant

## 2018-10-21 ENCOUNTER — Other Ambulatory Visit: Payer: Self-pay

## 2018-10-21 DIAGNOSIS — Z20822 Contact with and (suspected) exposure to covid-19: Secondary | ICD-10-CM

## 2018-10-22 LAB — NOVEL CORONAVIRUS, NAA: SARS-CoV-2, NAA: NOT DETECTED

## 2019-06-11 LAB — COLOGUARD: COLOGUARD: NEGATIVE

## 2019-09-13 ENCOUNTER — Encounter (HOSPITAL_COMMUNITY): Payer: Self-pay

## 2019-09-13 ENCOUNTER — Emergency Department (HOSPITAL_COMMUNITY): Payer: Medicare Other

## 2019-09-13 ENCOUNTER — Emergency Department (HOSPITAL_COMMUNITY)
Admission: EM | Admit: 2019-09-13 | Discharge: 2019-09-13 | Disposition: A | Discharge status: Home / Self Care | Payer: Medicare Other | Attending: Emergency Medicine | Admitting: Emergency Medicine

## 2019-09-13 ENCOUNTER — Other Ambulatory Visit: Payer: Self-pay

## 2019-09-13 DIAGNOSIS — S46911A Strain of unspecified muscle, fascia and tendon at shoulder and upper arm level, right arm, initial encounter: Secondary | ICD-10-CM | POA: Insufficient documentation

## 2019-09-13 DIAGNOSIS — I10 Essential (primary) hypertension: Secondary | ICD-10-CM | POA: Diagnosis not present

## 2019-09-13 DIAGNOSIS — Y939 Activity, unspecified: Secondary | ICD-10-CM | POA: Insufficient documentation

## 2019-09-13 DIAGNOSIS — Y92009 Unspecified place in unspecified non-institutional (private) residence as the place of occurrence of the external cause: Secondary | ICD-10-CM | POA: Insufficient documentation

## 2019-09-13 DIAGNOSIS — Y999 Unspecified external cause status: Secondary | ICD-10-CM | POA: Insufficient documentation

## 2019-09-13 DIAGNOSIS — X58XXXA Exposure to other specified factors, initial encounter: Secondary | ICD-10-CM | POA: Diagnosis not present

## 2019-09-13 DIAGNOSIS — Z79899 Other long term (current) drug therapy: Secondary | ICD-10-CM | POA: Insufficient documentation

## 2019-09-13 MED ORDER — HYDROCODONE-ACETAMINOPHEN 5-325 MG PO TABS: 1.0000 | ORAL_TABLET | ORAL | 0 refills | Status: DC | PRN

## 2019-09-13 MED ORDER — PREDNISONE 10 MG (21) PO TBPK: ORAL_TABLET | ORAL | 0 refills | Status: DC

## 2019-09-13 MED ORDER — DEXAMETHASONE SODIUM PHOSPHATE 10 MG/ML IJ SOLN
10.0000 mg | Freq: Once | INTRAMUSCULAR | Status: AC
  Administered 2019-09-13: 10 mg via INTRAMUSCULAR
  Filled 2019-09-13: qty 1

## 2019-09-13 NOTE — ED Triage Notes (Signed)
Pt reports a bird got in his house last night and he was trying to get it out. Pt looked in cabinet and bird flew out, scared him and he jerked his right shoulder. Pt reports unable to raise arm and hand is numb

## 2019-09-13 NOTE — ED Provider Notes (Signed)
[facility] EMERGENCY DEPARTMENT Provider Note   CSN: [id number] Arrival date & time: 09/13/19  0813     History Chief Complaint  Patient presents with  . Shoulder Injury    [name]. is a 59 y.o. male.  Pt presents to the ED today with right shoulder pain.  Pt said a bird got into his house and he was holding his right arm up over his head with a flashlight trying to see the bird.  The bird flew out of a cabinet and he jerked his right arm back while it was up over his head.  It hurt, but he could move it afterwards.  This am, he can't raise his right arm and his fingers feel numb.        Past Medical History:  Diagnosis Date  . Anxiety   . Bladder stone   . Depression   . GERD (gastroesophageal reflux disease)   . Gout   . Hypertension   . Kidney stones   . Sleep apnea     Patient Active Problem List   Diagnosis Date Noted  . Hyperlipidemia 07/17/2017  . History of fusion of cervical spine 01/22/2017  . Essential hypertension 01/22/2017  . Spinal stenosis 01/22/2017    Past Surgical History:  Procedure Laterality Date  . BICEPS TENDON REPAIR Left   . CERVICAL FUSION    . HERNIA REPAIR Right    inguinal  . SHOULDER ARTHROSCOPY WITH ROTATOR CUFF REPAIR AND SUBACROMIAL DECOMPRESSION Left 07/29/2012   Procedure: LEFT SHOULDER ARTHROSCOPY WITH SUBACROMIAL DECOMPRESSION, DISTAL CLAVICLE RESECTION/ROTATOR CUFF REPAIR ;  Surgeon: Senaida Lange, MD;  Location: [facility] OR;  Service: Orthopedics;  Laterality: Left;  . SHOULDER SURGERY    . UMBILICAL HERNIA REPAIR         Family History  Problem Relation Age of Onset  . Suicidality Mother   . Lung disease Father     Social History   Tobacco Use  . Smoking status: Never Smoker  . Smokeless tobacco: Current User    Types: Snuff  Vaping Use  . Vaping Use: Never used  Substance Use Topics  . Alcohol use: Yes    Alcohol/week: 6.0 standard drinks    Types: 6 Cans of beer per week    Comment: drinks 3-4  beers once a week  . Drug use: No    Types: "Crack" cocaine, Marijuana    Comment: Last cocaine - 2008.Last MJ 10/2016.    Home Medications Prior to Admission medications   Medication Sig Start Date End Date Taking? Authorizing Provider  allopurinol (ZYLOPRIM) 300 MG tablet Take 1 tablet (300 mg total) by mouth daily. 07/01/18   Jacquelin Hawking, PA-C  atorvastatin (LIPITOR) 20 MG tablet Take 1 tablet (20 mg total) by mouth daily. 07/01/18   Jacquelin Hawking, PA-C  citalopram (CELEXA) 20 MG tablet Take 20 mg by mouth daily.    [provider]  diclofenac sodium (VOLTAREN) 1 % GEL Apply 2 g topically 4 (four) times daily as needed (pain). 01/16/18   Jacquelin Hawking, PA-C  HYDROcodone-acetaminophen (NORCO/VICODIN) 5-325 MG tablet Take 1 tablet by mouth every 4 (four) hours as needed. 09/13/19   Jacalyn Lefevre, MD  lisinopril (ZESTRIL) 20 MG tablet Take 1 tablet (20 mg total) by mouth daily. 07/01/18   Jacquelin Hawking, PA-C  metoprolol tartrate (LOPRESSOR) 50 MG tablet Take 1 tablet (50 mg total) by mouth 2 (two) times daily. 07/01/18   Jacquelin Hawking, PA-C  predniSONE (STERAPRED UNI-PAK 21  TAB) 10 MG (21) TBPK tablet Take 6 tabs for 2 days, then 5 for 2 days, then 4 for 2 days, then 3 for 2 days, 2 for 2 days, then 1 for 2 days 09/13/19   Jacalyn Lefevre, MD  saw palmetto 500 MG capsule Take 500 mg by mouth daily.    [provider]  tamsulosin (FLOMAX) 0.4 MG CAPS capsule Take 1 capsule (0.4 mg total) by mouth daily. 07/01/18   Jacquelin Hawking, PA-C    Allergies    Kiwi extract and Strawberry extract  Review of Systems   Review of Systems  Musculoskeletal:       Right shoulder pain  All other systems reviewed and are negative.   Physical Exam Updated Vital Signs BP 123/76 (BP Location: Left Arm)   Pulse (!) 51   Temp 97.6 F (36.4 C) (Oral)   Resp 16   Ht 5\' 7"  (1.702 m)   Wt 120.2 kg   SpO2 97%   BMI 41.50 kg/m   Physical Exam Vitals and nursing note  reviewed.  Constitutional:      Appearance: Normal appearance.  HENT:     Head: Normocephalic and atraumatic.     Right Ear: External ear normal.     Left Ear: External ear normal.     Nose: Nose normal.     Mouth/Throat:     Mouth: Mucous membranes are moist.     Pharynx: Oropharynx is clear.  Eyes:     Extraocular Movements: Extraocular movements intact.     Conjunctiva/sclera: Conjunctivae normal.     Pupils: Pupils are equal, round, and reactive to light.  Cardiovascular:     Rate and Rhythm: Normal rate and regular rhythm.     Pulses: Normal pulses.     Heart sounds: Normal heart sounds.  Pulmonary:     Effort: Pulmonary effort is normal.     Breath sounds: Normal breath sounds.  Abdominal:     General: Abdomen is flat. Bowel sounds are normal.     Palpations: Abdomen is soft.  Musculoskeletal:     Right shoulder: Decreased range of motion.     Cervical back: Normal range of motion and neck supple.  Skin:    General: Skin is warm.     Capillary Refill: Capillary refill takes less than 2 seconds.  Neurological:     General: No focal deficit present.     Mental Status: He is alert and oriented to person, place, and time.  Psychiatric:        Mood and Affect: Mood normal.        Behavior: Behavior normal.        Thought Content: Thought content normal.        Judgment: Judgment normal.     ED Results / Procedures / Treatments   Labs (all labs ordered are listed, but only abnormal results are displayed) Labs Reviewed - No data to display  EKG None  Radiology DG Shoulder Right  Result Date: 09/13/2019 CLINICAL DATA:  Pain after sudden motion EXAM: RIGHT SHOULDER - 2+ VIEW COMPARISON:  None. FINDINGS: Oblique, Y scapular, and axillary images were obtained. No fracture or dislocation. There is mild generalized joint space narrowing. No erosive change or intra-articular calcification. Visualized right lung clear. There is postoperative change in the lower cervical  region. IMPRESSION: Mild generalized osteoarthritic change.  No fracture or dislocation. Electronically Signed   By: 11/14/2019 III M.D.   On: 09/13/2019 09:09  Procedures Procedures (including critical care time)  Medications Ordered in ED Medications  dexamethasone (DECADRON) injection 10 mg (has no administration in time range)    ED Course  I have reviewed the triage vital signs and the nursing notes.  Pertinent labs & imaging results that were available during my care of the patient were reviewed by me and considered in my medical decision making (see chart for details).    MDM Rules/Calculators/A&P                          Xray shows no dislocation/fx.  Pt likely injured his rotator cuff.  He will be given decadron and a shoulder sling in the ED.  He is to f/u with ortho.  Return if worse.   Final Clinical Impression(s) / ED Diagnoses Final diagnoses:  Strain of right shoulder, initial encounter    Rx / DC Orders ED Discharge Orders         Ordered    predniSONE (STERAPRED UNI-PAK 21 TAB) 10 MG (21) TBPK tablet     Discontinue  Reprint     09/13/19 0918    HYDROcodone-acetaminophen (NORCO/VICODIN) 5-325 MG tablet  Every 4 hours PRN     Discontinue  Reprint     09/13/19 2481           Jacalyn Lefevre, MD 09/13/19 [id number]

## 2019-09-17 ENCOUNTER — Ambulatory Visit (INDEPENDENT_AMBULATORY_CARE_PROVIDER_SITE_OTHER): Payer: Medicare Other | Admitting: Orthopaedic Surgery

## 2019-09-17 ENCOUNTER — Encounter: Payer: Self-pay | Admitting: Orthopaedic Surgery

## 2019-09-17 ENCOUNTER — Other Ambulatory Visit: Payer: Self-pay

## 2019-09-17 VITALS — BP 153/86 | HR 56 | Ht 67.0 in | Wt 268.4 lb

## 2019-09-17 DIAGNOSIS — M25511 Pain in right shoulder: Secondary | ICD-10-CM | POA: Diagnosis not present

## 2019-09-17 DIAGNOSIS — Z6841 Body Mass Index (BMI) 40.0 and over, adult: Secondary | ICD-10-CM | POA: Diagnosis not present

## 2019-09-17 MED ORDER — NAPROXEN 500 MG PO TABS: 500.0000 mg | ORAL_TABLET | Freq: Two times a day (BID) | ORAL | 5 refills | Status: DC

## 2019-09-17 NOTE — Progress Notes (Signed)
Subjective:    Patient ID: Tommy Right., male    DOB: May 17, 1960, 59 y.o.   MRN: [id number]  HPI A bird flew into his house on 09-13-2019.  He was holding a flashlight over his head trying to see the bird.  The bird flew out of a cabinet right toward him.  He jerked his arm back quickly into hyper extension.  He felt a pop and pain in the right shoulder.  The bird eventually flew out of his house.  He went to the ER on 7-11.2021.  I have reviewed the notes and x-rays.  I have independently reviewed and interpreted x-rays of this patient done at another site by another physician or qualified health professional.  He was given prednisone which has helped.  At first he had great difficulty in moving his right arm but now can raise it up slowly with pain.  He has no numbness, no redness, no swelling.  He is being seen in PT for neck pain, chronic.  I will have PT work with the shoulder as well.  Review of Systems  Constitutional: Positive for activity change.  Musculoskeletal: Positive for arthralgias.  All other systems reviewed and are negative.  For Review of Systems, all other systems reviewed and are negative.  The following is a summary of the past history medically, past history surgically, known current medicines, social history and family history.  This information is gathered electronically by the computer from prior information and documentation.  I review this each visit and have found including this information at this point in the chart is beneficial and informative.   Past Medical History:  Diagnosis Date  . Anxiety   . Bladder stone   . Depression   . GERD (gastroesophageal reflux disease)   . Gout   . Hypertension   . Kidney stones   . Sleep apnea     Past Surgical History:  Procedure Laterality Date  . BICEPS TENDON REPAIR Left   . CERVICAL FUSION    . HERNIA REPAIR Right    inguinal  . SHOULDER ARTHROSCOPY WITH ROTATOR CUFF REPAIR AND SUBACROMIAL  DECOMPRESSION Left 07/29/2012   Procedure: LEFT SHOULDER ARTHROSCOPY WITH SUBACROMIAL DECOMPRESSION, DISTAL CLAVICLE RESECTION/ROTATOR CUFF REPAIR ;  Surgeon: Senaida Lange, MD;  Location: [facility] OR;  Service: Orthopedics;  Laterality: Left;  . SHOULDER SURGERY    . UMBILICAL HERNIA REPAIR      Current Outpatient Medications on File Prior to Visit  Medication Sig Dispense Refill  . allopurinol (ZYLOPRIM) 300 MG tablet Take 1 tablet (300 mg total) by mouth daily. 30 tablet 1  . ALPRAZolam (XANAX XR) 0.5 MG 24 hr tablet Take 0.5 mg by mouth daily. As needed    . amLODipine (NORVASC) 5 MG tablet Take 5 mg by mouth daily.    Marland Kitchen atorvastatin (LIPITOR) 20 MG tablet Take 1 tablet (20 mg total) by mouth daily. 30 tablet 1  . diclofenac sodium (VOLTAREN) 1 % GEL Apply 2 g topically 4 (four) times daily as needed (pain). 100 g 1  . gabapentin (NEURONTIN) 300 MG capsule Take 300 mg by mouth 3 (three) times daily.    Marland Kitchen HYDROcodone-acetaminophen (NORCO/VICODIN) 5-325 MG tablet Take 1 tablet by mouth every 4 (four) hours as needed. 10 tablet 0  . imipramine (TOFRANIL) 10 MG tablet Take 10 mg by mouth at bedtime. Taking it 3 times a day    . lisinopril (ZESTRIL) 20 MG tablet Take 1 tablet (20 mg total)  by mouth daily. 30 tablet 1  . metoprolol tartrate (LOPRESSOR) 50 MG tablet Take 1 tablet (50 mg total) by mouth 2 (two) times daily. 60 tablet 1  . predniSONE (STERAPRED UNI-PAK 21 TAB) 10 MG (21) TBPK tablet Take 6 tabs for 2 days, then 5 for 2 days, then 4 for 2 days, then 3 for 2 days, 2 for 2 days, then 1 for 2 days 42 tablet 0  . saw palmetto 500 MG capsule Take 500 mg by mouth daily.    . sertraline (ZOLOFT) 100 MG tablet Take 100 mg by mouth daily. 3 times a day    . tamsulosin (FLOMAX) 0.4 MG CAPS capsule Take 1 capsule (0.4 mg total) by mouth daily. 30 capsule 1  . citalopram (CELEXA) 20 MG tablet Take 20 mg by mouth daily. (Patient not taking: Reported on 09/17/2019)     No current facility-administered  medications on file prior to visit.    Social History   Socioeconomic History  . Marital status: Married    Spouse name: Not on file  . Number of children: Not on file  . Years of education: Not on file  . Highest education level: Not on file  Occupational History  . Not on file  Tobacco Use  . Smoking status: Never Smoker  . Smokeless tobacco: Current User    Types: Snuff  Vaping Use  . Vaping Use: Never used  Substance and Sexual Activity  . Alcohol use: Yes    Alcohol/week: 6.0 standard drinks    Types: 6 Cans of beer per week    Comment: drinks 3-4 beers once a week  . Drug use: No    Types: "Crack" cocaine, Marijuana    Comment: Last cocaine - 2008.Last MJ 10/2016.  Marland Kitchen Sexual activity: Not on file  Other Topics Concern  . Not on file  Social History Narrative  . Not on file   Social Determinants of Health   Financial Resource Strain:   . Difficulty of Paying Living Expenses:   Food Insecurity:   . Worried About Programme researcher, broadcasting/film/video in the Last Year:   . Barista in the Last Year:   Transportation Needs:   . Freight forwarder (Medical):   Marland Kitchen Lack of Transportation (Non-Medical):   Physical Activity:   . Days of Exercise per Week:   . Minutes of Exercise per Session:   Stress:   . Feeling of Stress :   Social Connections:   . Frequency of Communication with Friends and Family:   . Frequency of Social Gatherings with Friends and Family:   . Attends Religious Services:   . Active Member of Clubs or Organizations:   . Attends Banker Meetings:   Marland Kitchen Marital Status:   Intimate Partner Violence:   . Fear of Current or Ex-Partner:   . Emotionally Abused:   Marland Kitchen Physically Abused:   . Sexually Abused:     Family History  Problem Relation Age of Onset  . Suicidality Mother   . Lung disease Father     BP (!) 153/86   Pulse (!) 56   Ht 5\' 7"  (1.702 m)   Wt 268 lb 6 oz (121.7 kg)   BMI 42.03 kg/m   Body mass index is 42.03 kg/m.      Objective:   Physical Exam Vitals and nursing note reviewed.  Constitutional:      Appearance: He is well-developed.  HENT:     Head:  Normocephalic and atraumatic.  Eyes:     Conjunctiva/sclera: Conjunctivae normal.     Pupils: Pupils are equal, round, and reactive to light.  Cardiovascular:     Rate and Rhythm: Normal rate and regular rhythm.  Pulmonary:     Effort: Pulmonary effort is normal.  Abdominal:     Palpations: Abdomen is soft.  Musculoskeletal:       Arms:     Cervical back: Normal range of motion and neck supple.  Skin:    General: Skin is warm and dry.  Neurological:     Mental Status: He is alert and oriented to person, place, and time.     Cranial Nerves: No cranial nerve deficit.     Motor: No abnormal muscle tone.     Coordination: Coordination normal.     Deep Tendon Reflexes: Reflexes are normal and symmetric. Reflexes normal.  Psychiatric:        Behavior: Behavior normal.        Thought Content: Thought content normal.        Judgment: Judgment normal.           Assessment & Plan:   Encounter Diagnoses  Name Primary?  . Acute pain of right shoulder Yes  . Body mass index 40.0-44.9, adult (HCC)   . Morbid obesity (HCC)    I will begin Naprosyn 500 po bid pc  Continue PT, add it for shoulder.  Return in two weeks.  He may need MRI if not improving.  Call if any problem.  Precautions discussed.   Electronically Signed Darreld Mclean, MD 7/15/20219:16 AM

## 2019-10-01 ENCOUNTER — Ambulatory Visit (INDEPENDENT_AMBULATORY_CARE_PROVIDER_SITE_OTHER): Payer: Medicare Other | Admitting: Orthopaedic Surgery

## 2019-10-01 ENCOUNTER — Other Ambulatory Visit: Payer: Self-pay

## 2019-10-01 ENCOUNTER — Encounter: Payer: Self-pay | Admitting: Orthopaedic Surgery

## 2019-10-01 VITALS — BP 117/75 | HR 60 | Ht 67.0 in | Wt 265.0 lb

## 2019-10-01 DIAGNOSIS — M25511 Pain in right shoulder: Secondary | ICD-10-CM | POA: Diagnosis not present

## 2019-10-01 DIAGNOSIS — Z6841 Body Mass Index (BMI) 40.0 and over, adult: Secondary | ICD-10-CM | POA: Diagnosis not present

## 2019-10-01 NOTE — Progress Notes (Signed)
Patient Tommy Yaw Escoto., male DOB:[birth year], 59 y.o. EVO:[id number]  Chief Complaint  Patient presents with  . Shoulder Pain    right shld s/p PT- doing better    HPI  [name]. is a 59 y.o. male who has right shoulder pain.  He has been going to PT at [facility].  I do not have copies of their notes. He says he is better. He has less pain and no numbness.  He has no new trauma.   Body mass index is 41.5 kg/m.  The patient meets the AMA guidelines for Morbid (severe) obesity with a BMI > 40.0 and I have recommended weight loss.   ROS  Review of Systems  Constitutional: Positive for activity change.  Musculoskeletal: Positive for arthralgias.  All other systems reviewed and are negative.   All other systems reviewed and are negative.  The following is a summary of the past history medically, past history surgically, known current medicines, social history and family history.  This information is gathered electronically by the computer from prior information and documentation.  I review this each visit and have found including this information at this point in the chart is beneficial and informative.    Past Medical History:  Diagnosis Date  . Anxiety   . Bladder stone   . Depression   . GERD (gastroesophageal reflux disease)   . Gout   . Hypertension   . Kidney stones   . Sleep apnea     Past Surgical History:  Procedure Laterality Date  . BICEPS TENDON REPAIR Left   . CERVICAL FUSION    . HERNIA REPAIR Right    inguinal  . SHOULDER ARTHROSCOPY WITH ROTATOR CUFF REPAIR AND SUBACROMIAL DECOMPRESSION Left 07/29/2012   Procedure: LEFT SHOULDER ARTHROSCOPY WITH SUBACROMIAL DECOMPRESSION, DISTAL CLAVICLE RESECTION/ROTATOR CUFF REPAIR ;  Surgeon: Senaida Lange, MD;  Location: [facility] OR;  Service: Orthopedics;  Laterality: Left;  . SHOULDER SURGERY    . UMBILICAL HERNIA REPAIR      Family History  Problem Relation Age of Onset  . Suicidality Mother    . Lung disease Father     Social History Social History   Tobacco Use  . Smoking status: Never Smoker  . Smokeless tobacco: Current User    Types: Snuff  Vaping Use  . Vaping Use: Never used  Substance Use Topics  . Alcohol use: Yes    Alcohol/week: 6.0 standard drinks    Types: 6 Cans of beer per week    Comment: drinks 3-4 beers once a week  . Drug use: No    Types: "Crack" cocaine, Marijuana    Comment: Last cocaine - 2008.Last MJ 10/2016.    Allergies  Allergen Reactions  . Kiwi Extract Swelling    Tongue swelling  . Strawberry Extract Swelling    Tongue swelling    Current Outpatient Medications  Medication Sig Dispense Refill  . allopurinol (ZYLOPRIM) 300 MG tablet Take 1 tablet (300 mg total) by mouth daily. 30 tablet 1  . ALPRAZolam (XANAX XR) 0.5 MG 24 hr tablet Take 0.5 mg by mouth daily. As needed    . amLODipine (NORVASC) 5 MG tablet Take 5 mg by mouth daily.    Marland Kitchen atorvastatin (LIPITOR) 20 MG tablet Take 1 tablet (20 mg total) by mouth daily. 30 tablet 1  . citalopram (CELEXA) 20 MG tablet Take 20 mg by mouth daily.     . diclofenac sodium (VOLTAREN) 1 % GEL Apply 2  g topically 4 (four) times daily as needed (pain). 100 g 1  . gabapentin (NEURONTIN) 300 MG capsule Take 300 mg by mouth 3 (three) times daily.    Marland Kitchen HYDROcodone-acetaminophen (NORCO/VICODIN) 5-325 MG tablet Take 1 tablet by mouth every 4 (four) hours as needed. 10 tablet 0  . imipramine (TOFRANIL) 10 MG tablet Take 10 mg by mouth at bedtime. Taking it 3 times a day    . lisinopril (ZESTRIL) 20 MG tablet Take 1 tablet (20 mg total) by mouth daily. 30 tablet 1  . metoprolol tartrate (LOPRESSOR) 50 MG tablet Take 1 tablet (50 mg total) by mouth 2 (two) times daily. 60 tablet 1  . naproxen (NAPROSYN) 500 MG tablet Take 1 tablet (500 mg total) by mouth 2 (two) times daily with a meal. 60 tablet 5  . saw palmetto 500 MG capsule Take 500 mg by mouth daily.    . sertraline (ZOLOFT) 100 MG tablet Take 100  mg by mouth daily. 3 times a day    . tamsulosin (FLOMAX) 0.4 MG CAPS capsule Take 1 capsule (0.4 mg total) by mouth daily. 30 capsule 1  . predniSONE (STERAPRED UNI-PAK 21 TAB) 10 MG (21) TBPK tablet Take 6 tabs for 2 days, then 5 for 2 days, then 4 for 2 days, then 3 for 2 days, 2 for 2 days, then 1 for 2 days (Patient not taking: Reported on 10/01/2019) 42 tablet 0   No current facility-administered medications for this visit.     Physical Exam  Blood pressure 117/75, pulse 60, height 5\' 7"  (1.702 m), weight (!) 265 lb (120.2 kg).  Constitutional: overall normal hygiene, normal nutrition, well developed, normal grooming, normal body habitus. Assistive device:none  Musculoskeletal: gait and station Limp none, muscle tone and strength are normal, no tremors or atrophy is present.  .  Neurological: coordination overall normal.  Deep tendon reflex/nerve stretch intact.  Sensation normal.  Cranial nerves II-XII intact.   Skin:   Normal overall no scars, lesions, ulcers or rashes. No psoriasis.  Psychiatric: Alert and oriented x 3.  Recent memory intact, remote memory unclear.  Normal mood and affect. Well groomed.  Good eye contact.  Cardiovascular: overall no swelling, no varicosities, no edema bilaterally, normal temperatures of the legs and arms, no clubbing, cyanosis and good capillary refill.  Lymphatic: palpation is normal.  Right shoulder with full ROM and pain in the extremes.  NV intact.  All other systems reviewed and are negative   The patient has been educated about the nature of the problem(s) and counseled on treatment options.  The patient appeared to understand what I have discussed and is in agreement with it.  Encounter Diagnoses  Name Primary?  . Acute pain of right shoulder Yes  . Body mass index 40.0-44.9, adult (HCC)   . Morbid obesity (HCC)     PLAN Call if any problems.  Precautions discussed.  Continue current medications.   Return to clinic 3  weeks   Continue PT.  Electronically Signed 12-16-1981, MD 7/29/202111:11 AM

## 2019-10-22 ENCOUNTER — Encounter: Payer: Self-pay | Admitting: Orthopaedic Surgery

## 2019-10-22 ENCOUNTER — Ambulatory Visit (INDEPENDENT_AMBULATORY_CARE_PROVIDER_SITE_OTHER): Payer: Medicare Other | Admitting: Orthopaedic Surgery

## 2019-10-22 ENCOUNTER — Other Ambulatory Visit: Payer: Self-pay

## 2019-10-22 VITALS — Ht 67.0 in | Wt 269.0 lb

## 2019-10-22 DIAGNOSIS — G8929 Other chronic pain: Secondary | ICD-10-CM | POA: Diagnosis not present

## 2019-10-22 DIAGNOSIS — M25511 Pain in right shoulder: Secondary | ICD-10-CM | POA: Diagnosis not present

## 2019-10-22 NOTE — Progress Notes (Signed)
Patient Tommy Sollie Vultaggio., male DOB:06/25/1960, 59 y.o. VVZ:[id number]  Chief Complaint  Patient presents with  . Shoulder Pain    right shoulder and he was feeling better and working in the yard it got worse.     HPI  [name]. is a 59 y.o. male who has continued pain in the right shoulder.  He went to PT at [facility].  I still have no notes from them.  He is not improving.  He has pain with overhead use and with sleeping on it.  He has no numbness.  I am concerned about rotator cuff injury.  I will get MRI.   Body mass index is 42.13 kg/m.  The patient meets the AMA guidelines for Morbid (severe) obesity with a BMI > 40.0 and I have recommended weight loss.   ROS  Review of Systems  Constitutional: Positive for activity change.  Musculoskeletal: Positive for arthralgias.  All other systems reviewed and are negative.   All other systems reviewed and are negative.  The following is a summary of the past history medically, past history surgically, known current medicines, social history and family history.  This information is gathered electronically by the computer from prior information and documentation.  I review this each visit and have found including this information at this point in the chart is beneficial and informative.    Past Medical History:  Diagnosis Date  . Anxiety   . Bladder stone   . Depression   . GERD (gastroesophageal reflux disease)   . Gout   . Hypertension   . Kidney stones   . Sleep apnea     Past Surgical History:  Procedure Laterality Date  . BICEPS TENDON REPAIR Left   . CERVICAL FUSION    . HERNIA REPAIR Right    inguinal  . SHOULDER ARTHROSCOPY WITH ROTATOR CUFF REPAIR AND SUBACROMIAL DECOMPRESSION Left 07/29/2012   Procedure: LEFT SHOULDER ARTHROSCOPY WITH SUBACROMIAL DECOMPRESSION, DISTAL CLAVICLE RESECTION/ROTATOR CUFF REPAIR ;  Surgeon: Senaida Lange, MD;  Location: [facility] OR;  Service: Orthopedics;  Laterality: Left;   . SHOULDER SURGERY    . UMBILICAL HERNIA REPAIR      Family History  Problem Relation Age of Onset  . Suicidality Mother   . Lung disease Father     Social History Social History   Tobacco Use  . Smoking status: Never Smoker  . Smokeless tobacco: Current User    Types: Snuff  Vaping Use  . Vaping Use: Never used  Substance Use Topics  . Alcohol use: Yes    Alcohol/week: 6.0 standard drinks    Types: 6 Cans of beer per week    Comment: drinks 3-4 beers once a week  . Drug use: No    Types: "Crack" cocaine, Marijuana    Comment: Last cocaine - 2008.Last MJ 10/2016.    Allergies  Allergen Reactions  . Kiwi Extract Swelling    Tongue swelling  . Strawberry Extract Swelling    Tongue swelling    Current Outpatient Medications  Medication Sig Dispense Refill  . allopurinol (ZYLOPRIM) 300 MG tablet Take 1 tablet (300 mg total) by mouth daily. 30 tablet 1  . ALPRAZolam (XANAX XR) 0.5 MG 24 hr tablet Take 0.5 mg by mouth daily. As needed    . amLODipine (NORVASC) 5 MG tablet Take 5 mg by mouth daily.    Marland Kitchen atorvastatin (LIPITOR) 20 MG tablet Take 1 tablet (20 mg total) by mouth daily. 30 tablet  1  . citalopram (CELEXA) 20 MG tablet Take 20 mg by mouth daily.     . diclofenac sodium (VOLTAREN) 1 % GEL Apply 2 g topically 4 (four) times daily as needed (pain). 100 g 1  . gabapentin (NEURONTIN) 300 MG capsule Take 300 mg by mouth 3 (three) times daily.    Marland Kitchen HYDROcodone-acetaminophen (NORCO/VICODIN) 5-325 MG tablet Take 1 tablet by mouth every 4 (four) hours as needed. 10 tablet 0  . lisinopril (ZESTRIL) 20 MG tablet Take 1 tablet (20 mg total) by mouth daily. 30 tablet 1  . metoprolol tartrate (LOPRESSOR) 50 MG tablet Take 1 tablet (50 mg total) by mouth 2 (two) times daily. 60 tablet 1  . naproxen (NAPROSYN) 500 MG tablet Take 1 tablet (500 mg total) by mouth 2 (two) times daily with a meal. 60 tablet 5  . saw palmetto 500 MG capsule Take 500 mg by mouth daily.    .  sertraline (ZOLOFT) 100 MG tablet Take 100 mg by mouth daily. 3 times a day    . tamsulosin (FLOMAX) 0.4 MG CAPS capsule Take 1 capsule (0.4 mg total) by mouth daily. 30 capsule 1   No current facility-administered medications for this visit.     Physical Exam  Height 5\' 7"  (1.702 m), weight 269 lb (122 kg).  Constitutional: overall normal hygiene, normal nutrition, well developed, normal grooming, normal body habitus. Assistive device:none  Musculoskeletal: gait and station Limp none, muscle tone and strength are normal, no tremors or atrophy is present.  .  Neurological: coordination overall normal.  Deep tendon reflex/nerve stretch intact.  Sensation normal.  Cranial nerves II-XII intact.   Skin:   Normal overall no scars, lesions, ulcers or rashes. No psoriasis.  Psychiatric: Alert and oriented x 3.  Recent memory intact, remote memory unclear.  Normal mood and affect. Well groomed.  Good eye contact.  Cardiovascular: overall no swelling, no varicosities, no edema bilaterally, normal temperatures of the legs and arms, no clubbing, cyanosis and good capillary refill.  Lymphatic: palpation is normal.  All other systems reviewed and are negative   The patient has been educated about the nature of the problem(s) and counseled on treatment options.  The patient appeared to understand what I have discussed and is in agreement with it.  Encounter Diagnosis  Name Primary?  . Chronic right shoulder pain Yes   PROCEDURE NOTE:  The patient request injection, verbal consent was obtained.  The right shoulder was prepped appropriately after time out was performed.   Sterile technique was observed and injection of 1 cc of Depo-Medrol 40 mg with several cc's of plain xylocaine. Anesthesia was provided by ethyl chloride and a 20-gauge needle was used to inject the shoulder area. A posterior approach was used.  The injection was tolerated well.  A band aid dressing was applied.  The  patient was advised to apply ice later today and tomorrow to the injection sight as needed.   PLAN Call if any problems.  Precautions discussed.  Continue current medications.   Return to clinic 2 weeks   Get MRI.  Electronically Signed , MD 8/19/20218:19 AM

## 2019-10-29 ENCOUNTER — Other Ambulatory Visit: Payer: Self-pay | Admitting: Orthopedic Surgery

## 2019-10-29 ENCOUNTER — Telehealth: Payer: Self-pay | Admitting: Orthopaedic Surgery

## 2019-10-29 DIAGNOSIS — G8929 Other chronic pain: Secondary | ICD-10-CM

## 2019-10-29 MED ORDER — HYDROCODONE-ACETAMINOPHEN 5-325 MG PO TABS: 1.0000 | ORAL_TABLET | ORAL | 0 refills | Status: DC | PRN

## 2019-10-29 NOTE — Telephone Encounter (Signed)
Patient is requesting pain medicine from Dr. Hilda Lias [phone]

## 2019-10-29 NOTE — Telephone Encounter (Signed)
I called in a rx

## 2019-10-29 NOTE — Telephone Encounter (Signed)
Can you advise since Dr Hilda Lias is out?   I do not see that Dr Hilda Lias has given meds before.  Patient is pending an MRI Rt Shld then f/u here on 11/10/19 to review.

## 2019-10-29 NOTE — Progress Notes (Signed)
Meds ordered this encounter  Medications  . HYDROcodone-acetaminophen (NORCO/VICODIN) 5-325 MG tablet    Sig: Take 1 tablet by mouth every 4 (four) hours as needed.    Dispense:  10 tablet    Refill:  0

## 2019-10-30 NOTE — Telephone Encounter (Signed)
Will you please call pt and make sure he knows Dr Romeo Apple sent in a Rx?  Thanks.

## 2019-11-05 ENCOUNTER — Ambulatory Visit: Payer: Medicare Other | Admitting: Orthopaedic Surgery

## 2019-11-06 ENCOUNTER — Ambulatory Visit (HOSPITAL_COMMUNITY)
Admission: RE | Admit: 2019-11-06 | Discharge: 2019-11-06 | Disposition: A | Discharge status: Home / Self Care | Payer: Medicare Other | Source: Ambulatory Visit | Attending: Orthopaedic Surgery | Admitting: Orthopaedic Surgery

## 2019-11-06 ENCOUNTER — Other Ambulatory Visit: Payer: Self-pay

## 2019-11-06 DIAGNOSIS — M25511 Pain in right shoulder: Secondary | ICD-10-CM | POA: Diagnosis present

## 2019-11-06 DIAGNOSIS — G8929 Other chronic pain: Secondary | ICD-10-CM | POA: Diagnosis present

## 2019-11-10 ENCOUNTER — Other Ambulatory Visit: Payer: Self-pay

## 2019-11-10 ENCOUNTER — Encounter: Payer: Self-pay | Admitting: Orthopaedic Surgery

## 2019-11-10 ENCOUNTER — Ambulatory Visit (INDEPENDENT_AMBULATORY_CARE_PROVIDER_SITE_OTHER): Payer: Medicare Other | Admitting: Orthopaedic Surgery

## 2019-11-10 VITALS — BP 150/90 | HR 55 | Ht 67.0 in | Wt 272.0 lb

## 2019-11-10 DIAGNOSIS — G8929 Other chronic pain: Secondary | ICD-10-CM | POA: Diagnosis not present

## 2019-11-10 DIAGNOSIS — Z6841 Body Mass Index (BMI) 40.0 and over, adult: Secondary | ICD-10-CM | POA: Diagnosis not present

## 2019-11-10 DIAGNOSIS — M25511 Pain in right shoulder: Secondary | ICD-10-CM

## 2019-11-10 MED ORDER — HYDROCODONE-ACETAMINOPHEN 5-325 MG PO TABS: ORAL_TABLET | ORAL | 0 refills | Status: DC

## 2019-11-10 NOTE — Progress Notes (Signed)
Patient GU:RKYH Tommy Parker., male DOB:17-Jun-1960, 59 y.o. CWC:[id number]  Chief Complaint  Patient presents with  . Shoulder Pain    Rt shoulder    HPI  Tommy Trejos. is a 59 y.o. male who has continued pain of the right shoulder.  He had MRI done which showed: IMPRESSION: 1. Full-thickness full width rupture of the supraspinatus tendon retracted back 4.1 cm to the plane of the glenoid. 2. Full-thickness full width tear of the infraspinatus tendon retracted back 3.5 cm nearly to the plane of the glenoid. Edema in the infraspinatus muscle likely reflecting denervation edema from recent rupture of the tendon. 3. Mild to moderate subscapularis tendinopathy. 4. Fatty atrophy of the teres minor muscle probably from prior quadrilateral space syndrome. 5. Partial tearing of the intra-articular segment of the long head of the biceps. 6. Moderate degenerative AC joint and glenohumeral joint arthropathy.  I have independently reviewed the MRI.     I have explained the findings to him.  I will have him see Dr. Romeo Apple and see if he is a candidate for surgery.  He has had rotator cuff surgery on the left in the past.  He is somewhat familiar with the procedure.  He has been release by [facility] PT.   Body mass index is 42.6 kg/m.  ROS  Review of Systems  Constitutional: Positive for activity change.  Musculoskeletal: Positive for arthralgias.  All other systems reviewed and are negative.   All other systems reviewed and are negative.  The following is a summary of the past history medically, past history surgically, known current medicines, social history and family history.  This information is gathered electronically by the computer from prior information and documentation.  I review this each visit and have found including this information at this point in the chart is beneficial and informative.    Past Medical History:  Diagnosis Date  . Anxiety   . Bladder  stone   . Depression   . GERD (gastroesophageal reflux disease)   . Gout   . Hypertension   . Kidney stones   . Sleep apnea     Past Surgical History:  Procedure Laterality Date  . BICEPS TENDON REPAIR Left   . CERVICAL FUSION    . HERNIA REPAIR Right    inguinal  . SHOULDER ARTHROSCOPY WITH ROTATOR CUFF REPAIR AND SUBACROMIAL DECOMPRESSION Left 07/29/2012   Procedure: LEFT SHOULDER ARTHROSCOPY WITH SUBACROMIAL DECOMPRESSION, DISTAL CLAVICLE RESECTION/ROTATOR CUFF REPAIR ;  Surgeon: Senaida Lange, MD;  Location: [facility] OR;  Service: Orthopedics;  Laterality: Left;  . SHOULDER SURGERY    . UMBILICAL HERNIA REPAIR      Family History  Problem Relation Age of Onset  . Suicidality Mother   . Lung disease Father     Social History Social History   Tobacco Use  . Smoking status: Never Smoker  . Smokeless tobacco: Current User    Types: Snuff  Vaping Use  . Vaping Use: Never used  Substance Use Topics  . Alcohol use: Yes    Alcohol/week: 6.0 standard drinks    Types: 6 Cans of beer per week    Comment: drinks 3-4 beers once a week  . Drug use: No    Types: "Crack" cocaine, Marijuana    Comment: Last cocaine - 2008.Last MJ 10/2016.    Allergies  Allergen Reactions  . Kiwi Extract Swelling    Tongue swelling  . Strawberry Extract Swelling    Tongue swelling  Current Outpatient Medications  Medication Sig Dispense Refill  . allopurinol (ZYLOPRIM) 300 MG tablet Take 1 tablet (300 mg total) by mouth daily. 30 tablet 1  . ALPRAZolam (XANAX XR) 0.5 MG 24 hr tablet Take 0.5 mg by mouth daily. As needed    . amLODipine (NORVASC) 5 MG tablet Take 5 mg by mouth daily.    Marland Kitchen atorvastatin (LIPITOR) 20 MG tablet Take 1 tablet (20 mg total) by mouth daily. 30 tablet 1  . diclofenac sodium (VOLTAREN) 1 % GEL Apply 2 g topically 4 (four) times daily as needed (pain). 100 g 1  . gabapentin (NEURONTIN) 300 MG capsule Take 300 mg by mouth 3 (three) times daily.    Marland Kitchen  HYDROcodone-acetaminophen (NORCO/VICODIN) 5-325 MG tablet Take 1 tablet by mouth every 4 (four) hours as needed. 10 tablet 0  . lisinopril (ZESTRIL) 20 MG tablet Take 1 tablet (20 mg total) by mouth daily. 30 tablet 1  . metoprolol tartrate (LOPRESSOR) 50 MG tablet Take 1 tablet (50 mg total) by mouth 2 (two) times daily. 60 tablet 1  . naproxen (NAPROSYN) 500 MG tablet Take 1 tablet (500 mg total) by mouth 2 (two) times daily with a meal. 60 tablet 5  . saw palmetto 500 MG capsule Take 500 mg by mouth daily.    . sertraline (ZOLOFT) 100 MG tablet Take 100 mg by mouth daily. 3 times a day    . tamsulosin (FLOMAX) 0.4 MG CAPS capsule Take 1 capsule (0.4 mg total) by mouth daily. 30 capsule 1  . citalopram (CELEXA) 20 MG tablet Take 20 mg by mouth daily.  (Patient not taking: Reported on 11/10/2019)     No current facility-administered medications for this visit.     Physical Exam  Blood pressure (!) 150/90, pulse (!) 55, height 5\' 7"  (1.702 m), weight 272 lb (123.4 kg).  Constitutional: overall normal hygiene, normal nutrition, well developed, normal grooming, normal body habitus. Assistive device:none  Musculoskeletal: gait and station Limp none, muscle tone and strength are normal, no tremors or atrophy is present.  .  Neurological: coordination overall normal.  Deep tendon reflex/nerve stretch intact.  Sensation normal.  Cranial nerves II-XII intact.   Skin:   Normal overall no scars, lesions, ulcers or rashes. No psoriasis.  Psychiatric: Alert and oriented x 3.  Recent memory intact, remote memory unclear.  Normal mood and affect. Well groomed.  Good eye contact.  Cardiovascular: overall no swelling, no varicosities, no edema bilaterally, normal temperatures of the legs and arms, no clubbing, cyanosis and good capillary refill.  Lymphatic: palpation is normal.  Right shoulder with pain, fairly good ROM, NV intact.   All other systems reviewed and are negative   The patient has  been educated about the nature of the problem(s) and counseled on treatment options.  The patient appeared to understand what I have discussed and is in agreement with it.  Encounter Diagnoses  Name Primary?  . Chronic right shoulder pain Yes  . Body mass index 40.0-44.9, adult (HCC)   . Morbid obesity (HCC)     PLAN Call if any problems.  Precautions discussed.  Continue current medications.   Return to clinic to see Dr. 12-16-1981   I have reviewed the [facility] Controlled Substance Reporting System web site prior to prescribing narcotic medicine for this patient.   Electronically Signed [facility], MD 9/7/202110:09 AM

## 2019-11-10 NOTE — Patient Instructions (Signed)
Appointment with Dr Romeo Apple for surgery consult rotator cuff

## 2019-11-12 ENCOUNTER — Other Ambulatory Visit: Payer: Self-pay

## 2019-11-12 ENCOUNTER — Encounter: Payer: Self-pay | Admitting: Orthopedic Surgery

## 2019-11-12 ENCOUNTER — Ambulatory Visit (INDEPENDENT_AMBULATORY_CARE_PROVIDER_SITE_OTHER): Payer: Medicare Other | Admitting: Orthopedic Surgery

## 2019-11-12 VITALS — BP 136/85 | HR 56 | Wt 268.0 lb

## 2019-11-12 DIAGNOSIS — M4802 Spinal stenosis, cervical region: Secondary | ICD-10-CM

## 2019-11-12 DIAGNOSIS — M75121 Complete rotator cuff tear or rupture of right shoulder, not specified as traumatic: Secondary | ICD-10-CM

## 2019-11-12 NOTE — Patient Instructions (Signed)
RETURN 3 MONTHS AFTER NECK SURGERY WITH NEUROSURGERY APPROVAL TO SEE DR. CAIRNS FOR POSSIBLE REVERSE SHOULDER

## 2019-11-12 NOTE — Progress Notes (Signed)
Chief Complaint  Patient presents with  . Shoulder Pain    right shoulder pain, Consult for possible surgery.    Rt sh 53 month   59 year old male presents for possible shoulder surgery with a positive MRI for complete rotator cuff tear supraspinatus infraspinatus with retraction of 3 to 5 cm glenohumeral arthritis and AC joint arthritis with complaints of pain and decreased range of motion over the last month.  He was treated a month ago reinjured the shoulder and since that time he has had progressive loss of motion progressive loss of strength and increasing pain  However he does have some medical problems sleep apnea he was supposed to have a cervical spine fusion but did not have insurance so never had it.  He has had 1 C-spine procedure with a fusion and then needed a second.  His MRI of that issue showed that he had spinal cord stenosis and impingement he also has sleep apnea and hypertension  Past Medical History:  Diagnosis Date  . Anxiety   . Bladder stone   . Depression   . GERD (gastroesophageal reflux disease)   . Gout   . Hypertension   . Kidney stones   . Sleep apnea    Past Surgical History:  Procedure Laterality Date  . BICEPS TENDON REPAIR Left   . CERVICAL FUSION    . HERNIA REPAIR Right    inguinal  . SHOULDER ARTHROSCOPY WITH ROTATOR CUFF REPAIR AND SUBACROMIAL DECOMPRESSION Left 07/29/2012   Procedure: LEFT SHOULDER ARTHROSCOPY WITH SUBACROMIAL DECOMPRESSION, DISTAL CLAVICLE RESECTION/ROTATOR CUFF REPAIR ;  Surgeon: Senaida Lange, MD;  Location: [facility] OR;  Service: Orthopedics;  Laterality: Left;  . SHOULDER SURGERY    . UMBILICAL HERNIA REPAIR     Review of Systems  Respiratory: Positive for shortness of breath.   Cardiovascular: Negative for chest pain.   Physical Exam Vitals and nursing note reviewed.  Constitutional:      General: He is not in acute distress.    Appearance: He is obese.  HENT:     Head: Normocephalic.  Eyes:     General: No scleral  icterus.       Right eye: No discharge.        Left eye: No discharge.     Extraocular Movements: Extraocular movements intact.     Pupils: Pupils are equal, round, and reactive to light.  Cardiovascular:     Rate and Rhythm: Normal rate.     Pulses: Normal pulses.  Musculoskeletal:     Cervical back: Neck supple. No rigidity or tenderness.     Comments: At 0 degrees abduction he has 50 degrees external rotation he has 100 degrees of passive abduction and 150 degrees of passive flexion and he could lower the arm with good control  He did have a weakness in the supraspinatus and empty can position sign was positive shoulder was otherwise stable  Skin:    General: Skin is warm and dry.  Neurological:     General: No focal deficit present.     Mental Status: He is alert and oriented to person, place, and time. Mental status is at baseline.     Gait: Gait normal.  Psychiatric:        Mood and Affect: Mood normal.        Behavior: Behavior normal.        Thought Content: Thought content normal.        Judgment: Judgment normal.    Encounter Diagnoses  Name Primary?  . Cervical stenosis of spinal canal Yes  . Complete tear of right rotator cuff, unspecified whether traumatic     Taysean has serious neck issue here The MRI from 2018 which led to decision for surgery Report reading:  Central disc protrusion and osteophyte at C4-5 with severe spinal stenosis and cord compression. Probable mild cord hyperintensity at this level  His shoulder MRI has been independently reviewed and I read this as a severe rotator cuff tear with some massive tear and has severe retraction of 4 to 5 cm to the level of the glenoid he has atrophy of the teres minor as well as glenohumeral arthritis   I spoke to him about this and tried to explain to him that we cannot put him to sleep because we can extend his neck and he will need to have that fixed first and then if that can be done 3 months after that he  would be a candidate for either a reverse shoulder replacement or capsular reconstruction and he will see our shoulder specialist for that.

## 2019-11-30 ENCOUNTER — Other Ambulatory Visit: Payer: Self-pay | Admitting: Orthopedic Surgery

## 2019-11-30 NOTE — Telephone Encounter (Signed)
Patient requests refill on Hydrocodone/Acetaminophen 5-325  Mgs.  Qty  28  Sig: One tablet every six hours for pain. Limit 7 days.  Patient states he uses Psychologist, forensic in [place]

## 2019-11-30 NOTE — Telephone Encounter (Signed)
Was prescribed by DrKeeling You sent to Neurosurgery then told him to follow up here after neck surgery / Neurosurgery appointment pending review at [facility]

## 2019-12-15 ENCOUNTER — Other Ambulatory Visit: Payer: Self-pay | Admitting: Student

## 2019-12-15 DIAGNOSIS — M542 Cervicalgia: Secondary | ICD-10-CM | POA: Insufficient documentation

## 2019-12-15 DIAGNOSIS — Z6841 Body Mass Index (BMI) 40.0 and over, adult: Secondary | ICD-10-CM | POA: Insufficient documentation

## 2020-01-06 ENCOUNTER — Ambulatory Visit
Admission: RE | Admit: 2020-01-06 | Discharge: 2020-01-06 | Disposition: A | Discharge status: Home / Self Care | Payer: Medicare Other | Source: Ambulatory Visit | Attending: Student | Admitting: Student

## 2020-01-06 ENCOUNTER — Other Ambulatory Visit: Payer: Self-pay

## 2020-01-06 DIAGNOSIS — M542 Cervicalgia: Secondary | ICD-10-CM

## 2020-01-12 ENCOUNTER — Other Ambulatory Visit: Payer: Self-pay | Admitting: Neurological Surgery

## 2020-02-11 ENCOUNTER — Encounter (HOSPITAL_COMMUNITY)
Admission: RE | Admit: 2020-02-11 | Discharge: 2020-02-11 | Disposition: A | Discharge status: Home / Self Care | Payer: Medicare Other | Source: Ambulatory Visit | Attending: Neurological Surgery | Admitting: Neurological Surgery

## 2020-02-11 ENCOUNTER — Ambulatory Visit (HOSPITAL_COMMUNITY)
Admission: RE | Admit: 2020-02-11 | Discharge: 2020-02-11 | Disposition: A | Discharge status: Home / Self Care | Payer: Medicare Other | Source: Ambulatory Visit | Attending: Neurological Surgery | Admitting: Neurological Surgery

## 2020-02-11 ENCOUNTER — Encounter (HOSPITAL_COMMUNITY): Payer: Self-pay

## 2020-02-11 ENCOUNTER — Other Ambulatory Visit: Payer: Self-pay

## 2020-02-11 ENCOUNTER — Other Ambulatory Visit (HOSPITAL_COMMUNITY)
Admission: RE | Admit: 2020-02-11 | Discharge: 2020-02-11 | Disposition: A | Discharge status: Home / Self Care | Payer: Medicare Other | Source: Ambulatory Visit | Attending: Neurological Surgery | Admitting: Neurological Surgery

## 2020-02-11 DIAGNOSIS — A4901 Methicillin susceptible Staphylococcus aureus infection, unspecified site: Secondary | ICD-10-CM | POA: Diagnosis not present

## 2020-02-11 DIAGNOSIS — Z01818 Encounter for other preprocedural examination: Secondary | ICD-10-CM | POA: Diagnosis present

## 2020-02-11 DIAGNOSIS — M4802 Spinal stenosis, cervical region: Secondary | ICD-10-CM

## 2020-02-11 DIAGNOSIS — Z20822 Contact with and (suspected) exposure to covid-19: Secondary | ICD-10-CM | POA: Diagnosis not present

## 2020-02-11 HISTORY — DX: Personal history of urinary calculi: Z87.442

## 2020-02-11 HISTORY — DX: Type 2 diabetes mellitus without complications: E11.9

## 2020-02-11 LAB — CBC WITH DIFFERENTIAL/PLATELET
Abs Immature Granulocytes: 0.03 10*3/uL (ref 0.00–0.07)
Basophils Absolute: 0 10*3/uL (ref 0.0–0.1)
Basophils Relative: 0 %
Eosinophils Absolute: 0.2 10*3/uL (ref 0.0–0.5)
Eosinophils Relative: 3 %
HCT: 43.8 % (ref 39.0–52.0)
Hemoglobin: 14.5 g/dL (ref 13.0–17.0)
Immature Granulocytes: 0 %
Lymphocytes Relative: 33 %
Lymphs Abs: 2.8 10*3/uL (ref 0.7–4.0)
MCH: 30.3 pg (ref 26.0–34.0)
MCHC: 33.1 g/dL (ref 30.0–36.0)
MCV: 91.4 fL (ref 80.0–100.0)
Monocytes Absolute: 0.7 10*3/uL (ref 0.1–1.0)
Monocytes Relative: 8 %
Neutro Abs: 4.9 10*3/uL (ref 1.7–7.7)
Neutrophils Relative %: 56 %
Platelets: 188 10*3/uL (ref 150–400)
RBC: 4.79 MIL/uL (ref 4.22–5.81)
RDW: 12.6 % (ref 11.5–15.5)
WBC: 8.7 10*3/uL (ref 4.0–10.5)
nRBC: 0 % (ref 0.0–0.2)

## 2020-02-11 LAB — SURGICAL PCR SCREEN
MRSA, PCR: NEGATIVE
Staphylococcus aureus: POSITIVE — AB

## 2020-02-11 LAB — BASIC METABOLIC PANEL
Anion gap: 10 (ref 5–15)
BUN: 11 mg/dL (ref 6–20)
CO2: 27 mmol/L (ref 22–32)
Calcium: 9.1 mg/dL (ref 8.9–10.3)
Chloride: 105 mmol/L (ref 98–111)
Creatinine, Ser: 0.95 mg/dL (ref 0.61–1.24)
GFR, Estimated: >60 mL/min (ref >60)
Glucose, Bld: 107 mg/dL — ABNORMAL HIGH (ref 70–99)
Potassium: 4.2 mmol/L (ref 3.5–5.1)
Sodium: 142 mmol/L (ref 135–145)

## 2020-02-11 LAB — GLUCOSE, CAPILLARY: Glucose-Capillary: 114 mg/dL — ABNORMAL HIGH (ref 70–99)

## 2020-02-11 LAB — PROTIME-INR
INR: 1 (ref 0.8–1.2)
Prothrombin Time: 12.6 seconds (ref 11.4–15.2)

## 2020-02-11 LAB — SARS CORONAVIRUS 2 (TAT 6-24 HRS): SARS Coronavirus 2: NEGATIVE

## 2020-02-11 LAB — HEMOGLOBIN A1C
Hgb A1c MFr Bld: 6.7 % — ABNORMAL HIGH (ref 4.8–5.6)
Mean Plasma Glucose: 145.59 mg/dL

## 2020-02-11 NOTE — Progress Notes (Addendum)
[facility] [address] [place] - [address] [place] [address] [place] [address] Phone: [phone] Fax: [phone]  [facility], [place] - [address], [place] [address] [place] [address] [place] [address] Phone: [phone] Fax: [phone]      Your procedure is scheduled on Monday, December 13th.  Report to [facility] Main Entrance "A" at 5:30 A.M., and check in at the Admitting office.  Call this number if you have problems the morning of surgery:  [phone]  Call [phone] if you have any questions prior to your surgery date Monday-Friday 8am-4pm    Remember:  Do not eat or drink after midnight the night before your surgery    Take these medicines the morning of surgery with A SIP OF WATER   Tylenol - if needed  Allopurinol (Zyloprim)  Alprazolam (Xanax)  Amlodipine (Norvasc)  Atorvastatin (Lipitor)  Gabapentin (Neurontin)  Metoprolol Tartrate (Lopressor)  Oxycodone-Acetaminophen  Sertraline (Zoloft)  Do NOT take Metformin on the day of surgery  As of today, STOP taking any Aspirin (unless otherwise instructed by your surgeon) Aleve, Naproxen, Ibuprofen, Motrin, Advil, Goody's, BC's, all herbal medications, fish oil, and all vitamins.                  Do not wear jewelry            Do not wear lotions, powders, colognes, or deodorant.            Men may shave face and neck.            Do not bring valuables to the hospital.            [facility] is not responsible for any belongings or valuables.  Do NOT Smoke (Tobacco/Vaping) or drink Alcohol 24 hours prior to your procedure If you use a CPAP at night, you may bring all equipment for your overnight stay.   Contacts, glasses, dentures or bridgework may not be worn into surgery.      For patients admitted to the hospital, discharge time will be determined by your treatment team.   Patients discharged the day of surgery will not be allowed to drive home, and  someone needs to stay with them for 24 hours.    Special instructions:   [facility]- Preparing For Surgery  Before surgery, you can play an important role. Because skin is not sterile, your skin needs to be as free of germs as possible. You can reduce the number of germs on your skin by washing with CHG (chlorahexidine gluconate) Soap before surgery.  CHG is an antiseptic cleaner which kills germs and bonds with the skin to continue killing germs even after washing.    Oral Hygiene is also important to reduce your risk of infection.  Remember - BRUSH YOUR TEETH THE MORNING OF SURGERY WITH YOUR REGULAR TOOTHPASTE  Please do not use if you have an allergy to CHG or antibacterial soaps. If your skin becomes reddened/irritated stop using the CHG.  Do not shave (including legs and underarms) for at least 48 hours prior to first CHG shower. It is OK to shave your face.  Please follow these instructions carefully.   1. Shower the NIGHT BEFORE SURGERY and the MORNING OF SURGERY with CHG Soap.   2. If you chose to wash your hair, wash your hair first as usual with your normal shampoo.  3. After you shampoo, rinse your hair and  body thoroughly to remove the shampoo.  4. Use CHG as you would any other liquid soap. You can apply CHG directly to the skin and wash gently with a scrungie or a clean washcloth.   5. Apply the CHG Soap to your body ONLY FROM THE NECK DOWN.  Do not use on open wounds or open sores. Avoid contact with your eyes, ears, mouth and genitals (private parts). Wash Face and genitals (private parts)  with your normal soap.   6. Wash thoroughly, paying special attention to the area where your surgery will be performed.  7. Thoroughly rinse your body with warm water from the neck down.  8. DO NOT shower/wash with your normal soap after using and rinsing off the CHG Soap.  9. Pat yourself dry with a CLEAN TOWEL.  10. Wear CLEAN PAJAMAS to bed the night before  surgery  11. Place CLEAN SHEETS on your bed the night of your first shower and DO NOT SLEEP WITH PETS.   Day of Surgery: Wear Clean/Comfortable clothing the morning of surgery Do not apply any deodorants/lotions.   Remember to brush your teeth WITH YOUR REGULAR TOOTHPASTE.   Please read over the following fact sheets that you were given.

## 2020-02-11 NOTE — Progress Notes (Signed)
PCP - Donzetta Sprung Cardiologist - denies  Chest x-ray - 02-11-20 EKG - 02-11-20  SA - yes, wears CPAP  DM - yes, type 2, does not check sugars at home  COVID TEST- 02-11-20   Anesthesia review: n/a  Patient denies shortness of breath, fever, cough and chest pain at PAT appointment   All instructions explained to the patient, with a verbal understanding of the material. Patient agrees to go over the instructions while at home for a better understanding. Patient also instructed to self quarantine after being tested for COVID-19. The opportunity to ask questions was provided.

## 2020-02-12 MED ORDER — DEXTROSE 5 % IV SOLN
3.0000 g | INTRAVENOUS | Status: DC
  Filled 2020-02-12 (×2): qty 3000

## 2020-02-14 NOTE — Anesthesia Preprocedure Evaluation (Addendum)
Anesthesia Evaluation  Patient identified by MRN, date of birth, ID band Patient awake    Reviewed: Allergy & Precautions, NPO status , Patient's Chart, lab work & pertinent test results  History of Anesthesia Complications Negative for: history of anesthetic complications  Airway Mallampati: II  TM Distance: >3 FB Neck ROM: Full    Dental no notable dental hx. (+) Dental Advisory Given   Pulmonary sleep apnea ,    Pulmonary exam normal        Cardiovascular hypertension, Pt. on medications and Pt. on home beta blockers Normal cardiovascular exam     Neuro/Psych PSYCHIATRIC DISORDERS Anxiety Depression negative neurological ROS     GI/Hepatic Neg liver ROS, GERD  ,  Endo/Other  diabetesMorbid obesity  Renal/GU negative Renal ROS     Musculoskeletal negative musculoskeletal ROS (+)   Abdominal   Peds  Hematology negative hematology ROS (+)   Anesthesia Other Findings   Reproductive/Obstetrics                            Anesthesia Physical Anesthesia Plan  ASA: III  Anesthesia Plan: General   Post-op Pain Management:    Induction: Intravenous  PONV Risk Score and Plan: 4 or greater and Ondansetron, Dexamethasone, Midazolam and Treatment may vary due to age or medical condition  Airway Management Planned: Oral ETT  Additional Equipment:   Intra-op Plan:   Post-operative Plan: Extubation in OR  Informed Consent: I have reviewed the patients History and Physical, chart, labs and discussed the procedure including the risks, benefits and alternatives for the proposed anesthesia with the patient or authorized representative who has indicated his/her understanding and acceptance.     Dental advisory given  Plan Discussed with: Anesthesiologist and CRNA  Anesthesia Plan Comments:        Anesthesia Quick Evaluation

## 2020-02-15 ENCOUNTER — Ambulatory Visit (HOSPITAL_COMMUNITY): Payer: Medicare Other | Admitting: Anesthesiology

## 2020-02-15 ENCOUNTER — Encounter (HOSPITAL_COMMUNITY)
Admission: RE | Disposition: A | Discharge status: Home / Self Care | Payer: Self-pay | Source: Home / Self Care | Attending: Neurological Surgery

## 2020-02-15 ENCOUNTER — Observation Stay (HOSPITAL_COMMUNITY)
Admission: RE | Admit: 2020-02-15 | Discharge: 2020-02-16 | Disposition: A | Discharge status: Home / Self Care | Payer: Medicare Other | Attending: Neurological Surgery | Admitting: Neurological Surgery

## 2020-02-15 ENCOUNTER — Other Ambulatory Visit: Payer: Self-pay

## 2020-02-15 ENCOUNTER — Encounter (HOSPITAL_COMMUNITY): Payer: Self-pay | Admitting: Neurological Surgery

## 2020-02-15 ENCOUNTER — Ambulatory Visit (HOSPITAL_COMMUNITY): Payer: Medicare Other

## 2020-02-15 DIAGNOSIS — M2578 Osteophyte, vertebrae: Secondary | ICD-10-CM | POA: Insufficient documentation

## 2020-02-15 DIAGNOSIS — Z7984 Long term (current) use of oral hypoglycemic drugs: Secondary | ICD-10-CM | POA: Diagnosis not present

## 2020-02-15 DIAGNOSIS — E119 Type 2 diabetes mellitus without complications: Secondary | ICD-10-CM | POA: Insufficient documentation

## 2020-02-15 DIAGNOSIS — Z87891 Personal history of nicotine dependence: Secondary | ICD-10-CM | POA: Insufficient documentation

## 2020-02-15 DIAGNOSIS — M4712 Other spondylosis with myelopathy, cervical region: Principal | ICD-10-CM | POA: Insufficient documentation

## 2020-02-15 DIAGNOSIS — Z79899 Other long term (current) drug therapy: Secondary | ICD-10-CM | POA: Insufficient documentation

## 2020-02-15 DIAGNOSIS — M4802 Spinal stenosis, cervical region: Secondary | ICD-10-CM | POA: Diagnosis not present

## 2020-02-15 DIAGNOSIS — Z981 Arthrodesis status: Secondary | ICD-10-CM

## 2020-02-15 DIAGNOSIS — M542 Cervicalgia: Secondary | ICD-10-CM | POA: Diagnosis present

## 2020-02-15 DIAGNOSIS — I10 Essential (primary) hypertension: Secondary | ICD-10-CM | POA: Diagnosis not present

## 2020-02-15 DIAGNOSIS — Z419 Encounter for procedure for purposes other than remedying health state, unspecified: Secondary | ICD-10-CM

## 2020-02-15 HISTORY — PX: ANTERIOR CERVICAL DECOMP/DISCECTOMY FUSION: SHX1161

## 2020-02-15 LAB — GLUCOSE, CAPILLARY
Glucose-Capillary: 132 mg/dL — ABNORMAL HIGH (ref 70–99)
Glucose-Capillary: 152 mg/dL — ABNORMAL HIGH (ref 70–99)
Glucose-Capillary: 253 mg/dL — ABNORMAL HIGH (ref 70–99)
Glucose-Capillary: 166 mg/dL — ABNORMAL HIGH (ref 70–99)

## 2020-02-15 SURGERY — ANTERIOR CERVICAL DECOMPRESSION/DISCECTOMY FUSION 2 LEVEL/HARDWARE REMOVAL
Anesthesia: General

## 2020-02-15 MED ORDER — CHLORHEXIDINE GLUCONATE CLOTH 2 % EX PADS: 6.0000 | MEDICATED_PAD | Freq: Once | CUTANEOUS | Status: DC

## 2020-02-15 MED ORDER — FENTANYL CITRATE (PF) 100 MCG/2ML IJ SOLN
25.0000 ug | INTRAMUSCULAR | Status: DC | PRN
  Administered 2020-02-15: 50 ug via INTRAVENOUS
  Administered 2020-02-15: 25 ug via INTRAVENOUS

## 2020-02-15 MED ORDER — ACETAMINOPHEN 325 MG PO TABS
650.0000 mg | ORAL_TABLET | ORAL | Status: DC | PRN
Start: 2020-02-15 — End: 2020-02-16
  Administered 2020-02-16 (×2): 650 mg via ORAL
  Filled 2020-02-15 (×2): qty 2

## 2020-02-15 MED ORDER — TAMSULOSIN HCL 0.4 MG PO CAPS
0.4000 mg | ORAL_CAPSULE | Freq: Every day | ORAL | Status: DC
  Administered 2020-02-15 – 2020-02-16 (×2): 0.4 mg via ORAL
  Filled 2020-02-15 (×2): qty 1

## 2020-02-15 MED ORDER — PROMETHAZINE HCL 25 MG/ML IJ SOLN: 6.2500 mg | INTRAMUSCULAR | Status: DC | PRN

## 2020-02-15 MED ORDER — 0.9 % SODIUM CHLORIDE (POUR BTL) OPTIME
TOPICAL | Status: DC | PRN
  Administered 2020-02-15: 1000 mL

## 2020-02-15 MED ORDER — PHENOL 1.4 % MT LIQD: 1.0000 | OROMUCOSAL | Status: DC | PRN

## 2020-02-15 MED ORDER — AMLODIPINE BESYLATE 5 MG PO TABS
5.0000 mg | ORAL_TABLET | Freq: Every day | ORAL | Status: DC
  Administered 2020-02-16: 5 mg via ORAL
  Filled 2020-02-15: qty 1

## 2020-02-15 MED ORDER — SODIUM CHLORIDE 0.9 % IV SOLN
250.0000 mL | INTRAVENOUS | Status: DC
  Administered 2020-02-15: 250 mL via INTRAVENOUS

## 2020-02-15 MED ORDER — INSULIN ASPART 100 UNIT/ML ~~LOC~~ SOLN
0.0000 [IU] | Freq: Three times a day (TID) | SUBCUTANEOUS | Status: DC
  Administered 2020-02-15: 8 [IU] via SUBCUTANEOUS
  Administered 2020-02-16: 2 [IU] via SUBCUTANEOUS

## 2020-02-15 MED ORDER — FENTANYL CITRATE (PF) 100 MCG/2ML IJ SOLN
INTRAMUSCULAR | Status: AC
  Administered 2020-02-15: 50 ug via INTRAVENOUS
  Filled 2020-02-15: qty 2

## 2020-02-15 MED ORDER — PROPOFOL 10 MG/ML IV BOLUS
INTRAVENOUS | Status: AC
  Filled 2020-02-15: qty 40

## 2020-02-15 MED ORDER — METFORMIN HCL ER 500 MG PO TB24
1000.0000 mg | ORAL_TABLET | Freq: Every day | ORAL | Status: DC
  Administered 2020-02-15: 1000 mg via ORAL
  Filled 2020-02-15: qty 2

## 2020-02-15 MED ORDER — ONDANSETRON HCL 4 MG PO TABS: 4.0000 mg | ORAL_TABLET | Freq: Four times a day (QID) | ORAL | Status: DC | PRN

## 2020-02-15 MED ORDER — OXYCODONE HCL 5 MG PO TABS
10.0000 mg | ORAL_TABLET | ORAL | Status: DC | PRN
Start: 2020-02-15 — End: 2020-02-16
  Administered 2020-02-15 – 2020-02-16 (×6): 10 mg via ORAL
  Filled 2020-02-15 (×5): qty 2

## 2020-02-15 MED ORDER — METOPROLOL TARTRATE 50 MG PO TABS
50.0000 mg | ORAL_TABLET | Freq: Two times a day (BID) | ORAL | Status: DC
  Administered 2020-02-15 – 2020-02-16 (×2): 50 mg via ORAL
  Filled 2020-02-15 (×2): qty 2
  Filled 2020-02-15 (×2): qty 1

## 2020-02-15 MED ORDER — MIDAZOLAM HCL 5 MG/5ML IJ SOLN
INTRAMUSCULAR | Status: DC | PRN
  Administered 2020-02-15: 2 mg via INTRAVENOUS

## 2020-02-15 MED ORDER — POTASSIUM CHLORIDE IN NACL 20-0.9 MEQ/L-% IV SOLN: INTRAVENOUS | Status: DC

## 2020-02-15 MED ORDER — SUGAMMADEX SODIUM 200 MG/2ML IV SOLN
INTRAVENOUS | Status: DC | PRN
  Administered 2020-02-15: 200 mg via INTRAVENOUS

## 2020-02-15 MED ORDER — CEFAZOLIN SODIUM-DEXTROSE 2-4 GM/100ML-% IV SOLN
2.0000 g | Freq: Three times a day (TID) | INTRAVENOUS | Status: AC
  Administered 2020-02-15 – 2020-02-16 (×2): 2 g via INTRAVENOUS
  Filled 2020-02-15 (×2): qty 100

## 2020-02-15 MED ORDER — LISINOPRIL 20 MG PO TABS
40.0000 mg | ORAL_TABLET | Freq: Every day | ORAL | Status: DC
  Administered 2020-02-15 – 2020-02-16 (×2): 40 mg via ORAL
  Filled 2020-02-15 (×2): qty 2
  Filled 2020-02-15: qty 1

## 2020-02-15 MED ORDER — LACTATED RINGERS IV SOLN: INTRAVENOUS | Status: DC

## 2020-02-15 MED ORDER — PROPOFOL 10 MG/ML IV BOLUS
INTRAVENOUS | Status: DC | PRN
  Administered 2020-02-15: 200 mg via INTRAVENOUS

## 2020-02-15 MED ORDER — MIDAZOLAM HCL 2 MG/2ML IJ SOLN
INTRAMUSCULAR | Status: AC
  Filled 2020-02-15: qty 2

## 2020-02-15 MED ORDER — GABAPENTIN 300 MG PO CAPS
300.0000 mg | ORAL_CAPSULE | ORAL | Status: AC
  Administered 2020-02-15: 300 mg via ORAL
  Filled 2020-02-15: qty 1

## 2020-02-15 MED ORDER — METHOCARBAMOL 1000 MG/10ML IJ SOLN
500.0000 mg | Freq: Four times a day (QID) | INTRAVENOUS | Status: DC | PRN
  Filled 2020-02-15: qty 5

## 2020-02-15 MED ORDER — MORPHINE SULFATE (PF) 2 MG/ML IV SOLN
2.0000 mg | INTRAVENOUS | Status: DC | PRN
Start: 2020-02-15 — End: 2020-02-16

## 2020-02-15 MED ORDER — LACTATED RINGERS IV SOLN: INTRAVENOUS | Status: DC | PRN

## 2020-02-15 MED ORDER — DEXTROSE 5 % IV SOLN
INTRAVENOUS | Status: DC | PRN
  Administered 2020-02-15: 3 g via INTRAVENOUS

## 2020-02-15 MED ORDER — METHOCARBAMOL 500 MG PO TABS
ORAL_TABLET | ORAL | Status: AC
  Filled 2020-02-15: qty 1

## 2020-02-15 MED ORDER — SERTRALINE HCL 100 MG PO TABS
100.0000 mg | ORAL_TABLET | Freq: Every day | ORAL | Status: DC
  Administered 2020-02-15 – 2020-02-16 (×2): 100 mg via ORAL
  Filled 2020-02-15: qty 2
  Filled 2020-02-15 (×2): qty 1

## 2020-02-15 MED ORDER — ACETAMINOPHEN 650 MG RE SUPP: 650.0000 mg | RECTAL | Status: DC | PRN

## 2020-02-15 MED ORDER — FENTANYL CITRATE (PF) 250 MCG/5ML IJ SOLN
INTRAMUSCULAR | Status: AC
  Filled 2020-02-15: qty 5

## 2020-02-15 MED ORDER — DEXAMETHASONE SODIUM PHOSPHATE 10 MG/ML IJ SOLN
10.0000 mg | Freq: Once | INTRAMUSCULAR | Status: AC
  Administered 2020-02-15: 5 mg via INTRAVENOUS

## 2020-02-15 MED ORDER — SODIUM CHLORIDE 0.9% FLUSH
3.0000 mL | Freq: Two times a day (BID) | INTRAVENOUS | Status: DC
  Administered 2020-02-15 (×2): 3 mL via INTRAVENOUS

## 2020-02-15 MED ORDER — METHOCARBAMOL 500 MG PO TABS
500.0000 mg | ORAL_TABLET | Freq: Four times a day (QID) | ORAL | Status: DC | PRN
  Administered 2020-02-15 – 2020-02-16 (×3): 500 mg via ORAL
  Filled 2020-02-15 (×2): qty 1

## 2020-02-15 MED ORDER — CHLORHEXIDINE GLUCONATE 0.12 % MT SOLN
15.0000 mL | Freq: Once | OROMUCOSAL | Status: AC
  Administered 2020-02-15: 15 mL via OROMUCOSAL
  Filled 2020-02-15: qty 15

## 2020-02-15 MED ORDER — SENNA 8.6 MG PO TABS
1.0000 | ORAL_TABLET | Freq: Two times a day (BID) | ORAL | Status: DC
  Administered 2020-02-15 – 2020-02-16 (×2): 8.6 mg via ORAL
  Filled 2020-02-15 (×3): qty 1

## 2020-02-15 MED ORDER — GABAPENTIN 300 MG PO CAPS
600.0000 mg | ORAL_CAPSULE | Freq: Every day | ORAL | Status: DC
  Administered 2020-02-15 – 2020-02-16 (×2): 600 mg via ORAL
  Filled 2020-02-15 (×2): qty 2

## 2020-02-15 MED ORDER — THROMBIN 5000 UNITS EX SOLR
CUTANEOUS | Status: AC
  Filled 2020-02-15: qty 5000

## 2020-02-15 MED ORDER — ALLOPURINOL 300 MG PO TABS: 450.0000 mg | ORAL_TABLET | Freq: Every day | ORAL | Status: DC

## 2020-02-15 MED ORDER — FENTANYL CITRATE (PF) 250 MCG/5ML IJ SOLN
INTRAMUSCULAR | Status: DC | PRN
  Administered 2020-02-15: 50 ug via INTRAVENOUS
  Administered 2020-02-15: 150 ug via INTRAVENOUS
  Administered 2020-02-15: 50 ug via INTRAVENOUS

## 2020-02-15 MED ORDER — EPHEDRINE SULFATE 50 MG/ML IJ SOLN
INTRAMUSCULAR | Status: DC | PRN
  Administered 2020-02-15 (×3): 10 mg via INTRAVENOUS

## 2020-02-15 MED ORDER — ONDANSETRON HCL 4 MG/2ML IJ SOLN: 4.0000 mg | Freq: Four times a day (QID) | INTRAMUSCULAR | Status: DC | PRN

## 2020-02-15 MED ORDER — PHENYLEPHRINE HCL-NACL 10-0.9 MG/250ML-% IV SOLN
INTRAVENOUS | Status: DC | PRN
  Administered 2020-02-15: 35 ug/min via INTRAVENOUS

## 2020-02-15 MED ORDER — SODIUM CHLORIDE 0.9% FLUSH: 3.0000 mL | INTRAVENOUS | Status: DC | PRN

## 2020-02-15 MED ORDER — ONDANSETRON HCL 4 MG/2ML IJ SOLN
INTRAMUSCULAR | Status: DC | PRN
  Administered 2020-02-15: 4 mg via INTRAVENOUS

## 2020-02-15 MED ORDER — SUCCINYLCHOLINE CHLORIDE 200 MG/10ML IV SOSY
PREFILLED_SYRINGE | INTRAVENOUS | Status: DC | PRN
  Administered 2020-02-15: 160 mg via INTRAVENOUS

## 2020-02-15 MED ORDER — LIDOCAINE 2% (20 MG/ML) 5 ML SYRINGE
INTRAMUSCULAR | Status: DC | PRN
  Administered 2020-02-15: 100 mg via INTRAVENOUS

## 2020-02-15 MED ORDER — ROCURONIUM BROMIDE 10 MG/ML (PF) SYRINGE
PREFILLED_SYRINGE | INTRAVENOUS | Status: DC | PRN
  Administered 2020-02-15: 20 mg via INTRAVENOUS
  Administered 2020-02-15: 100 mg via INTRAVENOUS

## 2020-02-15 MED ORDER — BUPIVACAINE HCL (PF) 0.25 % IJ SOLN
INTRAMUSCULAR | Status: DC | PRN
  Administered 2020-02-15: 5 mL

## 2020-02-15 MED ORDER — FENTANYL CITRATE (PF) 100 MCG/2ML IJ SOLN
INTRAMUSCULAR | Status: AC
  Administered 2020-02-15: 25 ug via INTRAVENOUS
  Filled 2020-02-15: qty 2

## 2020-02-15 MED ORDER — ORAL CARE MOUTH RINSE: 15.0000 mL | Freq: Once | OROMUCOSAL | Status: AC

## 2020-02-15 MED ORDER — BUPIVACAINE HCL (PF) 0.25 % IJ SOLN
INTRAMUSCULAR | Status: AC
  Filled 2020-02-15: qty 30

## 2020-02-15 MED ORDER — THROMBIN 5000 UNITS EX SOLR
OROMUCOSAL | Status: DC | PRN
  Administered 2020-02-15: 5 mL via TOPICAL

## 2020-02-15 MED ORDER — OXYCODONE HCL 5 MG PO TABS
ORAL_TABLET | ORAL | Status: AC
  Filled 2020-02-15: qty 2

## 2020-02-15 MED ORDER — ACETAMINOPHEN 500 MG PO TABS
1000.0000 mg | ORAL_TABLET | ORAL | Status: AC
  Administered 2020-02-15: 1000 mg via ORAL
  Filled 2020-02-15: qty 2

## 2020-02-15 MED ORDER — MENTHOL 3 MG MT LOZG: 1.0000 | LOZENGE | OROMUCOSAL | Status: DC | PRN

## 2020-02-15 MED ORDER — GABAPENTIN 600 MG PO TABS
300.0000 mg | ORAL_TABLET | Freq: Every day | ORAL | Status: DC
  Administered 2020-02-15: 300 mg via ORAL
  Filled 2020-02-15: qty 1

## 2020-02-15 SURGICAL SUPPLY — 53 items
BAND RUBBER #18 3X1/16 STRL (MISCELLANEOUS) ×4 IMPLANT
BASKET BONE COLLECTION (BASKET) IMPLANT
BENZOIN TINCTURE PRP APPL 2/3 (GAUZE/BANDAGES/DRESSINGS) ×2 IMPLANT
BUR CARBIDE MATCH 3.0 (BURR) ×2 IMPLANT
BUR CROSS CUT FISSURE 1.2 (BURR) ×2 IMPLANT
CANISTER SUCT 3000ML PPV (MISCELLANEOUS) ×2 IMPLANT
COVER WAND RF STERILE (DRAPES) ×2 IMPLANT
DRAIN SNY 7 FPER (WOUND CARE) ×2 IMPLANT
DRAPE C-ARM 42X72 X-RAY (DRAPES) ×4 IMPLANT
DRAPE LAPAROTOMY 100X72 PEDS (DRAPES) ×2 IMPLANT
DRAPE MICROSCOPE LEICA (MISCELLANEOUS) ×2 IMPLANT
DRSG OPSITE POSTOP 4X6 (GAUZE/BANDAGES/DRESSINGS) ×2 IMPLANT
DURAPREP 6ML APPLICATOR 50/CS (WOUND CARE) ×2 IMPLANT
ELECT COATED BLADE 2.86 ST (ELECTRODE) ×2 IMPLANT
ELECT REM PT RETURN 9FT ADLT (ELECTROSURGICAL) ×2
ELECTRODE REM PT RTRN 9FT ADLT (ELECTROSURGICAL) ×1 IMPLANT
EVACUATOR SILICONE 100CC (DRAIN) ×2 IMPLANT
GAUZE 4X4 16PLY RFD (DISPOSABLE) IMPLANT
GLOVE BIO SURGEON STRL SZ7 (GLOVE) ×4 IMPLANT
GLOVE BIO SURGEON STRL SZ8 (GLOVE) ×2 IMPLANT
GLOVE BIOGEL PI IND STRL 7.0 (GLOVE) ×2 IMPLANT
GLOVE BIOGEL PI IND STRL 7.5 (GLOVE) ×1 IMPLANT
GLOVE BIOGEL PI INDICATOR 7.0 (GLOVE) ×2
GLOVE BIOGEL PI INDICATOR 7.5 (GLOVE) ×1
GLOVE SURG SS PI 7.0 STRL IVOR (GLOVE) ×2 IMPLANT
GOWN STRL REUS W/ TWL LRG LVL3 (GOWN DISPOSABLE) ×2 IMPLANT
GOWN STRL REUS W/ TWL XL LVL3 (GOWN DISPOSABLE) ×1 IMPLANT
GOWN STRL REUS W/TWL 2XL LVL3 (GOWN DISPOSABLE) ×2 IMPLANT
GOWN STRL REUS W/TWL LRG LVL3 (GOWN DISPOSABLE) ×4
GOWN STRL REUS W/TWL XL LVL3 (GOWN DISPOSABLE) ×2
HEMOSTAT POWDER KIT SURGIFOAM (HEMOSTASIS) ×2 IMPLANT
KIT BASIN OR (CUSTOM PROCEDURE TRAY) ×2 IMPLANT
KIT TURNOVER KIT B (KITS) ×2 IMPLANT
NEEDLE HYPO 25X1 1.5 SAFETY (NEEDLE) ×2 IMPLANT
NEEDLE SPNL 20GX3.5 QUINCKE YW (NEEDLE) ×2 IMPLANT
NS IRRIG 1000ML POUR BTL (IV SOLUTION) ×2 IMPLANT
PACK LAMINECTOMY NEURO (CUSTOM PROCEDURE TRAY) ×2 IMPLANT
PAD ARMBOARD 7.5X6 YLW CONV (MISCELLANEOUS) ×2 IMPLANT
PIN DISTRACTION 14MM (PIN) IMPLANT
PLATE 34MM (Plate) ×2 IMPLANT
PUTTY BONE 1CC ×2 IMPLANT
RASP 3.0MM (RASP) ×2 IMPLANT
SCREW CANN 4X16 SS S/DRILL (Screw) ×12 IMPLANT
SPACER PTI-C 7X16X14 IDENTI (Spacer) ×2 IMPLANT
SPACER PTI-C 8X16X14 7DEG (Spacer) ×2 IMPLANT
SPONGE INTESTINAL PEANUT (DISPOSABLE) ×2 IMPLANT
SPONGE SURGIFOAM ABS GEL SZ50 (HEMOSTASIS) IMPLANT
STRIP CLOSURE SKIN 1/2X4 (GAUZE/BANDAGES/DRESSINGS) ×2 IMPLANT
SUT VIC AB 3-0 SH 8-18 (SUTURE) ×4 IMPLANT
SUT VICRYL 4-0 PS2 18IN ABS (SUTURE) IMPLANT
TOWEL GREEN STERILE (TOWEL DISPOSABLE) ×2 IMPLANT
TOWEL GREEN STERILE FF (TOWEL DISPOSABLE) ×2 IMPLANT
WATER STERILE IRR 1000ML POUR (IV SOLUTION) ×2 IMPLANT

## 2020-02-15 NOTE — Evaluation (Signed)
Physical Therapy Evaluation Patient Details Name: [name]. MRN: [id number] DOB: December 15, 1960 Today's Date: 02/15/2020   History of Present Illness  Patient is a 59 y/o male with history of neck pain who is s/p ACDF C3-5 on 12/13. Patient with PMH of anxiety, depression, DM, GERD, gout, HTN, and sleep apnea.  Clinical Impression  PTA, patient used [facility] for long distance ambulation and wife assisted with ADLs due to limited shoulder ROM and decreased strength (patient states he will have a total shoulder replacement after healing from neck surgery). Patient overall supervision for all mobility. Patient ambulated 200' with IV pole for 125' and no AD for 75'. Encouraged patient to ambulate in the hallway multiple times a day to build strength and endurance. No further skilled PT needs at this time in the acute setting. No PT follow up recommended. PT will sign off.     Follow Up Recommendations No PT follow up    Equipment Recommendations  None recommended by PT    Recommendations for Other Services       Precautions / Restrictions Precautions Precautions: Cervical Precaution Booklet Issued: Yes (comment) Required Braces or Orthoses:  (patient requested one for comfort, MD did not order) Restrictions Weight Bearing Restrictions: No      Mobility  Bed Mobility Overal bed mobility: Needs Assistance Bed Mobility: Supine to Sit     Supine to sit: Supervision          Transfers Overall transfer level: Needs assistance Equipment used: None Transfers: Sit to/from Stand Sit to Stand: Supervision            Ambulation/Gait Ambulation/Gait assistance: Supervision Gait Distance (Feet): 200 Feet Assistive device: IV Pole;None Gait Pattern/deviations: Step-through pattern;Decreased stride length;Wide base of support Gait velocity: decreased   General Gait Details: 57' with IV pole and 26' with no AD  Stairs            Wheelchair Mobility    Modified  Rankin (Stroke Patients Only)       Balance Overall balance assessment: No apparent balance deficits (not formally assessed)                                           Pertinent Vitals/Pain Pain Assessment: Faces Faces Pain Scale: Hurts even more Pain Location: neck Pain Descriptors / Indicators: Grimacing;Guarding Pain Intervention(s): Monitored during session    Home Living Family/patient expects to be discharged to:: Private residence Living Arrangements: Spouse/significant other Available Help at Discharge: Family;Available 24 hours/day Type of Home: House Home Access: Level entry     Home Layout: Two level (lives on main level) Home Equipment: Cane - single point      Prior Function Level of Independence: Needs assistance   Gait / Transfers Assistance Needed: used SPC for long distances  ADL's / Homemaking Assistance Needed: wife assists with ADLs        Hand Dominance        Extremity/Trunk Assessment   Upper Extremity Assessment Upper Extremity Assessment: Defer to OT evaluation    Lower Extremity Assessment Lower Extremity Assessment: Generalized weakness       Communication   Communication: No difficulties  Cognition Arousal/Alertness: Awake/alert Behavior During Therapy: WFL for tasks assessed/performed Overall Cognitive Status: Within Functional Limits for tasks assessed  General Comments General comments (skin integrity, edema, etc.): father in law present throughout sessin    Exercises     Assessment/Plan    PT Assessment Patent does not need any further PT services  PT Problem List Decreased strength;Decreased activity tolerance;Decreased mobility       PT Treatment Interventions      PT Goals (Current goals can be found in the Care Plan section)  Acute Rehab PT Goals Patient Stated Goal: to go home PT Goal Formulation: With patient    Frequency      Barriers to discharge        Co-evaluation               AM-PAC PT "6 Clicks" Mobility  Outcome Measure Help needed turning from your back to your side while in a flat bed without using bedrails?: A Little Help needed moving from lying on your back to sitting on the side of a flat bed without using bedrails?: A Little Help needed moving to and from a bed to a chair (including a wheelchair)?: A Little Help needed standing up from a chair using your arms (e.g., wheelchair or bedside chair)?: A Little Help needed to walk in hospital room?: A Little Help needed climbing 3-5 steps with a railing? : A Little 6 Click Score: 18    End of Session Equipment Utilized During Treatment: Gait belt Activity Tolerance: Patient tolerated treatment well Patient left: in bed;with call bell/phone within reach;with family/visitor present Nurse Communication: Mobility status      Time: 5449-2010 PT Time Calculation (min) (ACUTE ONLY): 40 min   Charges:   PT Evaluation $PT Eval Low Complexity: 1 Low PT Treatments $Therapeutic Activity: 8-22 mins        Gregor Hams, PT, DPT Acute Rehabilitation Services Pager [phone] Office [phone]   Zannie Kehr Allred 02/15/2020, 3:56 PM

## 2020-02-15 NOTE — Evaluation (Signed)
Occupational Therapy Evaluation Patient Details Name: [name]. MRN: [id number] DOB: [birth year] Today's Date: 02/15/2020    History of Present Illness Patient is a 59 y/o male with history of neck pain who is s/p ACDF C3-5 on 12/13. Patient with PMH of anxiety, depression, DM, GERD, gout, HTN, and sleep apnea.   Clinical Impression   Pt PTA: Pt living at home with spouse who assisted pt with ADL/iADLs. Pt's spouse has recently been diagnosed with cancer and may not be able to assist as readily as usual. Pt currently, requires minA for UB ADL and modA for LB ADL. Pt education provided for adaptive equipment for LB ADL and pt efficient. Pt supervisionA level for OOB mobility and transfers. Pt with limited BUE shoulder movements due to previous injuries. Pt could benefit from continued OT skilled services. OT following acutely. Pt would benefit from follow-up OT for assist with ADL and BUEs    Follow Up Recommendations  Home health OT;Outpatient OT;Supervision/Assistance - 24 hour (with home health aid)    Equipment Recommendations  3 in 1 bedside commode    Recommendations for Other Services       Precautions / Restrictions Precautions Precautions: Cervical Precaution Booklet Issued: Yes (comment) Precaution Comments: education provided for cervical precautions Required Braces or Orthoses: Cervical Brace Cervical Brace: Soft collar;For comfort Restrictions Weight Bearing Restrictions: No      Mobility Bed Mobility Overal bed mobility: Needs Assistance Bed Mobility: Supine to Sit     Supine to sit: Supervision          Transfers Overall transfer level: Needs assistance Equipment used: None Transfers: Sit to/from Stand Sit to Stand: Supervision              Balance Overall balance assessment: No apparent balance deficits (not formally assessed)                                         ADL either performed or assessed with clinical  judgement   ADL Overall ADL's : At baseline (Pt minA for UB ADL and modA for LB ADL. Pt education provided for AE for LB ADL. Pt exhibits ability to reach behind for peri care. Pt reports that his wife is going to be less able to help him. Pt reported that he wants to buy the hip kit online.)                                             Vision Baseline Vision/History: No visual deficits Vision Assessment?: No apparent visual deficits     Perception     Praxis      Pertinent Vitals/Pain Pain Assessment: Faces Faces Pain Scale: Hurts even more Pain Location: neck Pain Descriptors / Indicators: Grimacing;Guarding Pain Intervention(s): Monitored during session;Repositioned     Hand Dominance Right   Extremity/Trunk Assessment Upper Extremity Assessment Upper Extremity Assessment: LUE deficits/detail;RUE deficits/detail;Generalized weakness RUE Deficits / Details: shoulder flexion to ~65, elbow through digits, WFLs LUE Deficits / Details: shoulder flexion to ~65, elbow through digits, WFLs   Lower Extremity Assessment Lower Extremity Assessment: Generalized weakness;Defer to PT evaluation   Cervical / Trunk Assessment Cervical / Trunk Assessment: Other exceptions (s/p neck sx)   Communication Communication Communication: No difficulties   Cognition Arousal/Alertness: Awake/alert Behavior  During Therapy: WFL for tasks assessed/performed Overall Cognitive Status: Within Functional Limits for tasks assessed                                     General Comments       Exercises     Shoulder Instructions      Home Living Family/patient expects to be discharged to:: Private residence Living Arrangements: Spouse/significant other Available Help at Discharge: Family;Available 24 hours/day Type of Home: House Home Access: Level entry     Home Layout: Two level (lives on main level)     Bathroom Shower/Tub: Engineer, site: Standard     Home Equipment: Cane - single point          Prior Functioning/Environment Level of Independence: Needs assistance  Gait / Transfers Assistance Needed: used SPC for long distances ADL's / Homemaking Assistance Needed: wife assists with ADLs            OT Problem List: Decreased strength;Decreased activity tolerance;Decreased safety awareness;Pain;Impaired UE functional use;Increased edema;Decreased knowledge of precautions;Impaired balance (sitting and/or standing);Decreased range of motion      OT Treatment/Interventions: Self-care/ADL training;Therapeutic exercise;Energy conservation;DME and/or AE instruction;Therapeutic activities;Patient/family education;Balance training    OT Goals(Current goals can be found in the care plan section) Acute Rehab OT Goals Patient Stated Goal: to go home OT Goal Formulation: With patient Time For Goal Achievement: 02/29/20 Potential to Achieve Goals: Good ADL Goals Pt Will Perform Lower Body Dressing: with supervision;with adaptive equipment;sit to/from stand Pt Will Transfer to Toilet: with supervision  OT Frequency: Min 2X/week   Barriers to D/C: Decreased caregiver support  spouse has been recently diagnosed with cancer       Co-evaluation              AM-PAC OT "6 Clicks" Daily Activity     Outcome Measure Help from another person eating meals?: None Help from another person taking care of personal grooming?: None Help from another person toileting, which includes using toliet, bedpan, or urinal?: None Help from another person bathing (including washing, rinsing, drying)?: A Little Help from another person to put on and taking off regular upper body clothing?: A Little Help from another person to put on and taking off regular lower body clothing?: A Little 6 Click Score: 21   End of Session Equipment Utilized During Treatment: Cervical collar Nurse Communication: Mobility status  Activity  Tolerance: Patient tolerated treatment well;Patient limited by pain Patient left: in chair;with call bell/phone within reach  OT Visit Diagnosis: Muscle weakness (generalized) (M62.81);Pain                Time: 1700-1725 OT Time Calculation (min): 25 min Charges:  OT General Charges $OT Visit: 1 Visit OT Evaluation $OT Eval Moderate Complexity: 1 Mod OT Treatments $Self Care/Home Management : 8-22 mins  Jefferey Pica, OTR/L Acute Rehabilitation Services Pager: [phone] Office: [place] 02/15/2020, 9:14 PM

## 2020-02-15 NOTE — Op Note (Signed)
02/15/2020  10:30 AM  PATIENT:  [name].  59 y.o. male  PRE-OPERATIVE DIAGNOSIS: Cervical spinal stenosis C3-4 and C4-5 with neck pain and shoulder pain and cord compression with myelopathy  POST-OPERATIVE DIAGNOSIS:  same  PROCEDURE:  1. Decompressive anterior cervical discectomy C3-4 C4-5, 2. Anterior cervical arthrodesis C3-4 C4-5 utilizing a porous titanium interbody cage packed with locally harvested morcellized autologous bone graft, 3. Anterior cervical plating C3-C5 inclusive utilizing a Alphatec plate, 4.  Removal of the top part of the plate B5-1  SURGEON:  Marikay Alar, MD  ASSISTANTS: Verlin Dike, FNP  ANESTHESIA:   General  EBL: 50 ml  Total I/O In: 850 [I.V.:800; IV Piggyback:50] Out: 50 [Blood:50]  BLOOD ADMINISTERED: none  DRAINS: none  SPECIMEN:  none  INDICATION FOR PROCEDURE: This patient presented with neck and shoulder pain. Imaging showed severe cervical spinal stenosis C3-4 and C4-5 with signal change in the cord at C4-5.  He had had a previous ACDF at C5-6 and C6-7 the patient tried conservative measures without relief. Pain was debilitating. Recommended ACDF with plating. Patient understood the risks, benefits, and alternatives and potential outcomes and wished to proceed.  PROCEDURE DETAILS: Patient was brought to the operating room placed under general endotracheal anesthesia. Patient was placed in the supine position on the operating room table. The neck was prepped with Duraprep and draped in a sterile fashion.   Three cc of local anesthesia was injected and a transverse incision was made on the [name] side of the neck.  Dissection was carried down thru the subcutaneous tissue and the platysma was  elevated, opened, and undermined with Metzenbaum scissors.  Dissection was then carried out thru an avascular plane leaving the sternocleidomastoid carotid artery and jugular vein laterally and the trachea and esophagus medially with the assistance  of my nurse practitioner. The ventral aspect of the vertebral column was identified and a localizing x-ray was taken. The C4-5 level was identified and all in the room agreed with the level. The longus colli muscles were then elevated and the retractor was placed with the assistance of my nurse practitioner to expose C3-4 and C4-5. The annulus was incised and the disc space entered.  We did use a drill with a metal cutting bur and cut across the plate below the C5 screws and remove the top part of the plate as well as the screws out of C5.  Discectomy C3-4 and C4-5 was performed with micro-curettes and pituitary rongeurs. I then used the high-speed drill to drill the endplates down to the level of the posterior longitudinal ligament. The drill shavings were saved in a mucous trap for later arthrodesis. The operating microscope was draped and brought into the field provided additional magnification, illumination and visualization. Discectomy was continued posteriorly thru the disc space. Posterior longitudinal ligament was opened with a nerve hook, and then removed along with disc herniation and osteophytes, decompressing the spinal canal and thecal sac. We then continued to remove osteophytic overgrowth and disc material decompressing the neural foramina and exiting nerve roots bilaterally.  The posterior longitudinal ligament was starting to calcified and was stuck to the dura in the midline at both levels.  As seen on the MRI there was a midline osteophyte at C3-4 that was compressive.  This was removed.  I tried to work the ligament away from the dura as best I could undercut the vertebral bodies as best I could.  The scope was angled up and down to help decompress  and undercut the vertebral bodies. Once the decompression was completed we could pass a nerve hook circumferentially to assure adequate decompression in the midline and in the neural foramina. So by both visualization and palpation we felt we had an  adequate decompression of the neural elements. We then measured the height of the intravertebral disc space and selected a 8 millimeter PTI interbody cage packed with autograft and morselized allograft for C4-5 and a 7 mm graft for C3-4. It was then gently positioned in the intravertebral disc space(s) and countersunk. I then used a 34 mm Alphatec plate and placed variable angle 14 mm screws into the vertebral bodies of each level and locked them into position. The wound was irrigated with bacitracin solution, checked for hemostasis which was established and confirmed. Once meticulous hemostasis was achieved, we then proceeded with closure with the assistance of my nurse practitioner. The platysma was closed with interrupted 3-0 undyed Vicryl suture, the subcuticular layer was closed with interrupted 3-0 undyed Vicryl suture. The skin edges were approximated with steristrips. The drapes were removed. A sterile dressing was applied. The patient was then awakened from general anesthesia and transferred to the recovery room in stable condition. At the end of the procedure all sponge, needle and instrument counts were correct.   PLAN OF CARE: Admit for overnight observation  PATIENT DISPOSITION:  PACU - hemodynamically stable.   Delay start of Pharmacological VTE agent (>24hrs) due to surgical blood loss or risk of bleeding:  yes

## 2020-02-15 NOTE — Progress Notes (Signed)
Looks great post-op, walked hall with PT, neck sore, no ar pain or NTW

## 2020-02-15 NOTE — Anesthesia Procedure Notes (Signed)
Procedure Name: Intubation Date/Time: 02/15/2020 7:43 AM Performed by: Glynda Jaeger, CRNA Pre-anesthesia Checklist: Patient identified, Patient being monitored, Timeout performed, Emergency Drugs available and Suction available Patient Re-evaluated:Patient Re-evaluated prior to induction Oxygen Delivery Method: Circle System Utilized Preoxygenation: Pre-oxygenation with 100% oxygen Induction Type: IV induction Ventilation: Mask ventilation without difficulty Laryngoscope Size: Mac and 4 Grade View: Grade I Tube type: Oral Tube size: 7.5 mm Number of attempts: 1 Airway Equipment and Method: Stylet Placement Confirmation: ETT inserted through vocal cords under direct vision,  positive ETCO2 and breath sounds checked- equal and bilateral Secured at: 21 cm Tube secured with: Tape Dental Injury: Teeth and Oropharynx as per pre-operative assessment

## 2020-02-15 NOTE — Progress Notes (Signed)
Orthopedic Tech Progress Note Patient Details:  [name]. [birth year] [id number]  Ortho Devices Type of Ortho Device: Soft collar Ortho Device/Splint Location: NECK Ortho Device/Splint Interventions: Ordered,Application,Adjustment   Post Interventions Patient Tolerated: Well Instructions Provided: Care of device   Donald Pore 02/15/2020, 3:54 PM

## 2020-02-15 NOTE — Transfer of Care (Signed)
Immediate Anesthesia Transfer of Care Note  Patient: [name].  Procedure(s) Performed: Anterior Cervical Discectomy and Fusion Cervical Three-Cervical Four/Cervical Four-Cervical Five with Hardware Removal (N/A )  Patient Location: PACU  Anesthesia Type:General  Level of Consciousness: awake, alert , patient cooperative and responds to stimulation  Airway & Oxygen Therapy: Patient Spontanous Breathing and Patient connected to face mask oxygen  Post-op Assessment: Report given to RN and Post -op Vital signs reviewed and stable  Post vital signs: Reviewed and stable  Last Vitals:  Vitals Value Taken Time  BP 157/92 02/15/20 1040  Temp    Pulse 82 02/15/20 1041  Resp 16 02/15/20 1041  SpO2 97 % 02/15/20 1041  Vitals shown include unvalidated device data.  Last Pain:  Vitals:   02/15/20 0637  TempSrc:   PainSc: 2       Patients Stated Pain Goal: 6 (02/15/20 4098)  Complications: No complications documented.

## 2020-02-15 NOTE — Anesthesia Postprocedure Evaluation (Signed)
Anesthesia Post Note  Patient: [name].  Procedure(s) Performed: Anterior Cervical Discectomy and Fusion Cervical Three-Cervical Four/Cervical Four-Cervical Five with Hardware Removal (N/A )     Patient location during evaluation: PACU Anesthesia Type: General Level of consciousness: sedated Pain management: pain level controlled Vital Signs Assessment: post-procedure vital signs reviewed and stable Respiratory status: spontaneous breathing and respiratory function stable Cardiovascular status: stable Postop Assessment: no apparent nausea or vomiting Anesthetic complications: no   No complications documented.  Last Vitals:  Vitals:   02/15/20 1234 02/15/20 1258  BP:  (!) 148/86  Pulse:  77  Resp:  17  Temp: 36.4 C 36.6 C  SpO2:  95%    Last Pain:  Vitals:   02/15/20 1258  TempSrc: Oral  PainSc:       LLE Sensation: Full sensation (02/15/20 1308)   RLE Sensation: Full sensation (02/15/20 1308)      Brittannie Tawney DANIEL

## 2020-02-15 NOTE — H&P (Signed)
Subjective:   [name] is a 59 y.o. male admitted for neck pain. The [name] first presented to me with complaints of neck pain and arm pain. Onset of symptoms was several months ago. The pain is described as aching and occurs all day. The pain is rated severe, and is located in the neck and radiates to the shoulder. The symptoms have been progressive. Symptoms are exacerbated by extending head backwards, and are relieved by none.  Previous work up includes MRI of cervical spine, results: spinal stenosis.  Past Medical History:  Diagnosis Date  . Anxiety   . Bladder stone   . Depression   . Diabetes mellitus without complication (HCC)   . GERD (gastroesophageal reflux disease)   . Gout   . History of kidney stones   . Hypertension   . Sleep apnea     Past Surgical History:  Procedure Laterality Date  . BICEPS TENDON REPAIR Left   . CERVICAL FUSION    . HERNIA REPAIR Right    inguinal  . LITHOTRIPSY    . SHOULDER ARTHROSCOPY WITH ROTATOR CUFF REPAIR AND SUBACROMIAL DECOMPRESSION Left 07/29/2012   Procedure: LEFT SHOULDER ARTHROSCOPY WITH SUBACROMIAL DECOMPRESSION, DISTAL CLAVICLE RESECTION/ROTATOR CUFF REPAIR ;  Surgeon: Senaida Lange, MD;  Location: [facility] OR;  Service: Orthopedics;  Laterality: Left;  . SHOULDER SURGERY    . UMBILICAL HERNIA REPAIR      Allergies  Allergen Reactions  . Kiwi Extract Swelling    Tongue swelling  . Strawberry Extract Swelling    Tongue swelling    Social History   Tobacco Use  . Smoking status: Never Smoker  . Smokeless tobacco: Current User    Types: Snuff  Substance Use Topics  . Alcohol use: Yes    Alcohol/week: 3.0 - 4.0 standard drinks    Types: 2 - 3 Standard drinks or equivalent, 1 Glasses of wine per week    Family History  Problem Relation Age of Onset  . Suicidality Mother   . Lung disease Father    Prior to Admission medications   Medication Sig Start Date End Date Taking? Authorizing Provider  acetaminophen (TYLENOL) 650 MG  CR tablet Take 650 mg by mouth every 8 (eight) hours as needed for pain.   Yes [provider]  allopurinol (ZYLOPRIM) 300 MG tablet Take 1 tablet (300 mg total) by mouth daily. [name] taking differently: Take 450 mg by mouth daily. 07/01/18  Yes Jacquelin Hawking, PA-C  ALPRAZolam Prudy Feeler) 0.5 MG tablet Take 0.5 mg by mouth 3 (three) times daily as needed for anxiety.   Yes [provider]  amLODipine (NORVASC) 5 MG tablet Take 5 mg by mouth daily.   Yes [provider]  atorvastatin (LIPITOR) 20 MG tablet Take 1 tablet (20 mg total) by mouth daily. 07/01/18  Yes Jacquelin Hawking, PA-C  gabapentin (NEURONTIN) 300 MG capsule Take 300-600 mg by mouth See admin instructions. Take 600 mg in the morning and 300 mg at night   Yes [provider]  lisinopril (ZESTRIL) 40 MG tablet Take 40 mg by mouth daily.   Yes [provider]  Menthol, Topical Analgesic, (BIOFREEZE EX) Apply 1 application topically daily as needed (pain).   Yes [provider]  metFORMIN (GLUCOPHAGE-XR) 500 MG 24 hr tablet Take 1,000 mg by mouth at bedtime. 12/07/19  Yes [provider]  metoprolol tartrate (LOPRESSOR) 50 MG tablet Take 1 tablet (50 mg total) by mouth 2 (two) times daily. 07/01/18  Yes Jacquelin Hawking,  PA-C  Multiple Vitamin (MULTIVITAMIN WITH MINERALS) TABS tablet Take 1 tablet by mouth daily.   Yes [provider]  oxyCODONE-acetaminophen (PERCOCET/ROXICET) 5-325 MG tablet Take 1 tablet by mouth every 6 (six) hours as needed for pain. 02/03/20  Yes [provider]  Saw Palmetto, Serenoa repens, (SAW PALMETTO PO) Take 2 tablets by mouth daily.   Yes [provider]  sertraline (ZOLOFT) 100 MG tablet Take 100 mg by mouth daily.    Yes [provider]  tamsulosin (FLOMAX) 0.4 MG CAPS capsule Take 1 capsule (0.4 mg total) by mouth daily. 07/01/18  Yes Jacquelin Hawking, PA-C  HYDROcodone-acetaminophen (NORCO/VICODIN) 5-325 MG  tablet One tablet every six hours for pain.  Limit 7 days. [name] not taking: Reported on 02/08/2020 11/10/19   Darreld Mclean, MD  lisinopril (ZESTRIL) 20 MG tablet Take 1 tablet (20 mg total) by mouth daily. [name] not taking: Reported on 02/08/2020 07/01/18   Jacquelin Hawking, PA-C  naproxen (NAPROSYN) 500 MG tablet Take 1 tablet (500 mg total) by mouth 2 (two) times daily with a meal. [name] not taking: Reported on 02/08/2020 09/17/19   Darreld Mclean, MD     Review of Systems  Positive ROS: neg  All other systems have been reviewed and were otherwise negative with the exception of those mentioned in the HPI and as above.  Objective: Vital signs in last 24 hours: Temp:  [98.4 F (36.9 C)] 98.4 F (36.9 C) (12/13 0601) Pulse Rate:  [59] 59 (12/13 0601) Resp:  [17] 17 (12/13 0601) BP: (124)/(68) 124/68 (12/13 0601) SpO2:  [95 %] 95 % (12/13 0601) Weight:  [122.5 kg] 122.5 kg (12/13 0601)  General Appearance: Alert, cooperative, no distress, appears stated age Head: Normocephalic, without obvious abnormality, atraumatic Eyes: PERRL, conjunctiva/corneas clear, EOM's intact      Neck: Supple, symmetrical, trachea midline, Back: Symmetric, no curvature, ROM normal, no CVA tenderness Lungs:  respirations unlabored Heart: Regular rate and rhythm Abdomen: Soft, non-tender Extremities: Extremities normal, atraumatic, no cyanosis or edema Pulses: 2+ and symmetric all extremities Skin: Skin color, texture, turgor normal, no rashes or lesions  NEUROLOGIC:  Mental status: Alert and oriented x4, no aphasia, good attention span, fund of knowledge and memory  Motor Exam - grossly normal Sensory Exam - grossly normal Reflexes: trace Coordination - grossly normal Gait - grossly normal Balance - grossly normal Cranial Nerves: I: smell Not tested  II: visual acuity  OS: nl    OD: nl  II: visual fields Full to confrontation  II: pupils Equal, round, reactive to light  III,VII: ptosis  None  III,IV,VI: extraocular muscles  Full ROM  V: mastication Normal  V: facial light touch sensation  Normal  V,VII: corneal reflex  Present  VII: facial muscle function - upper  Normal  VII: facial muscle function - lower Normal  VIII: hearing Not tested  IX: soft palate elevation  Normal  IX,X: gag reflex Present  XI: trapezius strength  5/5  XI: sternocleidomastoid strength 5/5  XI: neck flexion strength  5/5  XII: tongue strength  Normal    Data Review Lab Results  Component Value Date   WBC 8.7 02/11/2020   HGB 14.5 02/11/2020   HCT 43.8 02/11/2020   MCV 91.4 02/11/2020   PLT 188 02/11/2020   Lab Results  Component Value Date   NA 142 02/11/2020   K 4.2 02/11/2020   CL 105 02/11/2020   CO2 27 02/11/2020   BUN 11 02/11/2020   CREATININE 0.95  02/11/2020   GLUCOSE 107 (H) 02/11/2020   Lab Results  Component Value Date   INR 1.0 02/11/2020    Assessment:   Cervical neck pain with herniated nucleus pulposus/ spondylosis/ stenosis at C3-5. Estimated body mass index is 42.29 kg/m as calculated from the following:   Height as of this encounter: 5\' 7"  (1.702 m).   Weight as of this encounter: 122.5 kg.  [name] has failed conservative therapy. Planned surgery : ACDF C3-4 C4-5  Plan:   I explained the condition and procedure to the [name] and answered any questions.  [name] wishes to proceed with procedure as planned. Understands risks/ benefits/ and expected or typical outcomes.  02/15/2020 7:17 AM

## 2020-02-16 DIAGNOSIS — M4712 Other spondylosis with myelopathy, cervical region: Secondary | ICD-10-CM | POA: Diagnosis not present

## 2020-02-16 LAB — GLUCOSE, CAPILLARY: Glucose-Capillary: 135 mg/dL — ABNORMAL HIGH (ref 70–99)

## 2020-02-16 MED ORDER — OXYCODONE-ACETAMINOPHEN 5-325 MG PO TABS: 1.0000 | ORAL_TABLET | Freq: Four times a day (QID) | ORAL | 0 refills | Status: DC | PRN

## 2020-02-16 MED ORDER — METHOCARBAMOL 500 MG PO TABS: 500.0000 mg | ORAL_TABLET | Freq: Four times a day (QID) | ORAL | 0 refills | Status: DC | PRN

## 2020-02-16 NOTE — Plan of Care (Signed)
Patient adequately improving and pain level improved with pain medication regimen

## 2020-02-16 NOTE — Plan of Care (Signed)
Patient alert and oriented, mae's well, voiding adequate amount of urine, swallowing without difficulty, no c/o pain at time of discharge. Patient discharged home with family. Script and discharged instructions given to patient. Patient and family stated understanding of instructions given. Patient has an appointment with Dr. Jones °

## 2020-02-16 NOTE — Discharge Summary (Signed)
Physician Discharge Summary  Patient ID: [name]. MRN: [id number] DOB/AGE: 10/06/1960 59 y.o.  Admit date: 02/15/2020 Discharge date: 02/16/2020  Admission Diagnoses:  Cervical spinal stenosis C3-4 and C4-5 with neck pain and shoulder pain and cord compression with myelopathy     Discharge Diagnoses: same   Discharged Condition: good  Hospital Course: The patient was admitted on 02/15/2020 and taken to the operating room where the patient underwent acdf C3-4 C4-5. The patient tolerated the procedure well and was taken to the recovery room and then to the floor in stable condition. The hospital course was routine. There were no complications. The wound remained clean dry and intact. Pt had appropriate neck soreness. No complaints of arm pain or new N/T/W. The patient remained afebrile with stable vital signs, and tolerated a regular diet. The patient continued to increase activities, and pain was well controlled with oral pain medications.   Consults: None  Significant Diagnostic Studies:  Results for orders placed or performed during the hospital encounter of 02/15/20  Glucose, capillary  Result Value Ref Range   Glucose-Capillary 132 (H) 70 - 99 mg/dL  Glucose, capillary  Result Value Ref Range   Glucose-Capillary 152 (H) 70 - 99 mg/dL  Glucose, capillary  Result Value Ref Range   Glucose-Capillary 253 (H) 70 - 99 mg/dL  Glucose, capillary  Result Value Ref Range   Glucose-Capillary 166 (H) 70 - 99 mg/dL   Comment 1 Notify RN    Comment 2 Document in Chart   Glucose, capillary  Result Value Ref Range   Glucose-Capillary 135 (H) 70 - 99 mg/dL   Comment 1 Notify RN    Comment 2 Document in Chart     Chest 2 View  Result Date: 02/11/2020 CLINICAL DATA:  59 year old male under preoperative evaluation for ACDF at C3-C5. EXAM: CHEST - 2 VIEW COMPARISON:  Chest x-ray 07/29/2012. FINDINGS: Lung volumes are normal. Mild chronic scarring in the lingula, similar to  prior studies. No consolidative airspace disease. No pleural effusions. No pneumothorax. No pulmonary nodule or mass noted. Pulmonary vasculature and the cardiomediastinal silhouette are within normal limits. Orthopedic fixation hardware in the lower cervical spine. IMPRESSION: No radiographic evidence of acute cardiopulmonary disease. Electronically Signed   By: Trudie Reed M.D.   On: 02/11/2020 16:49   DG Cervical Spine 2-3 Views  Result Date: 02/15/2020 CLINICAL DATA:  ACDF EXAM: CERVICAL SPINE - 2-3 VIEW; DG C-ARM 1-60 MIN FLUOROSCOPY TIME:  Time: 12.3 seconds Dose: 4.22 mGy COMPARISON:  01/06/2020 FINDINGS: On the first image a surgical probe is identified with tip in the C3-4 disc space. On the second image anterior sideplate and screw device is noted at C3 through C5. Interbody spacers are identified within the C3-4 and C4-5 disc space. IMPRESSION: Status post anterior cervical fusion from C3 through C5. Electronically Signed   By: Signa Kell M.D.   On: 02/15/2020 11:21   DG C-Arm 1-60 Min  Result Date: 02/15/2020 CLINICAL DATA:  ACDF EXAM: CERVICAL SPINE - 2-3 VIEW; DG C-ARM 1-60 MIN FLUOROSCOPY TIME:  Time: 12.3 seconds Dose: 4.22 mGy COMPARISON:  01/06/2020 FINDINGS: On the first image a surgical probe is identified with tip in the C3-4 disc space. On the second image anterior sideplate and screw device is noted at C3 through C5. Interbody spacers are identified within the C3-4 and C4-5 disc space. IMPRESSION: Status post anterior cervical fusion from C3 through C5. Electronically Signed   By: Signa Kell M.D.   On:  02/15/2020 11:21    Antibiotics:  Anti-infectives (From admission, onward)   Start     Dose/Rate Route Frequency Ordered Stop   02/15/20 1600  ceFAZolin (ANCEF) IVPB 2g/100 mL premix        2 g 200 mL/hr over 30 Minutes Intravenous Every 8 hours 02/15/20 1249 02/16/20 0034   02/15/20 0715  ceFAZolin (ANCEF) 3 g in dextrose 5 % 50 mL IVPB  Status:  Discontinued         3 g 100 mL/hr over 30 Minutes Intravenous On call to O.R. 02/12/20 1400 02/15/20 1246      Discharge Exam: Blood pressure 107/69, pulse 66, temperature 97.8 F (36.6 C), temperature source Oral, resp. rate 18, height 5\' 7"  (1.702 m), weight 122.5 kg, SpO2 95 %. Neurologic: Grossly normal Ambulating and voiding well, incision cdi  Discharge Medications:   Allergies as of 02/16/2020      Reactions   Kiwi Extract Swelling   Tongue swelling   Strawberry Extract Swelling   Tongue swelling      Medication List    TAKE these medications   acetaminophen 650 MG CR tablet Commonly known as: TYLENOL Take 650 mg by mouth every 8 (eight) hours as needed for pain.   allopurinol 300 MG tablet Commonly known as: ZYLOPRIM Take 1 tablet (300 mg total) by mouth daily. What changed: how much to take   ALPRAZolam 0.5 MG tablet Commonly known as: XANAX Take 0.5 mg by mouth 3 (three) times daily as needed for anxiety.   amLODipine 5 MG tablet Commonly known as: NORVASC Take 5 mg by mouth daily.   atorvastatin 20 MG tablet Commonly known as: LIPITOR Take 1 tablet (20 mg total) by mouth daily.   BIOFREEZE EX Apply 1 application topically daily as needed (pain).   gabapentin 300 MG capsule Commonly known as: NEURONTIN Take 300-600 mg by mouth See admin instructions. Take 600 mg in the morning and 300 mg at night   lisinopril 40 MG tablet Commonly known as: ZESTRIL Take 40 mg by mouth daily.   metFORMIN 500 MG 24 hr tablet Commonly known as: GLUCOPHAGE-XR Take 1,000 mg by mouth at bedtime.   methocarbamol 500 MG tablet Commonly known as: ROBAXIN Take 1 tablet (500 mg total) by mouth every 6 (six) hours as needed for muscle spasms.   metoprolol tartrate 50 MG tablet Commonly known as: LOPRESSOR Take 1 tablet (50 mg total) by mouth 2 (two) times daily.   multivitamin with minerals Tabs tablet Take 1 tablet by mouth daily.   oxyCODONE-acetaminophen 5-325 MG  tablet Commonly known as: PERCOCET/ROXICET Take 1 tablet by mouth every 6 (six) hours as needed. What changed: reasons to take this   SAW PALMETTO PO Take 2 tablets by mouth daily.   sertraline 100 MG tablet Commonly known as: ZOLOFT Take 100 mg by mouth daily.   tamsulosin 0.4 MG Caps capsule Commonly known as: FLOMAX Take 1 capsule (0.4 mg total) by mouth daily.       Disposition: home   Final Dx: acdf C3-4, C4-5  Discharge Instructions     Remove dressing in 72 hours   Complete by: As directed    Call MD for:  difficulty breathing, headache or visual disturbances   Complete by: As directed    Call MD for:  hives   Complete by: As directed    Call MD for:  persistant dizziness or light-headedness   Complete by: As directed    Call MD for:  persistant nausea and vomiting   Complete by: As directed    Call MD for:  redness, tenderness, or signs of infection (pain, swelling, redness, odor or green/yellow discharge around incision site)   Complete by: As directed    Call MD for:  severe uncontrolled pain   Complete by: As directed    Call MD for:  temperature >100.4   Complete by: As directed    Diet - [name] sodium heart healthy   Complete by: As directed    Driving Restrictions   Complete by: As directed    No driving for 2 weeks, no riding in the car for 1 week   Increase activity slowly   Complete by: As directed    Lifting restrictions   Complete by: As directed    No lifting more than 8 lbs         Signed: Tiana Loft Makaylie Dedeaux 02/16/2020, 8:49 AM

## 2020-02-16 NOTE — Progress Notes (Signed)
Occupational Therapy Treatment Patient Details Name: Tommy W Brierley Jr. MRN: [id number] DOB: 08/01/1960 Today's Date: 02/16/2020    History of present illness Patient is a 59 y/o male with history of neck pain who is s/p ACDF C3-5 on 12/13. Patient with PMH of anxiety, depression, DM, GERD, gout, HTN, and sleep apnea.   OT comments  Pt progressing with OOB activity. Pt able to state precautions. Pt requires increased time for dressing routine, but able to perform himself. Pt education provided for AE as pt had already dressed himself. Pt was looking online to buy hip kit as pt needs to be able to care for self and pt's wife. pt aware of precautions and reading handout provided.  Pt does not want therapy for BUEs as he has torn rotator cuff on R side and plans to take care of that soon. Pt does not require additional OT skilled services as pt closet o baseline and BLEs are 'no longer numb' per pt. OT signing off at this time.   Follow Up Recommendations  Follow surgeon's recommendation for DC plan and follow-up therapies;Supervision - Intermittent    Equipment Recommendations  3 in 1 bedside commode    Recommendations for Other Services      Precautions / Restrictions Precautions Precautions: Cervical Precaution Booklet Issued: Yes (comment) Precaution Comments: education provided for cervical precautions Required Braces or Orthoses: Cervical Brace Cervical Brace: Soft collar;For comfort Restrictions Weight Bearing Restrictions: No       Mobility Bed Mobility               General bed mobility comments: in recliner pre and post session  Transfers Overall transfer level: Modified independent Equipment used: None Transfers: Sit to/from Stand Sit to Stand: Modified independent (Device/Increase time)         General transfer comment: Increased time, no physical assist    Balance Overall balance assessment: No apparent balance deficits (not formally assessed)                                          ADL either performed or assessed with clinical judgement   ADL Overall ADL's : At baseline                                       General ADL Comments: Pt requires increased time for dressing routine, but able to perform himself. Pt education provided for AE as pt had already dressed himself. Pt was looking online to buy hip kit as pt needs to be able to care for self and pt's wife. pt aware of precautions and reading handout provided.     Vision   Vision Assessment?: No apparent visual deficits Additional Comments: wears glasses   Perception     Praxis      Cognition Arousal/Alertness: Awake/alert Behavior During Therapy: WFL for tasks assessed/performed Overall Cognitive Status: Within Functional Limits for tasks assessed                                          Exercises     Shoulder Instructions       General Comments      Pertinent Vitals/ Pain         Pain Assessment: Faces Faces Pain Scale: Hurts a little bit Pain Location: neck Pain Descriptors / Indicators: Grimacing;Guarding Pain Intervention(s): Monitored during session  Home Living                                          Prior Functioning/Environment              Frequency  Min 2X/week        Progress Toward Goals  OT Goals(current goals can now be found in the care plan section)  Progress towards OT goals: Progressing toward goals  Acute Rehab OT Goals Patient Stated Goal: to go home OT Goal Formulation: With patient Time For Goal Achievement: 02/29/20 Potential to Achieve Goals: Good ADL Goals Pt Will Perform Lower Body Dressing: with supervision;with adaptive equipment;sit to/from stand Pt Will Transfer to Toilet: with supervision  Plan      Co-evaluation                 AM-PAC OT "6 Clicks" Daily Activity     Outcome Measure   Help from another person eating meals?:  None Help from another person taking care of personal grooming?: None Help from another person toileting, which includes using toliet, bedpan, or urinal?: None Help from another person bathing (including washing, rinsing, drying)?: A Little Help from another person to put on and taking off regular upper body clothing?: None Help from another person to put on and taking off regular lower body clothing?: A Little 6 Click Score: 22    End of Session Equipment Utilized During Treatment: Cervical collar  OT Visit Diagnosis: Muscle weakness (generalized) (M62.81);Pain   Activity Tolerance Patient tolerated treatment well   Patient Left in chair;with call bell/phone within reach   Nurse Communication Mobility status        Time: 0930-0945 OT Time Calculation (min): 15 min  Charges: OT General Charges $OT Visit: 1 Visit OT Treatments $Therapeutic Activity: 8-22 mins  Allison C, OTR/L Acute Rehabilitation Services Pager: [phone] Office: [phone]    ALLISON C 02/16/2020, 11:30 AM    

## 2020-02-17 ENCOUNTER — Encounter (HOSPITAL_COMMUNITY): Payer: Self-pay | Admitting: Neurological Surgery

## 2020-04-20 ENCOUNTER — Other Ambulatory Visit: Payer: Self-pay

## 2020-04-20 ENCOUNTER — Encounter: Payer: Self-pay | Admitting: Orthopedic Surgery

## 2020-04-20 ENCOUNTER — Ambulatory Visit (INDEPENDENT_AMBULATORY_CARE_PROVIDER_SITE_OTHER): Payer: Medicare Other | Admitting: Orthopedic Surgery

## 2020-04-20 VITALS — BP 140/80 | HR 68 | Ht 67.0 in | Wt 260.0 lb

## 2020-04-20 DIAGNOSIS — M75121 Complete rotator cuff tear or rupture of right shoulder, not specified as traumatic: Secondary | ICD-10-CM

## 2020-04-20 NOTE — Progress Notes (Signed)
Chief Complaint  Patient presents with  . Shoulder Pain    Right / discuss surgery     60 year old male recently had cervical fusion for spinal stenosis  He also has had a longstanding rotator cuff tear of his right shoulder which has a supraspinatus and infraspinatus tear with retraction towards the glenoid there is some atrophy in the infraspinatus and some glenohumeral arthritis  He has finally gotten the neck fixed and now is just to have his shoulder fixed  Complains of pain decreased range of motion loss of function of the right arm with pseudoparalysis    Past Medical History:  Diagnosis Date  . Anxiety   . Bladder stone   . Depression   . Diabetes mellitus without complication (HCC)   . GERD (gastroesophageal reflux disease)   . Gout   . History of kidney stones   . Hypertension   . Sleep apnea    Social History   Tobacco Use  . Smoking status: Never Smoker  . Smokeless tobacco: Current User    Types: Snuff  Vaping Use  . Vaping Use: Never used  Substance Use Topics  . Alcohol use: Yes    Alcohol/week: 3.0 - 4.0 standard drinks    Types: 2 - 3 Standard drinks or equivalent, 1 Glasses of wine per week  . Drug use: No    Types: "Crack" cocaine, Marijuana    Comment: Last cocaine - 2015. Last MJ 10/2016.   Past Surgical History:  Procedure Laterality Date  . ANTERIOR CERVICAL DECOMP/DISCECTOMY FUSION N/A 02/15/2020   Procedure: Anterior Cervical Discectomy and Fusion Cervical Three-Cervical Four/Cervical Four-Cervical Five with Hardware Removal;  Surgeon: Tia Alert, MD;  Location: [facility] OR;  Service: Neurosurgery;  Laterality: N/A;  3C  . BICEPS TENDON REPAIR Left   . CERVICAL FUSION    . HERNIA REPAIR Right    inguinal  . LITHOTRIPSY    . SHOULDER ARTHROSCOPY WITH ROTATOR CUFF REPAIR AND SUBACROMIAL DECOMPRESSION Left 07/29/2012   Procedure: LEFT SHOULDER ARTHROSCOPY WITH SUBACROMIAL DECOMPRESSION, DISTAL CLAVICLE RESECTION/ROTATOR CUFF REPAIR ;  Surgeon:  Senaida Lange, MD;  Location: [facility] OR;  Service: Orthopedics;  Laterality: Left;  . SHOULDER SURGERY    . UMBILICAL HERNIA REPAIR       BP 140/80   Pulse 68   Ht 5\' 7"  (1.702 m)   Wt 260 lb (117.9 kg)   BMI 40.72 kg/m   Social History   Tobacco Use  . Smoking status: Never Smoker  . Smokeless tobacco: Current User    Types: Snuff  Vaping Use  . Vaping Use: Never used  Substance Use Topics  . Alcohol use: Yes    Alcohol/week: 3.0 - 4.0 standard drinks    Types: 2 - 3 Standard drinks or equivalent, 1 Glasses of wine per week  . Drug use: No    Types: "Crack" cocaine, Marijuana    Comment: Last cocaine - 2015. Last MJ 10/2016.   Physical Exam Constitutional:      General: He is not in acute distress.    Appearance: He is well-developed.     Comments: Well developed, well nourished Normal grooming and hygiene     Cardiovascular:     Comments: No peripheral edema Musculoskeletal:     Comments: Limited range of motion cervical spine secondary to patient's anatomy is a very short neck.  He is recently had surgery.  He does not have any pain in his cervical spine.  He has pseudoparalysis of  his right shoulder with active range of motion Arm at side external rotation 0 Abduction 40 Flexion 15  Passive range of motion 130 degrees of flexion Arm at side 30 degrees external rotation Abduction 100  Mild pain with passive range of motion  More pain with active range of motion  We tested him for a drop arm sign and he could hold the arm up but on the way down the shoulder gave way  Skin:    General: Skin is warm and dry.  Neurological:     Mental Status: He is alert and oriented to person, place, and time.     Sensory: No sensory deficit.     Coordination: Coordination normal.     Gait: Gait normal.     Deep Tendon Reflexes: Reflexes are normal and symmetric.  Psychiatric:        Mood and Affect: Mood normal.        Behavior: Behavior normal.        Thought Content:  Thought content normal.        Judgment: Judgment normal.     Comments: Affect normal      MRI was done September 2021 IMPRESSION: 1. Full-thickness full width rupture of the supraspinatus tendon retracted back 4.1 cm to the plane of the glenoid. 2. Full-thickness full width tear of the infraspinatus tendon retracted back 3.5 cm nearly to the plane of the glenoid. Edema in the infraspinatus muscle likely reflecting denervation edema from recent rupture of the tendon. 3. Mild to moderate subscapularis tendinopathy. 4. Fatty atrophy of the teres minor muscle probably from prior quadrilateral space syndrome. 5. Partial tearing of the intra-articular segment of the long head of the biceps. 6. Moderate degenerative AC joint and glenohumeral joint arthropathy.     Electronically Signed   By: Gaylyn Rong M.D.   This situation is beyond the scope of my clinical expertise or surgical expertise and I have advised the patient to see a shoulder specialist for reverse shoulder replacement  I drew pictures I placed reverse shoulder surgery handout for him to take home and review  He has surgery with Dr. Darol Destine that he had good result on his left shoulder and is okay with seeing him again

## 2020-04-20 NOTE — Patient Instructions (Addendum)
REFERRAL TO [facility] FOR REVERSE TOTAL SHOULDER    Reverse Total Shoulder Replacement Reverse total shoulder replacement is a surgery to replace the shoulder joint. You may need this surgery if your rotator cuff is torn and cannot be repaired. The rotator cuff is a group of muscles and strong tissues that connect muscle to bone (tendons) in the shoulder joint. The rotator cuff helps you lift your arm. You may also need a reverse total shoulder replacement if you have:  A previously unsuccessful normal shoulder replacement.  Severe pain that keeps you from lifting your arm.  A severe fracture of your shoulder joint.  Repeated dislocations of your shoulder joint.  A tumor in your shoulder joint. The shoulder is a ball-and-socket joint. The top of the upper arm bone (humerus) is shaped like a ball, and it fits into the socket of the shoulder blade (scapula). During a normal shoulder replacement, a plastic cup replaces the socket, and a metal ball replaces the ball of the humerus. This allows the rotator cuff to lift the arm, like it normally does. During a reverse total shoulder replacement, the plastic socket is placed into the top of the humerus, and the metal ball is placed into the shoulder socket. This means that the positions of the ball and socket are reversed. This lets you use other shoulder muscles to lift your arm, instead of using the rotator cuff. Tell a health care provider about:  Any allergies you have.  All medicines you are taking, including vitamins, herbs, eye drops, creams, and over-the-counter medicines.  Any problems you or family members have had with anesthetic medicines.  Any blood disorders you have.  Any surgeries you have had.  Any medical conditions you have.  Whether you are pregnant or may be pregnant. What are the risks? Generally, this is a safe procedure. However, problems may occur, including:  Infection.  Bleeding.  Blood clots.  Allergic  reactions to medicines.  Damage to nearby structures or organs, such as blood vessels or nerves.  Problems with the shoulder, such as: ? Loosening of the new shoulder parts over time, which may require replacement. ? Poor return of shoulder movement. ? Shoulder pain. ? The ball and socket separating (dislocation). What happens before the procedure? Staying hydrated Follow instructions from your health care provider about hydration, which may include:  Up to 2 hours before the procedure - you may continue to drink clear liquids, such as water, clear fruit juice, black coffee, and plain tea.   Eating and drinking restrictions Follow instructions from your health care provider about eating and drinking, which may include:  8 hours before the procedure - stop eating heavy meals or foods, such as meat, fried foods, or fatty foods.  6 hours before the procedure - stop eating light meals or foods, such as toast or cereal.  6 hours before the procedure - stop drinking milk or drinks that contain milk.  2 hours before the procedure - stop drinking clear liquids. Medicines Ask your health care provider about:  Changing or stopping your regular medicines. This is especially important if you are taking diabetes medicines or blood thinners.  Taking medicines such as aspirin and ibuprofen. These medicines can thin your blood. Do not take these medicines unless your health care provider tells you to take them.  Taking over-the-counter medicines, vitamins, herbs, and supplements. Tests You may have tests, such as:  Blood tests.  Chest X-rays.  Heart tests. General instructions  Do not  use any products that contain nicotine or tobacco for at least 4 weeks before the procedure. These products include cigarettes, e-cigarettes, and chewing tobacco. If you need help quitting, ask your health care provider.  Keep your body and teeth clean. Germs from anywhere in your body can travel to your new  joint and infect it. Tell your health care provider if you: ? Plan to have dental care and routine cleanings. ? Develop any skin infections.  Ask your health care provider: ? How your surgery site will be marked. ? What steps will be taken to help prevent infection. These steps may include:  Removing hair at the surgery site.  Washing skin with a germ-killing soap.  Receiving antibiotic medicine.  Plan to have a responsible adult take you home from the hospital or clinic.  Plan to have someone help around the house for a few weeks after your procedure. What happens during the procedure?  An IV will be inserted into one of your veins.  You will be given one or more of the following: ? A medicine to help you relax (sedative). ? A medicine to numb the area (local anesthetic). ? A medicine to make you fall asleep (general anesthetic). ? A medicine that is injected into an area of your body to numb everything below the injection site (regional anesthetic).  An incision will be made in the front or the top of your shoulder.  Your shoulder joint will be opened and cleaned, and the ball of the humerus will be removed from the socket.  A metal ball will be screwed into your scapula.  The plastic socket will be inserted into the top of your humerus and held in place.  The plastic socket will be positioned onto the metal ball and fixed into place.  Your incision will be closed with stitches (sutures) or staples.  Your incision will be covered with a bandage (dressing) or other wound covering.  Your arm will be put in a sling. This will keep your arm still while it heals. The procedure may vary among health care providers and hospitals. What happens after the procedure?  Your blood pressure, heart rate, breathing rate, and blood oxygen level will be monitored until you leave the hospital or clinic.  You may continue to receive fluids and medicines, such as pain medicines or  antibiotics, through an IV.  You will be shown exercises to do at home to improve movement and strength in your shoulder (physical therapy).  If you were given a sedative during the procedure, it can affect you for several hours. Do not drive or operate machinery until your health care provider says that it is safe. Summary  A reverse total shoulder replacement may be done if you have shoulder pain due to a rotator cuff tear that cannot be repaired, a severe fracture or repeated dislocations, or a previous unsuccessful shoulder replacement.  Follow instructions from your health care provider about eating and drinking before the surgery.  Plan to have someone take you home and help around the house for a few weeks after your procedure.  You will be shown exercises to do at home to improve movement and strength in your shoulder (physical therapy). This information is not intended to replace advice given to you by your health care provider. Make sure you discuss any questions you have with your health care provider. Document Revised: 08/05/2019 Document Reviewed: 08/05/2019 Elsevier Patient Education  2021 [employer].

## 2020-06-13 NOTE — Patient Instructions (Addendum)
DUE TO COVID-19 ONLY ONE VISITOR IS ALLOWED TO COME WITH YOU AND STAY IN THE WAITING ROOM ONLY DURING PRE OP AND PROCEDURE DAY OF [name]. THE 1 VISITOR  MAY VISIT WITH YOU AFTER [name] IN YOUR PRIVATE ROOM DURING VISITING HOURS ONLY!  YOU NEED TO HAVE A COVID 19 TEST ON: 06/20/20 @ 1:30 PM , THIS TEST MUST BE DONE BEFORE [name],  COVID TESTING SITE [address] [place] [address], IT IS ON THE [name] GOING OUT [address] APPROXIMATELY  2 MINUTES PAST [employer] ON THE [name]. ONCE YOUR COVID TEST IS COMPLETED,  PLEASE BEGIN THE QUARANTINE INSTRUCTIONS AS OUTLINED IN YOUR HANDOUT.                [name].   Your procedure is scheduled on: 06/23/20   Report to [facility] Main  Entrance   Report to admitting at: 7:30 AM     Call this number if you have problems the morning of [name] [phone]    Remember:  NO SOLID FOOD AFTER MIDNIGHT THE NIGHT PRIOR TO [name]. NOTHING BY MOUTH EXCEPT CLEAR LIQUIDS UNTIL: 7:00 AM.  CLEAR LIQUID DIET  Foods Allowed                                                                     Foods Excluded  Coffee and tea, regular and decaf                             liquids that you cannot  Plain Jell-O any favor except red or purple                                           see through such as: Fruit ices (not with fruit pulp)                                     milk, soups, orange juice  Iced Popsicles                                    All solid food Carbonated beverages, regular and diet                                    Cranberry, grape and apple juices Sports drinks like Gatorade Lightly seasoned clear broth or consume(fat free) Sugar, honey syrup  Sample Menu Breakfast                                Lunch                                     Supper Cranberry juice  Beef broth                            Chicken broth Jell-O                                     Grape juice                            Apple juice Coffee or tea                        Jell-O                                      Popsicle                                                Coffee or tea                        Coffee or tea  _____________________________________________________________________   BRUSH YOUR TEETH MORNING OF [name] AND RINSE YOUR MOUTH OUT, NO CHEWING GUM CANDY OR MINTS.    Take these medicines the morning of [name] with A SIP OF WATER: allopurinol,metoprolol,tamsulosin,amlodipine,gabapentin,sertraline.Alprazolam as needed.  How to Manage Your Diabetes Before and After [name]  Why is it important to control my blood sugar before and after [name]? . Improving blood sugar levels before and after [name] helps healing and can limit problems. . A way of improving blood sugar control is eating a healthy diet by: o  Eating less sugar and carbohydrates o  Increasing activity/exercise o  Talking with your doctor about reaching your blood sugar goals . High blood sugars (greater than 180 mg/dL) can raise your risk of infections and slow your recovery, so you will need to focus on controlling your diabetes during the weeks before [name]. . Make sure that the doctor who takes care of your diabetes knows about your planned [name] including the date and location.  How do I manage my blood sugar before [name]? . Check your blood sugar at least 4 times a day, starting 2 days before [name], to make sure that the level is not too high or low. o Check your blood sugar the morning of your [name] when you wake up and every 2 hours until you get to the Short Stay unit. . If your blood sugar is less than 70 mg/dL, you will need to treat for low blood sugar: o Do not take insulin. o Treat a low blood sugar (less than 70 mg/dL) with  cup of clear juice (cranberry or apple), 4 glucose tablets, OR glucose gel. o Recheck blood sugar in 15 minutes after treatment (to make sure it is greater than 70  mg/dL). If your blood sugar is not greater than 70 mg/dL on recheck, call [phone] for further instructions. . Report your blood sugar to the short stay nurse when you get to Short Stay.  . If you are admitted to the hospital after [name]: o Your blood sugar will be checked by the staff and you will probably  be given insulin after [name] (instead of oral diabetes medicines) to make sure you have good blood sugar levels. o The goal for blood sugar control after [name] is 80-180 mg/dL.   WHAT DO I DO ABOUT MY DIABETES MEDICATION?  Marland Kitchen. Do not take oral diabetes medicines (pills) the morning of [name].  . THE DAY BEFORE [name], take Metformin as usual.   THE MORNING OF SURGERY, DO NOT TAKE ANY DIABETIC MEDICATIONS DAY OF YOUR [name]                               You may not have any metal on your body including hair pins and              piercings  Do not wear jewelry, lotions, powders or perfumes, deodorant             Men may shave face and neck.   Do not bring valuables to the hospital. [facility] IS NOT             RESPONSIBLE   FOR VALUABLES.  Contacts, dentures or bridgework may not be worn into [name].  Leave suitcase in the car. After [name] it may be brought to your room.     Patients discharged the day of [name] will not be allowed to drive home. IF YOU ARE HAVING [name] AND GOING HOME THE SAME DAY, YOU MUST HAVE AN ADULT TO DRIVE YOU HOME AND BE WITH YOU FOR 24 HOURS. YOU MAY GO HOME BY TAXI OR UBER OR ORTHERWISE, BUT AN ADULT MUST ACCOMPANY YOU HOME AND STAY WITH YOU FOR 24 HOURS.  Name and phone number of your driver:  Special Instructions: N/A              Please read over the following fact sheets you were given: _____________________________________________________________________        [name], you can play an important role.  Because skin is not sterile, your skin needs to be as free of germs as possible.  You  can reduce the number of germs on your skin by washing with CHG (chlorahexidine gluconate) soap before [name].  CHG is an antiseptic cleaner which kills germs and bonds with the skin to continue killing germs even after washing. Please DO NOT use if you have an allergy to CHG or antibacterial soaps.  If your skin becomes reddened/irritated stop using the CHG and inform your nurse when you arrive at Short Stay. Do not shave (including legs and underarms) for at least 48 hours prior to the first CHG shower.  You may shave your face/neck. Please follow these instructions carefully:  1.  Shower with CHG Soap the night before [name] and the  morning of [name].  2.  If you choose to wash your hair, wash your hair first as usual with your  normal  shampoo.  3.  After you shampoo, rinse your hair and body thoroughly to remove the  shampoo.                           4.  Use CHG as you would any other liquid soap.  You can apply chg directly  to the skin and wash                       Gently with a scrungie or  clean washcloth.  5.  Apply the CHG Soap to your body ONLY FROM THE NECK DOWN.   Do not use on face/ open                           Wound or open sores. Avoid contact with eyes, ears mouth and genitals (private parts).                       Wash face,  Genitals (private parts) with your normal soap.             6.  Wash thoroughly, paying special attention to the area where your [name]  will be performed.  7.  Thoroughly rinse your body with warm water from the neck down.  8.  DO NOT shower/wash with your normal soap after using and rinsing off  the CHG Soap.                9.  Pat yourself dry with a clean towel.            10.  Wear clean pajamas.            11.  Place clean sheets on your bed the night of your first shower and do not  sleep with pets. Day of [name] : Do not apply any lotions/deodorants the morning of [name].  Please wear clean clothes to the hospital/[name] center.  FAILURE  TO FOLLOW THESE INSTRUCTIONS MAY RESULT IN THE CANCELLATION OF YOUR [name] PATIENT SIGNATURE_________________________________  NURSE SIGNATURE__________________________________  ________________________________________________________________________   Rogelia Mire  An incentive spirometer is a tool that can help keep your lungs clear and active. This tool measures how well you are filling your lungs with each breath. Taking long deep breaths may help reverse or decrease the chance of developing breathing (pulmonary) problems (especially infection) following:  A long period of time when you are unable to move or be active. BEFORE THE PROCEDURE   If the spirometer includes an indicator to show your best effort, your nurse or respiratory therapist will set it to a desired goal.  If possible, sit up straight or lean slightly forward. Try not to slouch.  Hold the incentive spirometer in an upright position. INSTRUCTIONS FOR USE  1. Sit on the edge of your bed if possible, or sit up as far as you can in bed or on a chair. 2. Hold the incentive spirometer in an upright position. 3. Breathe out normally. 4. Place the mouthpiece in your mouth and seal your lips tightly around it. 5. Breathe in slowly and as deeply as possible, raising the piston or the ball toward the top of the column. 6. Hold your breath for 3-5 seconds or for as long as possible. Allow the piston or ball to fall to the bottom of the column. 7. Remove the mouthpiece from your mouth and breathe out normally. 8. Rest for a few seconds and repeat Steps 1 through 7 at least 10 times every 1-2 hours when you are awake. Take your time and take a few normal breaths between deep breaths. 9. The spirometer may include an indicator to show your best effort. Use the indicator as a goal to work toward during each repetition. 10. After each set of 10 deep breaths, practice coughing to be sure your lungs are clear. If you have an  incision (the cut made at the time of [name]), support your incision when  coughing by placing a pillow or rolled up towels firmly against it. Once you are able to get out of bed, walk around indoors and cough well. You may stop using the incentive spirometer when instructed by your caregiver.  RISKS AND COMPLICATIONS  Take your time so you do not get dizzy or light-headed.  If you are in pain, you may need to take or ask for pain medication before doing incentive spirometry. It is harder to take a deep breath if you are having pain. AFTER USE  Rest and breathe slowly and easily.  It can be helpful to keep track of a log of your progress. Your caregiver can provide you with a simple table to help with this. If you are using the spirometer at home, follow these instructions: SEEK MEDICAL CARE IF:   You are having difficultly using the spirometer.  You have trouble using the spirometer as often as instructed.  Your pain medication is not giving enough relief while using the spirometer.  You develop fever of 100.5 F (38.1 C) or higher. SEEK IMMEDIATE MEDICAL CARE IF:   You cough up bloody sputum that had not been present before.  You develop fever of 102 F (38.9 C) or greater.  You develop worsening pain at or near the incision site. MAKE SURE YOU:   Understand these instructions.  Will watch your condition.  Will get help [name] away if you are not doing well or get worse. Document Released: 07/02/2006 Document Revised: 05/14/2011 Document Reviewed: 09/02/2006 ExitCare Patient Information 2014 [place] [employer].   ________________________________________________________________________ [facility]- Preparing for Total Shoulder Arthroplasty    Before [name], you can play an important role. Because skin is not sterile, your skin needs to be as free of germs as possible. You can reduce the number of germs on your skin by using the following products. . Benzoyl Peroxide  Gel o Reduces the number of germs present on the skin o Applied twice a day to shoulder area starting two days before [name]    ==================================================================  Please follow these instructions carefully:  BENZOYL PEROXIDE 5% GEL  Please do not use if you have an allergy to benzoyl peroxide.   If your skin becomes reddened/irritated stop using the benzoyl peroxide.  Starting two days before [name], apply as follows: 1. Apply benzoyl peroxide in the morning and at night. Apply after taking a shower. If you are not taking a shower clean entire shoulder front, back, and side along with the armpit with a clean wet washcloth.  2. Place a quarter-sized dollop on your shoulder and rub in thoroughly, making sure to cover the front, back, and side of your shoulder, along with the armpit.   2 days before ____ AM   ____ PM              1 day before ____ AM   ____ PM                         3. Do this twice a day for two days.  (Last application is the night before [name], AFTER using the CHG soap as described below).  4. Do NOT apply benzoyl peroxide gel on the day of [name].

## 2020-06-15 ENCOUNTER — Other Ambulatory Visit: Payer: Self-pay

## 2020-06-15 ENCOUNTER — Encounter (HOSPITAL_COMMUNITY)
Admission: RE | Admit: 2020-06-15 | Discharge: 2020-06-15 | Disposition: A | Discharge status: Home / Self Care | Payer: Medicare Other | Source: Ambulatory Visit | Attending: Orthopedic Surgery | Admitting: Orthopedic Surgery

## 2020-06-15 ENCOUNTER — Encounter (HOSPITAL_COMMUNITY): Payer: Self-pay

## 2020-06-15 DIAGNOSIS — Z01812 Encounter for preprocedural laboratory examination: Secondary | ICD-10-CM | POA: Diagnosis not present

## 2020-06-15 HISTORY — DX: Unspecified osteoarthritis, unspecified site: M19.90

## 2020-06-15 LAB — CBC
HCT: 41.8 % (ref 39.0–52.0)
Hemoglobin: 14 g/dL (ref 13.0–17.0)
MCH: 31.2 pg (ref 26.0–34.0)
MCHC: 33.5 g/dL (ref 30.0–36.0)
MCV: 93.1 fL (ref 80.0–100.0)
Platelets: 180 10*3/uL (ref 150–400)
RBC: 4.49 MIL/uL (ref 4.22–5.81)
RDW: 13 % (ref 11.5–15.5)
WBC: 7.8 10*3/uL (ref 4.0–10.5)
nRBC: 0 % (ref 0.0–0.2)

## 2020-06-15 LAB — BASIC METABOLIC PANEL
Anion gap: 11 (ref 5–15)
BUN: 14 mg/dL (ref 6–20)
CO2: 25 mmol/L (ref 22–32)
Calcium: 9.1 mg/dL (ref 8.9–10.3)
Chloride: 104 mmol/L (ref 98–111)
Creatinine, Ser: 0.99 mg/dL (ref 0.61–1.24)
GFR, Estimated: >60 mL/min (ref >60)
Glucose, Bld: 159 mg/dL — ABNORMAL HIGH (ref 70–99)
Potassium: 3.7 mmol/L (ref 3.5–5.1)
Sodium: 140 mmol/L (ref 135–145)

## 2020-06-15 LAB — SURGICAL PCR SCREEN
MRSA, PCR: NEGATIVE
Staphylococcus aureus: POSITIVE — AB

## 2020-06-15 LAB — HEMOGLOBIN A1C
Hgb A1c MFr Bld: 6.2 % — ABNORMAL HIGH (ref 4.8–5.6)
Mean Plasma Glucose: 131.24 mg/dL

## 2020-06-15 LAB — GLUCOSE, CAPILLARY: Glucose-Capillary: 167 mg/dL — ABNORMAL HIGH (ref 70–99)

## 2020-06-15 NOTE — Progress Notes (Signed)
PCR: STAPH POSITIVE. 

## 2020-06-15 NOTE — Progress Notes (Signed)
COVID Vaccine Completed: Yes Date COVID Vaccine completed: 01/2020 [place] COVID vaccine manufacturer:    Moderna  PCP - Dr. Donzetta Sprung Cardiologist -   Chest x-ray -  EKG -  Stress Test -  ECHO -  Cardiac Cath -  Pacemaker/ICD device last checked:  Sleep Study -Yes CPAP - Yes  Fasting Blood Sugar - N/A Checks Blood Sugar __0___ times a day  Blood Thinner Instructions: Aspirin Instructions: Last Dose:  Anesthesia review: Hx: HTN,DIA,OSA(CPAP)  Patient denies shortness of breath, fever, cough and chest pain at PAT appointment   Patient verbalized understanding of instructions that were given to them at the PAT appointment. Patient was also instructed that they will need to review over the PAT instructions again at home before surgery.

## 2020-06-20 ENCOUNTER — Other Ambulatory Visit (HOSPITAL_COMMUNITY)
Admission: RE | Admit: 2020-06-20 | Discharge: 2020-06-20 | Disposition: A | Discharge status: Home / Self Care | Payer: Medicare Other | Source: Ambulatory Visit | Attending: Orthopedic Surgery | Admitting: Orthopedic Surgery

## 2020-06-20 DIAGNOSIS — Z20822 Contact with and (suspected) exposure to covid-19: Secondary | ICD-10-CM | POA: Insufficient documentation

## 2020-06-20 DIAGNOSIS — Z01812 Encounter for preprocedural laboratory examination: Secondary | ICD-10-CM | POA: Diagnosis present

## 2020-06-21 LAB — SARS CORONAVIRUS 2 (TAT 6-24 HRS): SARS Coronavirus 2: NEGATIVE

## 2020-06-23 ENCOUNTER — Ambulatory Visit (HOSPITAL_COMMUNITY)
Admission: RE | Admit: 2020-06-23 | Discharge: 2020-06-23 | Disposition: A | Discharge status: Home / Self Care | Payer: Medicare Other | Source: Ambulatory Visit | Attending: Orthopedic Surgery | Admitting: Orthopedic Surgery

## 2020-06-23 ENCOUNTER — Ambulatory Visit (HOSPITAL_COMMUNITY): Payer: Medicare Other | Admitting: Anesthesiology

## 2020-06-23 ENCOUNTER — Encounter (HOSPITAL_COMMUNITY)
Admission: RE | Disposition: A | Discharge status: Home / Self Care | Payer: Self-pay | Source: Ambulatory Visit | Attending: Orthopedic Surgery

## 2020-06-23 ENCOUNTER — Encounter (HOSPITAL_COMMUNITY): Payer: Self-pay | Admitting: Orthopedic Surgery

## 2020-06-23 DIAGNOSIS — Z720 Tobacco use: Secondary | ICD-10-CM | POA: Insufficient documentation

## 2020-06-23 DIAGNOSIS — M25711 Osteophyte, right shoulder: Secondary | ICD-10-CM | POA: Insufficient documentation

## 2020-06-23 DIAGNOSIS — M75101 Unspecified rotator cuff tear or rupture of right shoulder, not specified as traumatic: Secondary | ICD-10-CM | POA: Diagnosis not present

## 2020-06-23 DIAGNOSIS — Z7984 Long term (current) use of oral hypoglycemic drugs: Secondary | ICD-10-CM | POA: Insufficient documentation

## 2020-06-23 DIAGNOSIS — Z79899 Other long term (current) drug therapy: Secondary | ICD-10-CM | POA: Diagnosis not present

## 2020-06-23 HISTORY — PX: REVERSE SHOULDER ARTHROPLASTY: SHX5054

## 2020-06-23 LAB — GLUCOSE, CAPILLARY
Glucose-Capillary: 128 mg/dL — ABNORMAL HIGH (ref 70–99)
Glucose-Capillary: 144 mg/dL — ABNORMAL HIGH (ref 70–99)

## 2020-06-23 SURGERY — ARTHROPLASTY, SHOULDER, TOTAL, REVERSE
Anesthesia: General | Site: Shoulder | Laterality: Right

## 2020-06-23 MED ORDER — TRANEXAMIC ACID-NACL 1000-0.7 MG/100ML-% IV SOLN
1000.0000 mg | INTRAVENOUS | Status: AC
  Administered 2020-06-23: 1000 mg via INTRAVENOUS
  Filled 2020-06-23: qty 100

## 2020-06-23 MED ORDER — BUPIVACAINE LIPOSOME 1.3 % IJ SUSP
INTRAMUSCULAR | Status: DC | PRN
  Administered 2020-06-23: 10 mL via PERINEURAL

## 2020-06-23 MED ORDER — EPHEDRINE 5 MG/ML INJ
INTRAVENOUS | Status: AC
  Filled 2020-06-23: qty 10

## 2020-06-23 MED ORDER — LACTATED RINGERS IV BOLUS
250.0000 mL | Freq: Once | INTRAVENOUS | Status: AC
  Administered 2020-06-23: 250 mL via INTRAVENOUS

## 2020-06-23 MED ORDER — FENTANYL CITRATE (PF) 100 MCG/2ML IJ SOLN: 25.0000 ug | INTRAMUSCULAR | Status: DC | PRN

## 2020-06-23 MED ORDER — PHENYLEPHRINE HCL-NACL 10-0.9 MG/250ML-% IV SOLN
INTRAVENOUS | Status: DC | PRN
  Administered 2020-06-23: 30 ug/min via INTRAVENOUS

## 2020-06-23 MED ORDER — ONDANSETRON HCL 4 MG/2ML IJ SOLN
INTRAMUSCULAR | Status: AC
  Filled 2020-06-23: qty 2

## 2020-06-23 MED ORDER — FENTANYL CITRATE (PF) 100 MCG/2ML IJ SOLN
50.0000 ug | INTRAMUSCULAR | Status: AC
  Administered 2020-06-23: 50 ug via INTRAVENOUS
  Filled 2020-06-23: qty 2

## 2020-06-23 MED ORDER — ONDANSETRON HCL 4 MG/2ML IJ SOLN
INTRAMUSCULAR | Status: DC | PRN
  Administered 2020-06-23: 4 mg via INTRAVENOUS

## 2020-06-23 MED ORDER — DEXAMETHASONE SODIUM PHOSPHATE 10 MG/ML IJ SOLN
INTRAMUSCULAR | Status: AC
  Filled 2020-06-23: qty 1

## 2020-06-23 MED ORDER — LIDOCAINE 2% (20 MG/ML) 5 ML SYRINGE
INTRAMUSCULAR | Status: DC | PRN
  Administered 2020-06-23: 80 mg via INTRAVENOUS

## 2020-06-23 MED ORDER — LACTATED RINGERS IV SOLN: INTRAVENOUS | Status: DC

## 2020-06-23 MED ORDER — CEFAZOLIN SODIUM-DEXTROSE 2-4 GM/100ML-% IV SOLN
2.0000 g | INTRAVENOUS | Status: AC
  Administered 2020-06-23: 2 g via INTRAVENOUS
  Filled 2020-06-23: qty 100

## 2020-06-23 MED ORDER — SODIUM CHLORIDE 0.9 % IR SOLN
Status: DC | PRN
  Administered 2020-06-23: 1000 mL

## 2020-06-23 MED ORDER — EPHEDRINE SULFATE-NACL 50-0.9 MG/10ML-% IV SOSY
PREFILLED_SYRINGE | INTRAVENOUS | Status: DC | PRN
  Administered 2020-06-23 (×6): 10 mg via INTRAVENOUS

## 2020-06-23 MED ORDER — SUGAMMADEX SODIUM 200 MG/2ML IV SOLN
INTRAVENOUS | Status: DC | PRN
  Administered 2020-06-23: 300 mg via INTRAVENOUS

## 2020-06-23 MED ORDER — BUPIVACAINE HCL (PF) 0.5 % IJ SOLN
INTRAMUSCULAR | Status: DC | PRN
  Administered 2020-06-23: 5 mL via PERINEURAL

## 2020-06-23 MED ORDER — VANCOMYCIN HCL 1000 MG IV SOLR
INTRAVENOUS | Status: AC
  Filled 2020-06-23: qty 1000

## 2020-06-23 MED ORDER — CHLORHEXIDINE GLUCONATE 0.12 % MT SOLN: 15.0000 mL | Freq: Once | OROMUCOSAL | Status: AC

## 2020-06-23 MED ORDER — PHENYLEPHRINE 40 MCG/ML (10ML) SYRINGE FOR IV PUSH (FOR BLOOD PRESSURE SUPPORT)
PREFILLED_SYRINGE | INTRAVENOUS | Status: DC | PRN
  Administered 2020-06-23 (×5): 80 ug via INTRAVENOUS

## 2020-06-23 MED ORDER — NAPROXEN 500 MG PO TABS: 500.0000 mg | ORAL_TABLET | Freq: Two times a day (BID) | ORAL | 1 refills | Status: DC

## 2020-06-23 MED ORDER — ONDANSETRON HCL 4 MG PO TABS: 4.0000 mg | ORAL_TABLET | Freq: Three times a day (TID) | ORAL | 0 refills | Status: DC | PRN

## 2020-06-23 MED ORDER — DEXAMETHASONE SODIUM PHOSPHATE 10 MG/ML IJ SOLN
INTRAMUSCULAR | Status: DC | PRN
  Administered 2020-06-23: 10 mg via INTRAVENOUS

## 2020-06-23 MED ORDER — OXYCODONE-ACETAMINOPHEN 5-325 MG PO TABS: 1.0000 | ORAL_TABLET | ORAL | 0 refills | Status: DC | PRN

## 2020-06-23 MED ORDER — METHOCARBAMOL 500 MG PO TABS: 500.0000 mg | ORAL_TABLET | Freq: Four times a day (QID) | ORAL | 0 refills | Status: DC | PRN

## 2020-06-23 MED ORDER — ORAL CARE MOUTH RINSE
15.0000 mL | Freq: Once | OROMUCOSAL | Status: AC
  Administered 2020-06-23: 15 mL via OROMUCOSAL

## 2020-06-23 MED ORDER — PROPOFOL 10 MG/ML IV BOLUS
INTRAVENOUS | Status: DC | PRN
  Administered 2020-06-23: 140 mg via INTRAVENOUS

## 2020-06-23 MED ORDER — LACTATED RINGERS IV BOLUS
500.0000 mL | Freq: Once | INTRAVENOUS | Status: AC
  Administered 2020-06-23: 500 mL via INTRAVENOUS

## 2020-06-23 MED ORDER — ONDANSETRON HCL 4 MG/2ML IJ SOLN: 4.0000 mg | Freq: Once | INTRAMUSCULAR | Status: DC | PRN

## 2020-06-23 MED ORDER — VANCOMYCIN HCL 1000 MG IV SOLR
INTRAVENOUS | Status: DC | PRN
  Administered 2020-06-23: 1000 mg

## 2020-06-23 MED ORDER — ROCURONIUM BROMIDE 10 MG/ML (PF) SYRINGE
PREFILLED_SYRINGE | INTRAVENOUS | Status: AC
  Filled 2020-06-23: qty 10

## 2020-06-23 MED ORDER — PHENYLEPHRINE 40 MCG/ML (10ML) SYRINGE FOR IV PUSH (FOR BLOOD PRESSURE SUPPORT)
PREFILLED_SYRINGE | INTRAVENOUS | Status: AC
  Filled 2020-06-23: qty 10

## 2020-06-23 MED ORDER — ROCURONIUM BROMIDE 10 MG/ML (PF) SYRINGE
PREFILLED_SYRINGE | INTRAVENOUS | Status: DC | PRN
  Administered 2020-06-23: 60 mg via INTRAVENOUS
  Administered 2020-06-23: 40 mg via INTRAVENOUS

## 2020-06-23 MED ORDER — LIDOCAINE 2% (20 MG/ML) 5 ML SYRINGE
INTRAMUSCULAR | Status: AC
  Filled 2020-06-23: qty 5

## 2020-06-23 MED ORDER — MIDAZOLAM HCL 2 MG/2ML IJ SOLN
1.0000 mg | INTRAMUSCULAR | Status: AC
  Administered 2020-06-23: 1 mg via INTRAVENOUS
  Filled 2020-06-23: qty 2

## 2020-06-23 SURGICAL SUPPLY — 60 items
BLADE SAW SGTL 83.5X18.5 (BLADE) ×3 IMPLANT
COOLER ICEMAN CLASSIC (MISCELLANEOUS) IMPLANT
COVER BACK TABLE 60X90IN (DRAPES) ×3 IMPLANT
COVER SURGICAL LIGHT HANDLE (MISCELLANEOUS) ×3 IMPLANT
CUP SUT UNIV REVERS 39 NEU (Shoulder) ×3 IMPLANT
DERMABOND ADVANCED (GAUZE/BANDAGES/DRESSINGS) ×2
DERMABOND ADVANCED .7 DNX12 (GAUZE/BANDAGES/DRESSINGS) ×1 IMPLANT
DRAPE INCISE IOBAN 66X45 STRL (DRAPES) IMPLANT
DRAPE ORTHO SPLIT 77X108 STRL (DRAPES) ×4
DRAPE SHEET LG 3/4 BI-LAMINATE (DRAPES) ×3 IMPLANT
DRAPE SURG 17X11 SM STRL (DRAPES) ×3 IMPLANT
DRAPE SURG ORHT 6 SPLT 77X108 (DRAPES) ×2 IMPLANT
DRAPE U-SHAPE 47X51 STRL (DRAPES) ×3 IMPLANT
DRESSING AQUACEL AG SP 3.5X10 (GAUZE/BANDAGES/DRESSINGS) ×1 IMPLANT
DRSG AQUACEL AG SP 3.5X10 (GAUZE/BANDAGES/DRESSINGS) ×3
DURAPREP 26ML APPLICATOR (WOUND CARE) ×3 IMPLANT
ELECT BLADE TIP CTD 4 INCH (ELECTRODE) ×3 IMPLANT
ELECT REM PT RETURN 15FT ADLT (MISCELLANEOUS) ×3 IMPLANT
FACESHIELD WRAPAROUND (MASK) ×12 IMPLANT
GLENOID UNI REV MOD 24 +2 LAT (Joint) ×3 IMPLANT
GLENOSPHERE 39+4 LAT/24 UNI RV (Joint) ×3 IMPLANT
GLOVE SURG ENC MOIS LTX SZ7.5 (GLOVE) ×3 IMPLANT
GLOVE SURG ENC MOIS LTX SZ8 (GLOVE) ×3 IMPLANT
GLOVE SURG MICRO LTX SZ7 (GLOVE) ×3 IMPLANT
GLOVE SURG MICRO LTX SZ7.5 (GLOVE) ×3 IMPLANT
GLOVE SURG SYN 7.0 (GLOVE) IMPLANT
GLOVE SURG SYN 7.5  E (GLOVE)
GLOVE SURG SYN 7.5 E (GLOVE) IMPLANT
GLOVE SURG SYN 8.0 (GLOVE) IMPLANT
GOWN STRL REUS W/TWL LRG LVL3 (GOWN DISPOSABLE) ×6 IMPLANT
INSERT HUMERAL 39/+6 (Insert) ×3 IMPLANT
KIT BASIN OR (CUSTOM PROCEDURE TRAY) ×3 IMPLANT
KIT TURNOVER KIT A (KITS) ×3 IMPLANT
MANIFOLD NEPTUNE II (INSTRUMENTS) ×3 IMPLANT
NEEDLE TAPERED W/ NITINOL LOOP (MISCELLANEOUS) ×3 IMPLANT
NS IRRIG 1000ML POUR BTL (IV SOLUTION) ×3 IMPLANT
PACK SHOULDER (CUSTOM PROCEDURE TRAY) ×3 IMPLANT
PAD ARMBOARD 7.5X6 YLW CONV (MISCELLANEOUS) ×3 IMPLANT
PAD COLD SHLDR WRAP-ON (PAD) IMPLANT
PIN NITINOL TARGETER 2.8 (PIN) IMPLANT
PIN SET MODULAR GLENOID SYSTEM (PIN) ×3 IMPLANT
RESTRAINT HEAD UNIVERSAL NS (MISCELLANEOUS) ×3 IMPLANT
SCREW CENTRAL MOD 30MM (Screw) ×3 IMPLANT
SCREW PERI LOCK 5.5X32 (Screw) ×3 IMPLANT
SCREW PERI LOCK 5.5X36 (Screw) ×3 IMPLANT
SCREW PERIPHERAL 5.5X20 LOCK (Screw) ×6 IMPLANT
SLING ARM FOAM STRAP XLG (SOFTGOODS) ×3 IMPLANT
STEM HUMERAL UNIVERS SZ8 (Stem) ×3 IMPLANT
SUCTION FRAZIER HANDLE 12FR (TUBING) ×3
SUCTION TUBE FRAZIER 12FR DISP (TUBING) ×1 IMPLANT
SUT FIBERWIRE #2 38 T-5 BLUE (SUTURE)
SUT MNCRL AB 3-0 PS2 18 (SUTURE) ×3 IMPLANT
SUT MON AB 2-0 CT1 36 (SUTURE) ×3 IMPLANT
SUT VIC AB 1 CT1 36 (SUTURE) ×3 IMPLANT
SUTURE FIBERWR #2 38 T-5 BLUE (SUTURE) IMPLANT
SUTURE TAPE 1.3 40 TPR END (SUTURE) ×2 IMPLANT
SUTURETAPE 1.3 40 TPR END (SUTURE) ×6
TOWEL OR 17X26 10 PK STRL BLUE (TOWEL DISPOSABLE) ×3 IMPLANT
TOWEL OR NON WOVEN STRL DISP B (DISPOSABLE) ×3 IMPLANT
WATER STERILE IRR 1000ML POUR (IV SOLUTION) ×6 IMPLANT

## 2020-06-23 NOTE — Op Note (Signed)
06/23/2020  11:38 AM  PATIENT:   [name].  60 y.o. male  PRE-OPERATIVE DIAGNOSIS:  [name] shoulder rotator cuff tear arthropathy  POST-OPERATIVE DIAGNOSIS: Same  PROCEDURE: [name] shoulder reverse arthroplasty with a press-fit size 8 Arthrex stem, +6 polyethylene insert on the neutral metaphysis, 39/+4 glenosphere on a small/+2 baseplate  SURGEON:  Minetta Krisher, Vania Rea M.D.  ASSISTANTS: [name], PA-C  ANESTHESIA:   General endotracheal and interscalene block with Exparel  EBL: 150 cc  SPECIMEN: None  Drains: None   PATIENT DISPOSITION:  PACU - hemodynamically stable.    PLAN OF CARE: Admit for overnight observation  Brief history:  Patient is a 60 year old male who has had chronic and progressively increasing [name] shoulder pain related to severe rotator cuff tear arthropathy.  Due to his increasing functional rotations and failure to respond to prolonged attempts at conservative management he is brought to the operating room at this time for planned [name] shoulder reverse arthroplasty.  Preoperatively, I counseled the patient regarding treatment options and risks versus benefits thereof.  Possible surgical complications were all reviewed including potential for bleeding, infection, neurovascular injury, persistent pain, loss of motion, anesthetic complication, failure of the implant, and possible need for additional surgery. They understand and accept and agrees with our planned procedure.   Procedure in detail:  After undergoing routine preop evaluation the patient received prophylactic antibiotics and interscalene block with Exparel was [place] in the holding area by the anesthesia department.  Subsequently placed supine on the operating table and underwent the smooth induction of a general endotracheal anesthesia.  Placed into the beachchair position and appropriately padded and protected.  The [name] shoulder girdle region was sterilely prepped and draped in  standard fashion.  Timeout was called.  A deltopectoral approach to the [name] shoulder was made through a 10 cm incision.  Skin flaps were elevated dissection carried deeply and the deltopectoral interval was developed from proximal to this with a vein taken laterally in the upper centimeter of the pectoralis major tendon was tenotomized for exposure.  The conjoined tendon was mobilized and retracted medially and adhesions were divided beneath the deltoid.  The long head biceps tendon was then tenodesed at the upper border of the pectoralis major tendon the proximal segment was unroofed and excised.  The subscapularis was then separated from the lesser tuberosity and then split along the rotator interval superiorly such that it could be tagged and reflected medially.  Capsular attachments were then divided from the anterior and infra margins of the humeral neck allowing deliver the humeral head to the wound.  An oscillating saw was then used to perform our head resection after proper alignment had been determined using the intramedullary guide.  Marginal osteophytes were then removed with a rondure and a metal cap was then placed over the Proximal humeral surface.  The glenoid was then exposed with appropriate retractors and a circumferential labral resection was then completed.  A guidepin was directed into the center of the glenoid with an approximately 10 degree inferior tilt of the glenoid was sent reamed with both central and peripheral reamers and all debris was then carefully removed.  Preparation completed with a central drill and tapped at 30 mm.  The baseplate was then assembled with vancomycin powder applied to the threads of the lag screw and the baseplate was then inserted with excellent fit and fixation.  The peripheral locking screws were all then placed using standard technique with excellent purchase and fixation.  The  glenosphere 39/+4 was impacted onto the baseplate and the central locking screw  was placed.  We then returned our attention to the proximal humerus where the canal was opened and we broached up to a size 8 with excellent fit and purchase.  This was at approximate 25 degrees retroversion.  The metaphysis was then reamed with a neutral reamer.  A trial implant was placed with trial reduction showed good motion good stability good soft tissue balance.  Trial was then removed the final implant was assembled.  Vancomycin powder spread liberally into the humeral canal the final implant was then impacted with excellent fit and fixation.  Trial reductions were then performed and a +6 polygive is the best soft tissue balance with good motion good stability.  The trial polywas removed the final polywas then impacted after the implant was cleaned and dried.  Our final reduction showed excellent motion good stability and good soft tissue balance.  Subscapularis was then confirmed adequate elasticity.  It was repaired back to the eyelets on the collar the implant using the previously placed suture tape sutures.  Arm easily achieve 45 degrees of external rotation without excessive tension on the subscap repair.  Final irrigation was then completed.  Hemostasis was obtained.  The balance of the vancomycin powder was then sprayed liberally throughout the deep soft tissue layers.  The deltopectoral interval was reapproximated with a series of figure-of-eight #0 Vicryl sutures.  2-0 Monocryl used for the subcu layer and intracuticular 3-0 Monocryl for the skin followed by Dermabond and Aquacel dressing.  [name] arm was then placed in a sling.  The patient was awakened, extubated, and taken to the recovery room in stable condition.  [name], PA-C was utilized as an Geophysicist/field seismologist throughout this case, essential for help with positioning the patient, positioning extremity, tissue manipulation, implantation of the prosthesis, suture management, wound closure, and intraoperative decision-making.  Senaida Lange  MD   Contact # [phone]

## 2020-06-23 NOTE — Anesthesia Postprocedure Evaluation (Signed)
Anesthesia Post Note  Patient: [name].  Procedure(s) Performed: REVERSE SHOULDER ARTHROPLASTY (Right Shoulder)     Patient location during evaluation: PACU Anesthesia Type: General Level of consciousness: awake and alert Pain management: pain level controlled Vital Signs Assessment: post-procedure vital signs reviewed and stable Respiratory status: spontaneous breathing, nonlabored ventilation, respiratory function stable and patient connected to nasal cannula oxygen Cardiovascular status: blood pressure returned to baseline and stable Postop Assessment: no apparent nausea or vomiting Anesthetic complications: no   No complications documented.  Last Vitals:  Vitals:   06/23/20 1315 06/23/20 1400  BP: 119/76 116/72  Pulse: 73 70  Resp: 20 16  Temp: 36.9 C   SpO2: 92% 93%    Last Pain:  Vitals:   06/23/20 1400  TempSrc:   PainSc: 4                  Cecile Hearing

## 2020-06-23 NOTE — Discharge Instructions (Signed)
 Kevin M. Supple, M.D., F.A.A.O.S. Orthopaedic Surgery Specializing in Arthroscopic and Reconstructive Surgery of the Shoulder [phone] [address] - [place], [place] [address] - Fax [phone]   POST-OP TOTAL SHOULDER REPLACEMENT INSTRUCTIONS  1. Follow up in the office for your first post-op appointment 10-14 days from the date of your surgery. If you do not already have a scheduled appointment, our office will contact you to schedule.  2. The bandage over your incision is waterproof. You may begin showering with this dressing on. You may leave this dressing on until first follow up appointment within 2 weeks. We prefer you leave this dressing in place until follow up however after 5-7 days if you are having itching or skin irritation and would like to remove it you may do so. Go slow and tug at the borders gently to break the bond the dressing has with the skin. At this point if there is no drainage it is okay to go without a bandage or you may cover it with a light guaze and tape. You can also expect significant bruising around your shoulder that will drift down your arm and into your chest wall. This is very normal and should resolve over several days.   3. Wear your sling/immobilizer at all times except to perform the exercises below or to occasionally let your arm dangle by your side to stretch your elbow. You also need to sleep in your sling immobilizer until instructed otherwise. It is ok to remove your sling if you are sitting in a controlled environment and allow your arm to rest in a position of comfort by your side or on your lap with pillows to give your neck and skin a break from the sling. You may remove it to allow arm to dangle by side to shower. If you are up walking around and when you go to sleep at night you need to wear it.  4. Range of motion to your elbow, wrist, and hand are encouraged 3-5 times daily. Exercise to your hand and fingers helps to reduce  swelling you may experience.   5. Prescriptions for a pain medication and a muscle relaxant are provided for you. It is recommended that if you are experiencing pain that you pain medication alone is not controlling, add the muscle relaxant along with the pain medication which can give additional pain relief. The first 1-2 days is generally the most severe of your pain and then should gradually decrease. As your pain lessens it is recommended that you decrease your use of the pain medications to an "as needed basis'" only and to always comply with the recommended dosages of the pain medications.  6. Pain medications can produce constipation along with their use. If you experience this, the use of an over the counter stool softener or laxative daily is recommended.   7. For additional questions or concerns, please do not hesitate to call the office. If after hours there is an answering service to forward your concerns to the physician on call.  8.Pain control following an exparel block  To help control your post-operative pain you received a nerve block  performed with Exparel which is a long acting anesthetic (numbing agent) which can provide pain relief and sensations of numbness (and relief of pain) in the operative shoulder and arm for up to 3 days. Sometimes it provides mixed relief, meaning you may still have numbness in certain areas of the arm but can still be able to   move  parts of that arm, hand, and fingers. We recommend that your prescribed pain medications  be used as needed. We do not feel it is necessary to "pre medicate" and "stay ahead" of pain.  Taking narcotic pain medications when you are not having any pain can lead to unnecessary and potentially dangerous side effects.    9. Use the ice machine as much as possible in the first 5-7 days from surgery, then you can wean its use to as needed. The ice typically needs to be replaced every 6 hours, instead of ice you can actually freeze  water bottles to put in the cooler and then fill water around them to avoid having to purchase ice. You can have spare water bottles freezing to allow you to rotate them once they have melted. Try to have a thin shirt or light cloth or towel under the ice wrap to protect your skin.   FOR ADDITIONAL INFO ON ICE MACHINE AND INSTRUCTIONS GO TO THE WEBSITE AT  https://www.djogl[employer].com/products/donjoy/donjoy-iceman-[email or web address]  10.  We recommend that you avoid any dental work or cleaning in the first 3 months following your joint replacement. This is to help minimize the possibility of infection from the bacteria in your mouth that enters your bloodstream during dental work. We also recommend that you take an antibiotic prior to your dental work for the first year after your shoulder replacement to further help reduce that risk. Please simply contact our office for antibiotics to be sent to your pharmacy prior to dental work.  11. Dental Antibiotics:  In most cases prophylactic antibiotics for Dental procdeures after total joint surgery are not necessary.  Exceptions are as follows:  1. History of prior total joint infection  2. Severely immunocompromised (Organ Transplant, cancer chemotherapy, Rheumatoid biologic meds such as Humera)  3. Poorly controlled diabetes (A1C &gt; 8.0, blood glucose over 200)  If you have one of these conditions, contact your surgeon for an antibiotic prescription, prior to your dental procedure.   POST-OP EXERCISES  Pendulum Exercises  Perform pendulum exercises while standing and bending at the waist. Support your uninvolved arm on a table or chair and allow your operated arm to hang freely. Make sure to do these exercises passively - not using you shoulder muscles. These exercises can be performed once your nerve block effects have worn off.  Repeat 20 times. Do 3 sessions per day.     

## 2020-06-23 NOTE — Anesthesia Procedure Notes (Signed)
Procedure Name: Intubation Date/Time: 06/23/2020 9:57 AM Performed by: Pearson Grippe, CRNA Pre-anesthesia Checklist: Patient identified, Emergency Drugs available, Suction available and Patient being monitored Patient Re-evaluated:Patient Re-evaluated prior to induction Oxygen Delivery Method: Circle system utilized Preoxygenation: Pre-oxygenation with 100% oxygen Induction Type: IV induction Ventilation: Mask ventilation without difficulty Laryngoscope Size: Miller and 2 Grade View: Grade I Tube type: Oral Tube size: 7.5 mm Number of attempts: 1 Airway Equipment and Method: Stylet and Oral airway Placement Confirmation: ETT inserted through vocal cords under direct vision,  positive ETCO2 and breath sounds checked- equal and bilateral Secured at: 23 cm Tube secured with: Tape Dental Injury: Teeth and Oropharynx as per pre-operative assessment

## 2020-06-23 NOTE — H&P (Signed)
Tommy [name].    Chief Complaint: [name] shoulder rotator cuff tear arthropathy HPI: The patient is a 60 y.o. male with chronic and progressively increasing [name] shoulder pain related to severe rotator cuff tear arthropathy.  Due to his increasing pain and functional rotations and failure to respond to prolonged attempts at conservative management, patient is brought to the operating room at this time for planned [name] shoulder reverse arthroplasty  Past Medical History:  Diagnosis Date  . Anxiety   . Arthritis   . Bladder stone   . Depression   . Diabetes mellitus without complication (HCC)   . GERD (gastroesophageal reflux disease)   . Gout   . History of kidney stones   . Hypertension   . Sleep apnea    CPAP    Past Surgical History:  Procedure Laterality Date  . ANTERIOR CERVICAL DECOMP/DISCECTOMY FUSION N/A 02/15/2020   Procedure: Anterior Cervical Discectomy and Fusion Cervical Three-Cervical Four/Cervical Four-Cervical Five with Hardware Removal;  Surgeon: Tia Alert, MD;  Location: [facility] OR;  Service: Neurosurgery;  Laterality: N/A;  3C  . BICEPS TENDON REPAIR Left   . CERVICAL FUSION    . HERNIA REPAIR [name]    inguinal  . LITHOTRIPSY    . SHOULDER ARTHROSCOPY WITH ROTATOR CUFF REPAIR AND SUBACROMIAL DECOMPRESSION Left 07/29/2012   Procedure: LEFT SHOULDER ARTHROSCOPY WITH SUBACROMIAL DECOMPRESSION, DISTAL CLAVICLE RESECTION/ROTATOR CUFF REPAIR ;  Surgeon: Senaida Lange, MD;  Location: [facility] OR;  Service: Orthopedics;  Laterality: Left;  . SHOULDER SURGERY    . UMBILICAL HERNIA REPAIR      Family History  Problem Relation Age of Onset  . Suicidality Mother   . Lung disease Father     Social History:  reports that he has never smoked. His smokeless tobacco use includes snuff. He reports current alcohol use of about 3.0 - 4.0 standard drinks of alcohol per week. He reports previous drug use. Drugs: "Crack" cocaine and Marijuana.   Medications Prior to Admission   Medication Sig Dispense Refill  . acetaminophen (TYLENOL) 650 MG CR tablet Take 650 mg by mouth every 8 (eight) hours as needed for pain.    Marland Kitchen allopurinol (ZYLOPRIM) 300 MG tablet Take 1 tablet (300 mg total) by mouth daily. (Patient taking differently: Take 450 mg by mouth daily.) 30 tablet 1  . ALPRAZolam (XANAX) 0.5 MG tablet Take 0.5 mg by mouth 3 (three) times daily as needed for anxiety.    Marland Kitchen amLODipine (NORVASC) 5 MG tablet Take 5 mg by mouth daily.    Marland Kitchen atorvastatin (LIPITOR) 20 MG tablet Take 1 tablet (20 mg total) by mouth daily. 30 tablet 1  . gabapentin (NEURONTIN) 300 MG capsule Take 300-600 mg by mouth See admin instructions. Take 600 mg in the morning and 300 mg at night    . lisinopril (ZESTRIL) 40 MG tablet Take 40 mg by mouth daily.    . Menthol, Topical Analgesic, (BIOFREEZE EX) Apply 1 application topically daily as needed (pain).    . metFORMIN (GLUCOPHAGE-XR) 500 MG 24 hr tablet Take 1,000 mg by mouth at bedtime.    . metoprolol tartrate (LOPRESSOR) 50 MG tablet Take 1 tablet (50 mg total) by mouth 2 (two) times daily. 60 tablet 1  . Multiple Vitamin (MULTIVITAMIN WITH MINERALS) TABS tablet Take 1 tablet by mouth daily.    . Saw Palmetto, Serenoa repens, (SAW PALMETTO PO) Take 2 tablets by mouth daily.    . sertraline (ZOLOFT) 100 MG tablet Take 100  mg by mouth daily.     . tadalafil (CIALIS) 5 MG tablet Take 5 mg by mouth daily.    . tamsulosin (FLOMAX) 0.4 MG CAPS capsule Take 1 capsule (0.4 mg total) by mouth daily. 30 capsule 1  . methocarbamol (ROBAXIN) 500 MG tablet Take 1 tablet (500 mg total) by mouth every 6 (six) hours as needed for muscle spasms. 45 tablet 0     Physical Exam: [name] shoulder demonstrates painful and guarded motion as noted at his recent office visits.  Globally decreased strength.  Overall neurovascular intact in the [name] upper extremity.  Plain radiographs as well as MRI scan confirmed degenerative changes with chronic and massively  retracted rotator cuff tear.  Vitals  Temp:  [98 F (36.7 C)] 98 F (36.7 C) (04/21 0747) Pulse Rate:  [55-62] 62 (04/21 0902) Resp:  [11-18] 15 (04/21 0902) BP: (119-132)/(52-84) 122/83 (04/21 0902) SpO2:  [96 %-99 %] 97 % (04/21 0902)  Assessment/Plan  Impression: [name] shoulder rotator cuff tear arthropathy  Plan of Action: Procedure(s): REVERSE SHOULDER ARTHROPLASTY  Brown Dunlap M Aydee Mcnew 06/23/2020, 9:08 AM Contact # [phone]

## 2020-06-23 NOTE — Evaluation (Signed)
Occupational Therapy Evaluation Patient Details Name: [name]. MRN: [id number] DOB: 1960-09-10 Today's Date: 06/23/2020    History of Present Illness Patient is a 60 year old male admitted 4/21 for  Right shoulder reverse arthroplasty. PMH includes cervical sx, shoulder arthroscopy   Clinical Impression   Patient is a 60 year old male s/p shoulder replacement without functional use of right dominant upper extremity secondary to effects of surgery and interscalene block and shoulder precautions. Therapist provided education and instruction to patient in regards to exercises, precautions, positioning, donning upper extremity clothing and bathing while maintaining shoulder precautions, ice and edema management and donning/doffing sling. Patient verbalized understanding and demonstrated as needed. Patient needed assistance to donn shirt, underwear, pants, socks and shoes and provided with instruction on compensatory strategies to perform ADLs. Patient to follow up with MD for further therapy needs.      Follow Up Recommendations  Follow surgeon's recommendation for DC plan and follow-up therapies    Equipment Recommendations  None recommended by OT       Precautions / Restrictions Precautions Precautions: Shoulder Type of Shoulder Precautions: If sitting in controlled environment, ok to come out of sling to give neck a break. Please sleep in it to protect until follow up in office.     OK to use operative arm for feeding, hygiene and ADLs. Ok to instruct Pendulums and lap slides as exercises. Ok to use operative arm within the following parameters for ADL purposes Ok for PROM, AAROM, AROM within pain tolerance and within the following ROM   ER 20   ABD 45   FE 60. AROM elbow, wrist, hand ok Shoulder Interventions: Shoulder sling/immobilizer;Off for dressing/bathing/exercises Precaution Booklet Issued: Yes (comment) Required Braces or Orthoses: Sling Restrictions Weight Bearing  Restrictions: Yes RUE Weight Bearing: Non weight bearing      Mobility Bed Mobility               General bed mobility comments: in chair    Transfers Overall transfer level: Independent                    Balance Overall balance assessment: Mild deficits observed, not formally tested                                         ADL either performed or assessed with clinical judgement   ADL Overall ADL's : Needs assistance/impaired Eating/Feeding: Independent   Grooming: Independent   Upper Body Bathing: Minimal assistance;Sitting   Lower Body Bathing: Minimal assistance;Sitting/lateral leans;Sit to/from stand   Upper Body Dressing : Moderate assistance;Cueing for UE precautions;Cueing for compensatory techniques;Cueing for sequencing;Sitting Upper Body Dressing Details (indicate cue type and reason): assist to thread R UE and pull T shirt overhead Lower Body Dressing: Moderate assistance;Sitting/lateral leans;Sit to/from stand Lower Body Dressing Details (indicate cue type and reason): assist to initiate threading and pull up over R hip Toilet Transfer: Independent   Toileting- Clothing Manipulation and Hygiene: Minimal assistance;Sitting/lateral lean;Sit to/from stand       Functional mobility during ADLs: Independent General ADL Comments: patient educated in compensatory strategies in order to perform ADLs while maintaining shoulder precautions                  Pertinent Vitals/Pain Pain Assessment: Faces Faces Pain Scale: Hurts little more Pain Location: R shoulder Pain Descriptors / Indicators: Heaviness;Numbness Pain Intervention(s):  Monitored during session     Hand Dominance Right   Extremity/Trunk Assessment Upper Extremity Assessment Upper Extremity Assessment: RUE deficits/detail RUE Deficits / Details: + nerve block RUE: Unable to fully assess due to immobilization   Lower Extremity Assessment Lower Extremity  Assessment: Overall WFL for tasks assessed       Communication Communication Communication: No difficulties   Cognition Arousal/Alertness: Awake/alert Behavior During Therapy: WFL for tasks assessed/performed Overall Cognitive Status: Within Functional Limits for tasks assessed                                        Exercises Exercises: Shoulder   Shoulder Instructions Shoulder Instructions Donning/doffing shirt without moving shoulder: Moderate assistance;Patient able to independently direct caregiver Method for sponge bathing under operated UE: Minimal assistance;Patient able to independently direct caregiver Donning/doffing sling/immobilizer: Maximal assistance;Patient able to independently direct caregiver Correct positioning of sling/immobilizer: Patient able to independently direct caregiver Pendulum exercises (written home exercise program): Patient able to independently direct caregiver ROM for elbow, wrist and digits of operated UE: Patient able to independently direct caregiver Sling wearing schedule (on at all times/off for ADL's): Patient able to independently direct caregiver Proper positioning of operated UE when showering: Patient able to independently direct caregiver Dressing change:  (N/A) Positioning of UE while sleeping: Patient able to independently direct caregiver    Home Living Family/patient expects to be discharged to:: Private residence Living Arrangements: Spouse/significant other Available Help at Discharge: Family;Available 24 hours/day Type of Home: House Home Access: Level entry     Home Layout: Two level;Able to live on main level with bedroom/bathroom     Bathroom Shower/Tub: Chief Strategy Officer: Standard     Home Equipment: Cane - single point;Walker - 2 wheels;Shower seat   Additional Comments: sleeps in lift chair      Prior Functioning/Environment Level of Independence: Needs assistance  Gait /  Transfers Assistance Needed: hasnt been needing cane as much since neck surgery ADL's / Homemaking Assistance Needed: patient reports sometimes needing help from spouse with shirt and socks            OT Problem List: Pain;Impaired UE functional use;Obesity;Decreased knowledge of precautions         OT Goals(Current goals can be found in the care plan section) Acute Rehab OT Goals Patient Stated Goal: home OT Goal Formulation: All assessment and education complete, DC therapy   AM-PAC OT "6 Clicks" Daily Activity     Outcome Measure Help from another person eating meals?: None Help from another person taking care of personal grooming?: None Help from another person toileting, which includes using toliet, bedpan, or urinal?: A Little Help from another person bathing (including washing, rinsing, drying)?: A Little Help from another person to put on and taking off regular upper body clothing?: A Lot Help from another person to put on and taking off regular lower body clothing?: A Lot 6 Click Score: 18   End of Session Equipment Utilized During Treatment: Other (comment) (sling) Nurse Communication: Other (comment) (OT complete)  Activity Tolerance: Patient tolerated treatment well Patient left: in chair;with call bell/phone within reach;with nursing/sitter in room;with family/visitor present  OT Visit Diagnosis: Pain Pain - Right/Left: Right Pain - part of body: Shoulder                Time: 1330-1409 OT Time Calculation (min): 39 min Charges:  OT General Charges $OT Visit: 1 Visit OT Evaluation $OT Eval Low Complexity: 1 Low OT Treatments $Self Care/Home Management : 23-37 mins  Marlyce Huge OT OT pager: [phone]   Carmelia Roller 06/23/2020, 2:17 PM

## 2020-06-23 NOTE — Transfer of Care (Signed)
Immediate Anesthesia Transfer of Care Note  Patient: [name].  Procedure(s) Performed: REVERSE SHOULDER ARTHROPLASTY (Right Shoulder)  Patient Location: PACU  Anesthesia Type:GA combined with regional for post-op pain  Level of Consciousness: awake, alert  and oriented  Airway & Oxygen Therapy: Patient Spontanous Breathing and Patient connected to face mask oxygen  Post-op Assessment: Report given to RN and Post -op Vital signs reviewed and stable  Post vital signs: Reviewed and stable  Last Vitals:  Vitals Value Taken Time  BP 118/72   Temp    Pulse 65 06/23/20 1153  Resp 18 06/23/20 1153  SpO2 100 % 06/23/20 1153  Vitals shown include unvalidated device data.  Last Pain:  Vitals:   06/23/20 0747  TempSrc: Oral         Complications: No complications documented.

## 2020-06-23 NOTE — Progress Notes (Signed)
Assisted Dr.Stephen Desmond Lope with right interscalene brachial  block. Side rails up, monitors on throughout procedure. See vital signs in flow sheet. Tolerated Procedure well.

## 2020-06-23 NOTE — Anesthesia Preprocedure Evaluation (Addendum)
Anesthesia Evaluation  Patient identified by MRN, date of birth, ID band Patient awake    Reviewed: Allergy & Precautions, NPO status , Patient's Chart, lab work & pertinent test results, reviewed documented beta blocker date and time   History of Anesthesia Complications Negative for: history of anesthetic complications  Airway Mallampati: II  TM Distance: >3 FB Neck ROM: Limited    Dental  (+) Dental Advisory Given, Missing   Pulmonary sleep apnea and Continuous Positive Airway Pressure Ventilation ,    Pulmonary exam normal breath sounds clear to auscultation       Cardiovascular hypertension, Pt. on medications and Pt. on home beta blockers (-) angina(-) CAD, (-) Past MI and (-) Cardiac Stents Normal cardiovascular exam Rhythm:Regular Rate:Normal     Neuro/Psych PSYCHIATRIC DISORDERS Anxiety Depression S/p ACDF     GI/Hepatic GERD  ,  Endo/Other  diabetes, Type 2, Oral Hypoglycemic AgentsMorbid obesity  Renal/GU      Musculoskeletal  (+) Arthritis ,   Abdominal   Peds  Hematology   Anesthesia Other Findings   Reproductive/Obstetrics                            Anesthesia Physical Anesthesia Plan  ASA: III  Anesthesia Plan: General   Post-op Pain Management:  Regional for Post-op pain   Induction: Intravenous  PONV Risk Score and Plan: 2 and Midazolam, Dexamethasone and Ondansetron  Airway Management Planned: Oral ETT  Additional Equipment:   Intra-op Plan:   Post-operative Plan: Extubation in OR  Informed Consent: I have reviewed the patients History and Physical, chart, labs and discussed the procedure including the risks, benefits and alternatives for the proposed anesthesia with the patient or authorized representative who has indicated his/her understanding and acceptance.     Dental advisory given  Plan Discussed with: CRNA  Anesthesia Plan Comments:          Anesthesia Quick Evaluation

## 2020-06-23 NOTE — Anesthesia Procedure Notes (Signed)
Anesthesia Regional Block: Interscalene brachial plexus block   Pre-Anesthetic Checklist: ,, timeout performed, Correct Patient, Correct Site, Correct Laterality, Correct Procedure, Correct Position, site marked, Risks and benefits discussed,  Surgical consent,  Pre-op evaluation,  At surgeon's request and post-op pain management  Laterality: Right  Prep: chloraprep       Needles:  Injection technique: Single-shot  Needle Type: Echogenic Stimulator Needle     Needle Length: 9cm  Needle Gauge: 22     Additional Needles:   Procedures:,,,, ultrasound used (permanent image in chart),,,,  Narrative:  Start time: 06/23/2020 8:24 AM End time: 06/23/2020 8:34 AM Injection made incrementally with aspirations every 5 mL.  Performed by: Personally  Anesthesiologist: Cecile Hearing, MD  Additional Notes: Functioning IV was confirmed and monitors were applied.  A 25mm 22ga Arrow echogenic stimulator needle was used. Sterile prep and drape, hand hygiene, and sterile gloves were used.  Negative aspiration and negative test dose prior to incremental administration of local anesthetic. The patient tolerated the procedure well.  Ultrasound guidance: relevent anatomy identified, needle position confirmed, local anesthetic spread visualized around nerve(s), vascular puncture avoided.  Image printed for medical record.

## 2020-06-28 ENCOUNTER — Encounter (HOSPITAL_COMMUNITY): Payer: Self-pay | Admitting: Orthopedic Surgery

## 2020-09-02 ENCOUNTER — Other Ambulatory Visit (HOSPITAL_COMMUNITY): Payer: Self-pay | Admitting: *Deleted

## 2021-04-10 ENCOUNTER — Emergency Department (HOSPITAL_COMMUNITY)
Admission: EM | Admit: 2021-04-10 | Discharge: 2021-04-10 | Disposition: A | Discharge status: Home / Self Care | Payer: Medicare Other | Attending: Emergency Medicine | Admitting: Emergency Medicine

## 2021-04-10 ENCOUNTER — Encounter (HOSPITAL_COMMUNITY): Payer: Self-pay

## 2021-04-10 ENCOUNTER — Emergency Department (HOSPITAL_COMMUNITY): Payer: Medicare Other

## 2021-04-10 ENCOUNTER — Other Ambulatory Visit: Payer: Self-pay

## 2021-04-10 DIAGNOSIS — R509 Fever, unspecified: Secondary | ICD-10-CM | POA: Diagnosis present

## 2021-04-10 DIAGNOSIS — K115 Sialolithiasis: Secondary | ICD-10-CM | POA: Diagnosis not present

## 2021-04-10 DIAGNOSIS — Z20822 Contact with and (suspected) exposure to covid-19: Secondary | ICD-10-CM | POA: Diagnosis not present

## 2021-04-10 DIAGNOSIS — K1120 Sialoadenitis, unspecified: Secondary | ICD-10-CM

## 2021-04-10 LAB — COMPREHENSIVE METABOLIC PANEL
ALT: 30 U/L (ref 0–44)
AST: 21 U/L (ref 15–41)
Albumin: 3.7 g/dL (ref 3.5–5.0)
Alkaline Phosphatase: 89 U/L (ref 38–126)
Anion gap: 5 (ref 5–15)
BUN: 16 mg/dL (ref 6–20)
CO2: 28 mmol/L (ref 22–32)
Calcium: 8.6 mg/dL — ABNORMAL LOW (ref 8.9–10.3)
Chloride: 104 mmol/L (ref 98–111)
Creatinine, Ser: 1.03 mg/dL (ref 0.61–1.24)
GFR, Estimated: >60 mL/min (ref >60)
Glucose, Bld: 160 mg/dL — ABNORMAL HIGH (ref 70–99)
Potassium: 3.4 mmol/L — ABNORMAL LOW (ref 3.5–5.1)
Sodium: 137 mmol/L (ref 135–145)
Total Bilirubin: 0.4 mg/dL (ref 0.3–1.2)
Total Protein: 6.5 g/dL (ref 6.5–8.1)

## 2021-04-10 LAB — CBC WITH DIFFERENTIAL/PLATELET
Abs Immature Granulocytes: 0.04 10*3/uL (ref 0.00–0.07)
Basophils Absolute: 0 10*3/uL (ref 0.0–0.1)
Basophils Relative: 0 %
Eosinophils Absolute: 0.3 10*3/uL (ref 0.0–0.5)
Eosinophils Relative: 3 %
HCT: 40.5 % (ref 39.0–52.0)
Hemoglobin: 13.6 g/dL (ref 13.0–17.0)
Immature Granulocytes: 0 %
Lymphocytes Relative: 22 %
Lymphs Abs: 2.5 10*3/uL (ref 0.7–4.0)
MCH: 31.1 pg (ref 26.0–34.0)
MCHC: 33.6 g/dL (ref 30.0–36.0)
MCV: 92.7 fL (ref 80.0–100.0)
Monocytes Absolute: 1 10*3/uL (ref 0.1–1.0)
Monocytes Relative: 8 %
Neutro Abs: 7.8 10*3/uL — ABNORMAL HIGH (ref 1.7–7.7)
Neutrophils Relative %: 67 %
Platelets: 147 10*3/uL — ABNORMAL LOW (ref 150–400)
RBC: 4.37 MIL/uL (ref 4.22–5.81)
RDW: 13.1 % (ref 11.5–15.5)
WBC: 11.7 10*3/uL — ABNORMAL HIGH (ref 4.0–10.5)
nRBC: 0 % (ref 0.0–0.2)

## 2021-04-10 LAB — LACTIC ACID, PLASMA
Lactic Acid, Venous: 1.4 mmol/L (ref 0.5–1.9)
Lactic Acid, Venous: 1.2 mmol/L (ref 0.5–1.9)

## 2021-04-10 LAB — RESP PANEL BY RT-PCR (FLU A&B, COVID) ARPGX2
Influenza A by PCR: NEGATIVE
Influenza B by PCR: NEGATIVE
SARS Coronavirus 2 by RT PCR: NEGATIVE

## 2021-04-10 LAB — GROUP A STREP BY PCR: Group A Strep by PCR: NOT DETECTED

## 2021-04-10 LAB — TSH: TSH: 1.43 u[IU]/mL (ref 0.350–4.500)

## 2021-04-10 LAB — MONONUCLEOSIS SCREEN: Mono Screen: NEGATIVE

## 2021-04-10 MED ORDER — CLINDAMYCIN HCL 150 MG PO CAPS
300.0000 mg | ORAL_CAPSULE | Freq: Once | ORAL | Status: AC
  Administered 2021-04-10: 300 mg via ORAL
  Filled 2021-04-10: qty 2

## 2021-04-10 MED ORDER — CLINDAMYCIN HCL 150 MG PO CAPS: 150.0000 mg | ORAL_CAPSULE | Freq: Four times a day (QID) | ORAL | 0 refills | Status: AC

## 2021-04-10 MED ORDER — IOHEXOL 300 MG/ML  SOLN
75.0000 mL | Freq: Once | INTRAMUSCULAR | Status: AC | PRN
  Administered 2021-04-10: 75 mL via INTRAVENOUS

## 2021-04-10 NOTE — ED Provider Notes (Signed)
[facility] EMERGENCY DEPARTMENT Provider Note   CSN: [id number] Arrival date & time: 04/10/21  0020     History  Chief Complaint  Patient presents with   Fever    [name]. is a 61 y.o. male.  On and off again fever over last few weeks associated with intermittent sore throat. Last night got worse and progressively worsening since then. pain with swallowing. No difficulty breathing. No trismus.    Fever     Home Medications Prior to Admission medications   Medication Sig Start Date End Date Taking? Authorizing Provider  clindamycin (CLEOCIN) 150 MG capsule Take 1 capsule (150 mg total) by mouth 4 (four) times daily for 7 days. 04/10/21 04/17/21 Yes Tashonda Pinkus, Barbara Cower, MD  acetaminophen (TYLENOL) 650 MG CR tablet Take 650 mg by mouth every 8 (eight) hours as needed for pain.    [provider]  allopurinol (ZYLOPRIM) 300 MG tablet Take 1 tablet (300 mg total) by mouth daily. Patient taking differently: Take 450 mg by mouth daily. 07/01/18   Jacquelin Hawking, PA-C  ALPRAZolam Prudy Feeler) 0.5 MG tablet Take 0.5 mg by mouth 3 (three) times daily as needed for anxiety.    [provider]  amLODipine (NORVASC) 5 MG tablet Take 5 mg by mouth daily.    [provider]  atorvastatin (LIPITOR) 20 MG tablet Take 1 tablet (20 mg total) by mouth daily. 07/01/18   Jacquelin Hawking, PA-C  gabapentin (NEURONTIN) 300 MG capsule Take 300-600 mg by mouth See admin instructions. Take 600 mg in the morning and 300 mg at night    [provider]  lisinopril (ZESTRIL) 40 MG tablet Take 40 mg by mouth daily.    [provider]  Menthol, Topical Analgesic, (BIOFREEZE EX) Apply 1 application topically daily as needed (pain).    [provider]  metFORMIN (GLUCOPHAGE-XR) 500 MG 24 hr tablet Take 1,000 mg by mouth at bedtime. 12/07/19   [provider]  methocarbamol (ROBAXIN) 500 MG tablet Take 1 tablet (500 mg total) by mouth every 6 (six) hours  as needed for muscle spasms. 06/23/20   Shuford, French Ana, PA-C  metoprolol tartrate (LOPRESSOR) 50 MG tablet Take 1 tablet (50 mg total) by mouth 2 (two) times daily. 07/01/18   Jacquelin Hawking, PA-C  Multiple Vitamin (MULTIVITAMIN WITH MINERALS) TABS tablet Take 1 tablet by mouth daily.    [provider]  naproxen (NAPROSYN) 500 MG tablet Take 1 tablet (500 mg total) by mouth 2 (two) times daily with a meal. 06/23/20   Shuford, French Ana, PA-C  ondansetron (ZOFRAN) 4 MG tablet Take 1 tablet (4 mg total) by mouth every 8 (eight) hours as needed for nausea or vomiting. 06/23/20   Shuford, French Ana, PA-C  oxyCODONE-acetaminophen (PERCOCET) 5-325 MG tablet Take 1 tablet by mouth every 4 (four) hours as needed (max 6 q). 06/23/20   Shuford, Kenmar, PA-C  Saw Palmetto, Serenoa repens, (SAW PALMETTO PO) Take 2 tablets by mouth daily.    [provider]  sertraline (ZOLOFT) 100 MG tablet Take 100 mg by mouth daily.     [provider]  tadalafil (CIALIS) 5 MG tablet Take 5 mg by mouth daily.    [provider]  tamsulosin (FLOMAX) 0.4 MG CAPS capsule Take 1 capsule (0.4 mg total) by mouth daily. 07/01/18   Jacquelin Hawking, PA-C      Allergies    Kiwi extract and Strawberry extract    Review of Systems   Review of Systems  Constitutional:  Positive for fever.   Physical Exam Updated Vital Signs BP (!) 142/80    Pulse (!) 56    Temp 98 F (36.7 C) (Oral)    Resp 20    Ht 5\' 7"  (1.702 m)    Wt 113.4 kg    SpO2 98%    BMI 39.16 kg/m  Physical Exam Vitals and nursing note reviewed.  Constitutional:      Appearance: He is well-developed.  HENT:     Head: Normocephalic and atraumatic.     Nose: Nose normal. No congestion or rhinorrhea.     Mouth/Throat:     Mouth: Mucous membranes are moist.     Pharynx: Oropharynx is clear.     Comments: Edema and induration to right lower chin/jaw area Uvula midline Eyes:     Pupils: Pupils are equal, round, and reactive to light.   Cardiovascular:     Rate and Rhythm: Normal rate.  Pulmonary:     Effort: Pulmonary effort is normal. No respiratory distress.  Abdominal:     General: Abdomen is flat. There is no distension.  Musculoskeletal:        General: Normal range of motion.     Cervical back: Normal range of motion.  Skin:    General: Skin is warm and dry.  Neurological:     General: No focal deficit present.     Mental Status: He is alert.    ED Results / Procedures / Treatments   Labs (all labs ordered are listed, but only abnormal results are displayed) Labs Reviewed  CBC WITH DIFFERENTIAL/PLATELET - Abnormal; Notable for the following components:      Result Value   WBC 11.7 (*)    Platelets 147 (*)    Neutro Abs 7.8 (*)    All other components within normal limits  COMPREHENSIVE METABOLIC PANEL - Abnormal; Notable for the following components:   Potassium 3.4 (*)    Glucose, Bld 160 (*)    Calcium 8.6 (*)    All other components within normal limits  GROUP A STREP BY PCR  RESP PANEL BY RT-PCR (FLU A&B, COVID) [id number]  LACTIC ACID, PLASMA  LACTIC ACID, PLASMA  TSH  MONONUCLEOSIS SCREEN    EKG None  Radiology CT Soft Tissue Neck W Contrast  Result Date: 04/10/2021 CLINICAL DATA:  Fever and swollen lymph nodes in neck EXAM: CT NECK WITH CONTRAST TECHNIQUE: Multidetector CT imaging of the neck was performed using the standard protocol following the bolus administration of intravenous contrast. RADIATION DOSE REDUCTION: This exam was performed according to the departmental dose-optimization program which includes automated exposure control, adjustment of the mA and/or kV according to patient size and/or use of iterative reconstruction technique. CONTRAST:  39mL OMNIPAQUE IOHEXOL 300 MG/ML  SOLN COMPARISON:  None. FINDINGS: Pharynx and larynx: Normal. No mass or swelling. Salivary glands: Enlargement and hyperenhancement of the right submandibular gland (series 2, image 57). A 6 mm stone is noted  in the distal right submandibular duct (series 2, image 53). The left submandibular gland and bilateral parotid glands are unremarkable. Thyroid: Normal. Lymph nodes: None enlarged or abnormal density. Vascular: Negative. Limited intracranial: Negative. Visualized orbits: Negative. Mastoids and visualized paranasal sinuses: Well-aerated. Skeleton: Status post ACDF C3-C5 and C6-C7. No acute osseous abnormality. Upper chest: Focal pulmonary opacity or pleural effusion. Other: Stranding in the soft tissues around the right submandibular gland, with mild thickening of the right-greater-than-left platysma. IMPRESSION: Enlargement and hyperenhancement of the right submandibular gland, with  surrounding stranding, consistent with sialadenitis. A 6 mm stone appears lodged in the distal right submandibular duct. Electronically Signed   By: Wiliam Ke M.D.   On: 04/10/2021 03:01    Procedures Procedures    Medications Ordered in ED Medications  iohexol (OMNIPAQUE) 300 MG/ML solution 75 mL (75 mLs Intravenous Contrast Given 04/10/21 0236)  clindamycin (CLEOCIN) capsule 300 mg (300 mg Oral Given 04/10/21 0420)    ED Course/ Medical Decision Making/ A&P                           Medical Decision Making Amount and/or Complexity of Data Reviewed Labs: ordered. Radiology: ordered.  Risk Prescription drug management.   Sialolithiasis with sialoadenitis. No airway issues. Will start clinda. Suggest NSAID and secretalogues. FU w/ ENT if not improving. Here if worsening.   Final Clinical Impression(s) / ED Diagnoses Final diagnoses:  Sialadenitis  Sialolithiasis    Rx / DC Orders ED Discharge Orders          Ordered    clindamycin (CLEOCIN) 150 MG capsule  4 times daily        04/10/21 0410              Ranyia Witting, Barbara Cower, MD 04/10/21 [phone]

## 2021-04-10 NOTE — ED Triage Notes (Signed)
Pov from home with cc of fever and swollen lymph nodes in his neck. York Spaniel that he is unsure what his temp was because he hasnt taken it. Has been feeling thi sway since Friday. Wife was sick 3 weeks ago. Fever and sore throat 2 weeks ago. Did take ibuprofen 8pm

## 2021-08-15 IMAGING — CR DG CHEST 2V
2 series · 2 of 2 positions shown · non-contrast
Comparison: Chest x-ray 07/29/2012.

CLINICAL DATA: 59-year-old male under preoperative evaluation for
ACDF at C3-C5.

EXAM:
CHEST - 2 VIEW

[w chest pa]
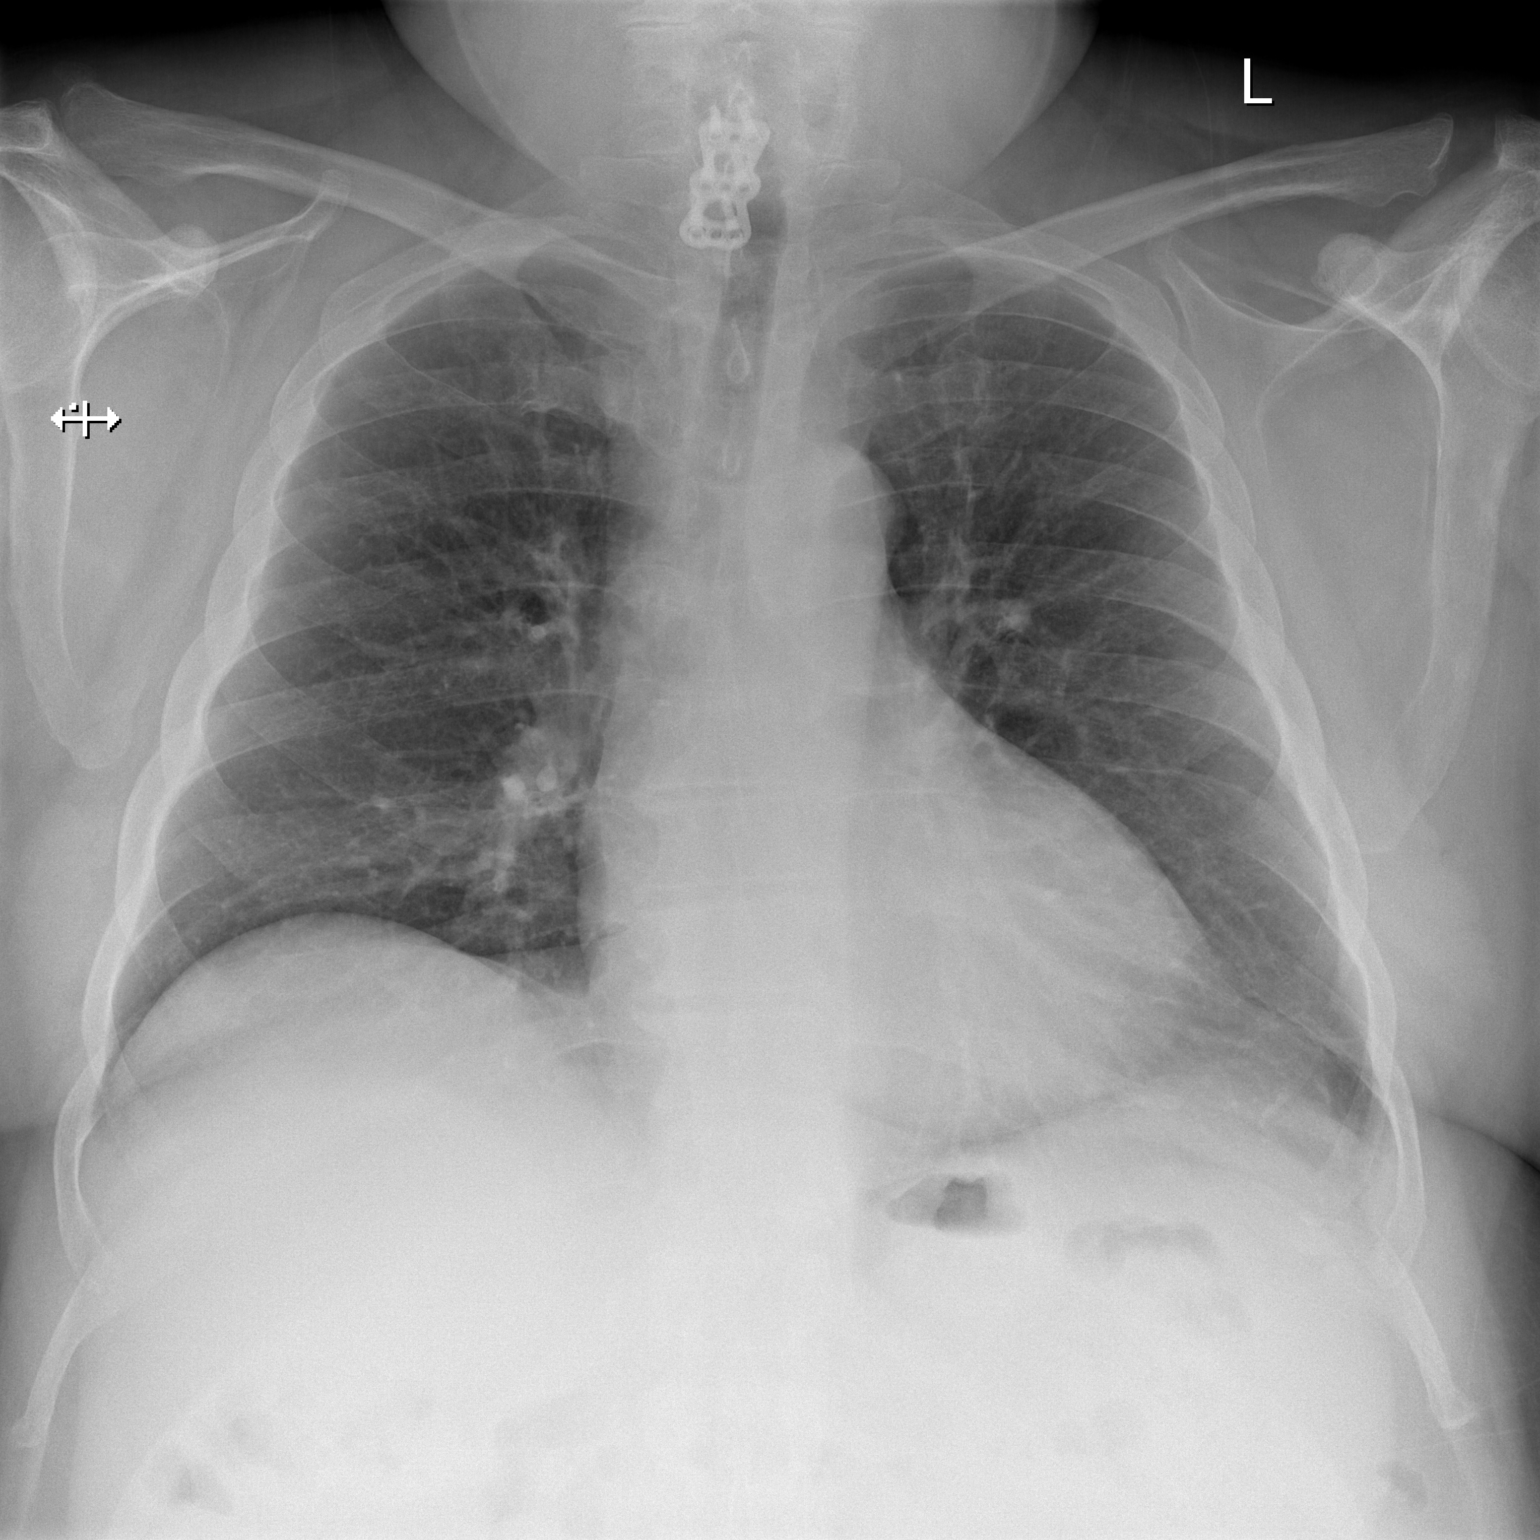

[w chest lat]
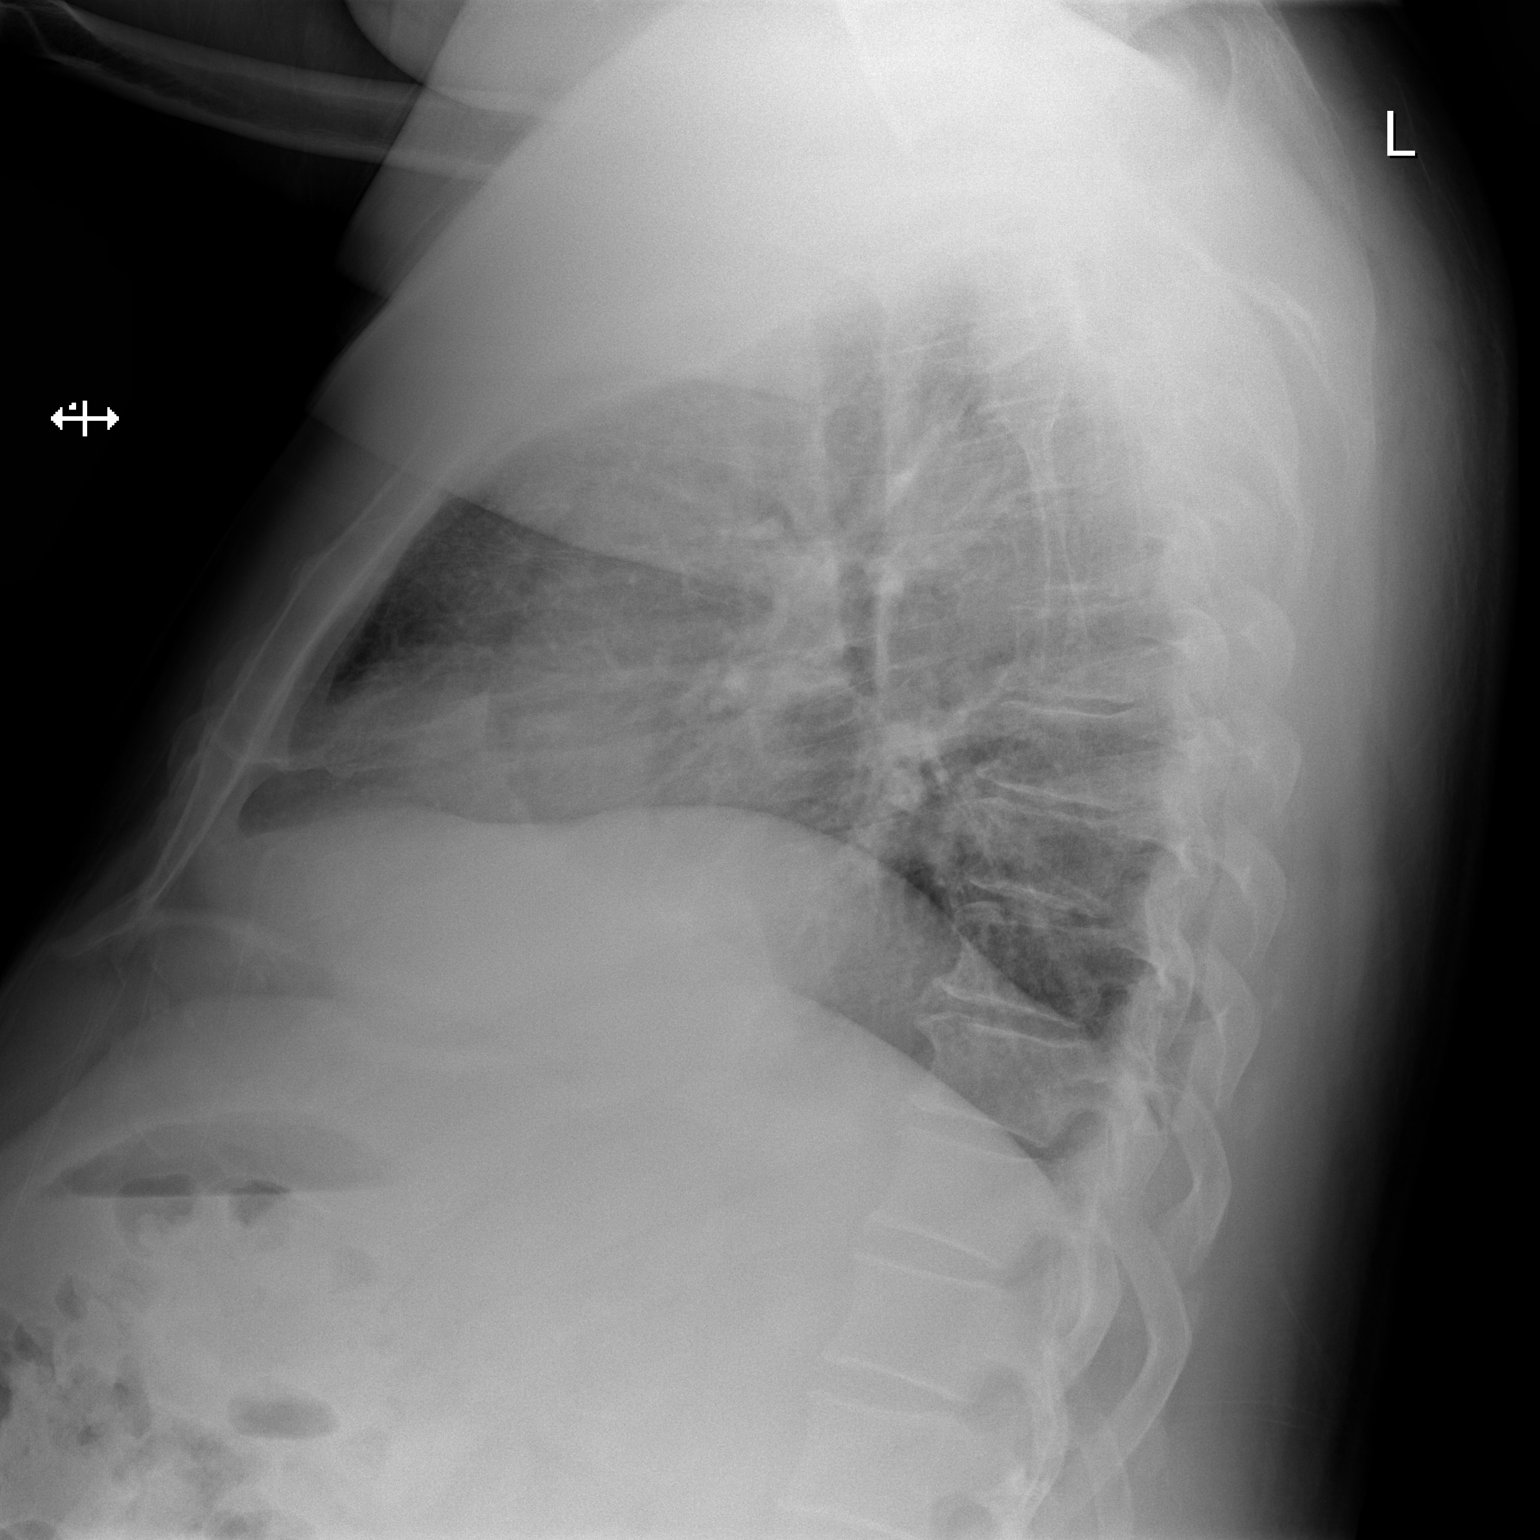

[2 of 2 positions shown; findings below may reference images not displayed]

FINDINGS: Lung volumes are normal. Mild chronic scarring in the lingula,
similar to prior studies. No consolidative airspace disease. No
pleural effusions. No pneumothorax. No pulmonary nodule or mass
noted. Pulmonary vasculature and the cardiomediastinal silhouette
are within normal limits. Orthopedic fixation hardware in the lower
cervical spine.
IMPRESSION: No radiographic evidence of acute cardiopulmonary disease.

## 2021-08-19 IMAGING — RF DG CERVICAL SPINE 2 OR 3 VIEWS
1 series · 2 of 2 positions shown · non-contrast
Comparison: 01/06/2020

CLINICAL DATA: ACDF

EXAM:
CERVICAL SPINE - 2-3 VIEW; DG C-ARM 1-60 MIN
FLUOROSCOPY TIME:  Time: 12.3 seconds
Dose: 4.22 mGy

[Series 1: run · 2 of 2 slices shown]
[im 1/2]
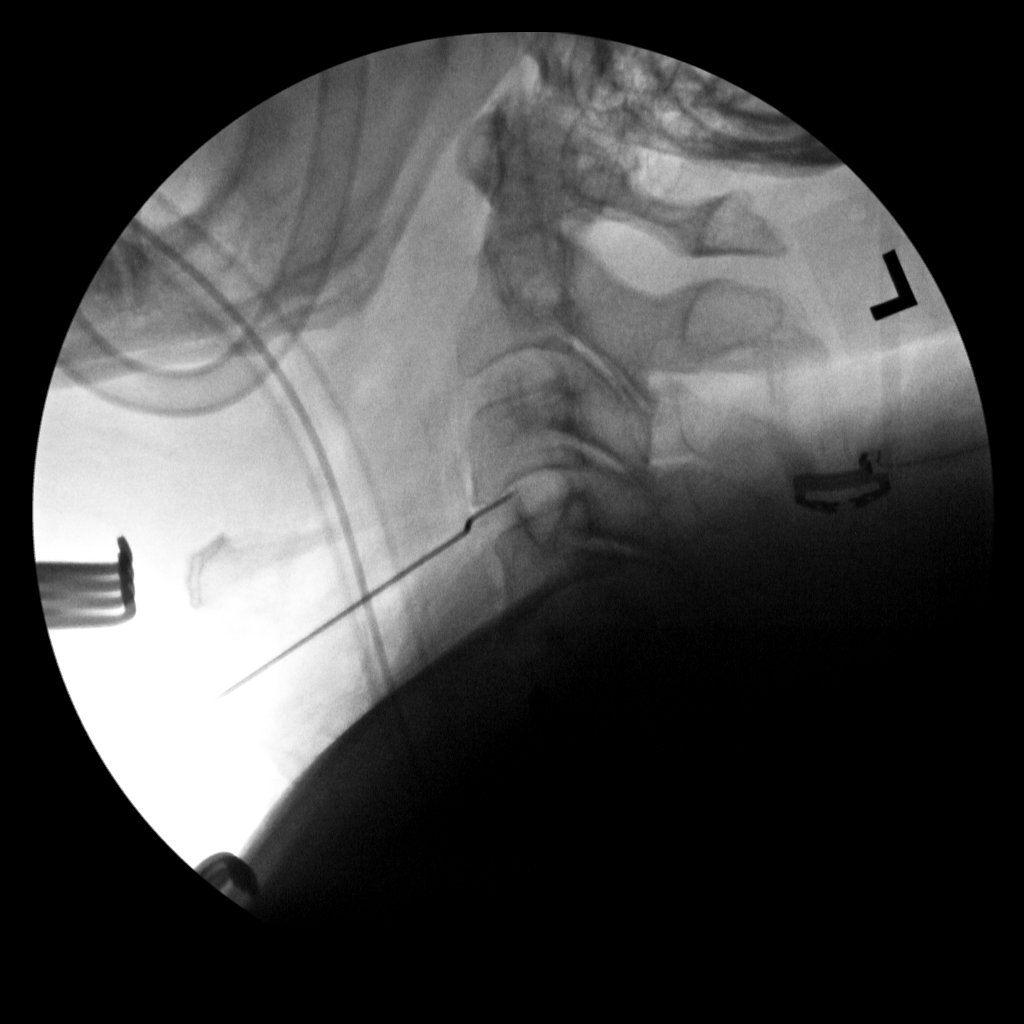
[im 2/2]
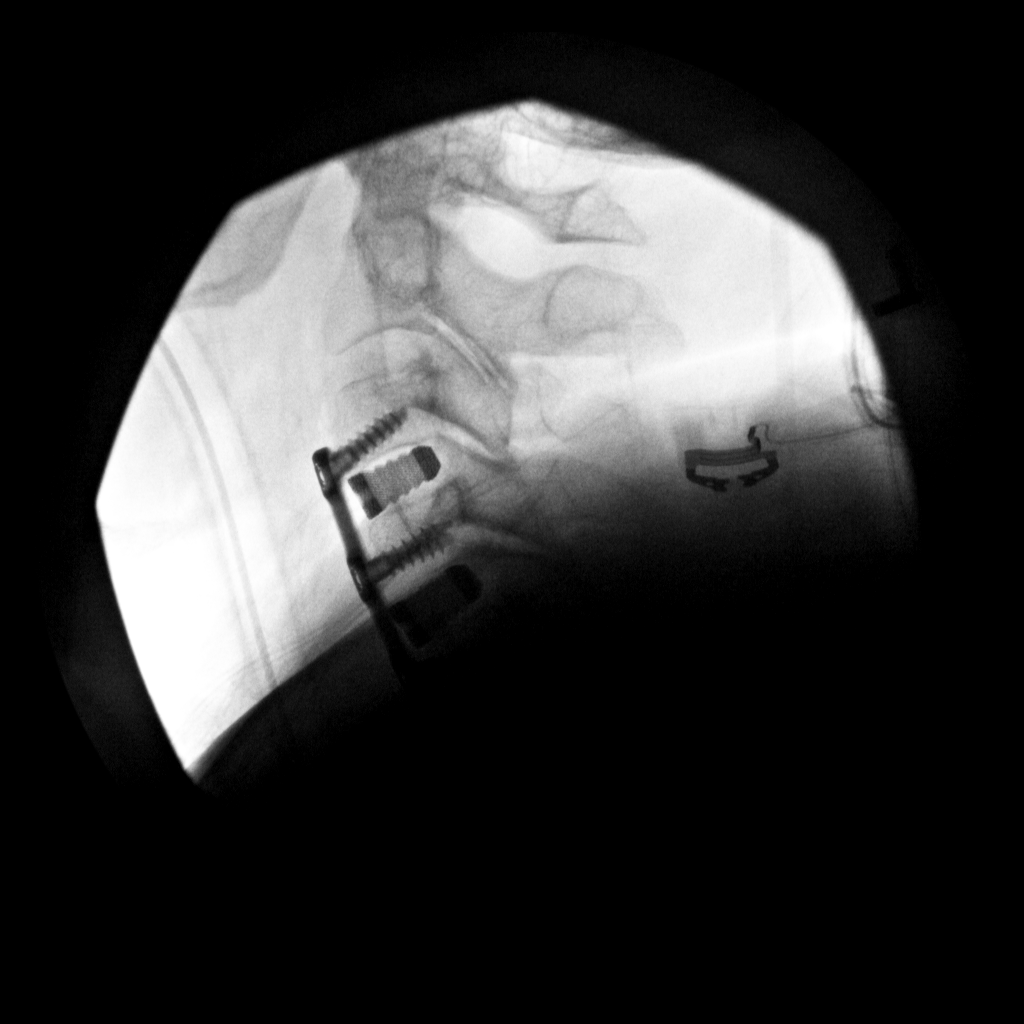

[2 of 2 positions shown; findings below may reference images not displayed]

FINDINGS: On the first image a surgical probe is identified with tip in the
C3-4 disc space. On the second image anterior sideplate and screw
device is noted at C3 through C5. Interbody spacers are identified
within the C3-4 and C4-5 disc space.
IMPRESSION: Status post anterior cervical fusion from C3 through C5.

## 2021-10-20 DIAGNOSIS — R112 Nausea with vomiting, unspecified: Secondary | ICD-10-CM | POA: Diagnosis not present

## 2021-10-20 DIAGNOSIS — R030 Elevated blood-pressure reading, without diagnosis of hypertension: Secondary | ICD-10-CM | POA: Diagnosis not present

## 2021-10-20 DIAGNOSIS — R197 Diarrhea, unspecified: Secondary | ICD-10-CM | POA: Diagnosis not present

## 2021-10-20 DIAGNOSIS — K219 Gastro-esophageal reflux disease without esophagitis: Secondary | ICD-10-CM | POA: Diagnosis not present

## 2021-10-20 DIAGNOSIS — R1013 Epigastric pain: Secondary | ICD-10-CM | POA: Diagnosis not present

## 2021-10-20 DIAGNOSIS — R319 Hematuria, unspecified: Secondary | ICD-10-CM | POA: Diagnosis not present

## 2021-10-25 DIAGNOSIS — D72829 Elevated white blood cell count, unspecified: Secondary | ICD-10-CM | POA: Diagnosis not present

## 2021-10-27 DIAGNOSIS — R109 Unspecified abdominal pain: Secondary | ICD-10-CM | POA: Diagnosis not present

## 2021-10-27 DIAGNOSIS — R300 Dysuria: Secondary | ICD-10-CM | POA: Diagnosis not present

## 2021-10-27 DIAGNOSIS — I10 Essential (primary) hypertension: Secondary | ICD-10-CM | POA: Diagnosis not present

## 2021-10-27 DIAGNOSIS — N202 Calculus of kidney with calculus of ureter: Secondary | ICD-10-CM | POA: Diagnosis not present

## 2021-10-27 DIAGNOSIS — K760 Fatty (change of) liver, not elsewhere classified: Secondary | ICD-10-CM | POA: Diagnosis not present

## 2021-10-27 DIAGNOSIS — K4090 Unilateral inguinal hernia, without obstruction or gangrene, not specified as recurrent: Secondary | ICD-10-CM | POA: Diagnosis not present

## 2021-10-27 DIAGNOSIS — R319 Hematuria, unspecified: Secondary | ICD-10-CM | POA: Diagnosis not present

## 2021-10-27 DIAGNOSIS — N23 Unspecified renal colic: Secondary | ICD-10-CM | POA: Diagnosis not present

## 2021-10-27 DIAGNOSIS — N200 Calculus of kidney: Secondary | ICD-10-CM | POA: Diagnosis not present

## 2021-11-02 DIAGNOSIS — E782 Mixed hyperlipidemia: Secondary | ICD-10-CM | POA: Diagnosis not present

## 2021-11-02 DIAGNOSIS — E1165 Type 2 diabetes mellitus with hyperglycemia: Secondary | ICD-10-CM | POA: Diagnosis not present

## 2021-11-02 DIAGNOSIS — I10 Essential (primary) hypertension: Secondary | ICD-10-CM | POA: Diagnosis not present

## 2021-11-08 DIAGNOSIS — G4733 Obstructive sleep apnea (adult) (pediatric): Secondary | ICD-10-CM | POA: Diagnosis not present

## 2021-11-08 DIAGNOSIS — E1165 Type 2 diabetes mellitus with hyperglycemia: Secondary | ICD-10-CM | POA: Diagnosis not present

## 2021-11-08 DIAGNOSIS — K2100 Gastro-esophageal reflux disease with esophagitis, without bleeding: Secondary | ICD-10-CM | POA: Diagnosis not present

## 2021-11-08 DIAGNOSIS — R4582 Worries: Secondary | ICD-10-CM | POA: Diagnosis not present

## 2021-11-08 DIAGNOSIS — E7849 Other hyperlipidemia: Secondary | ICD-10-CM | POA: Diagnosis not present

## 2021-11-08 DIAGNOSIS — M1009 Idiopathic gout, multiple sites: Secondary | ICD-10-CM | POA: Diagnosis not present

## 2021-11-08 DIAGNOSIS — J301 Allergic rhinitis due to pollen: Secondary | ICD-10-CM | POA: Diagnosis not present

## 2021-11-08 DIAGNOSIS — G5601 Carpal tunnel syndrome, right upper limb: Secondary | ICD-10-CM | POA: Diagnosis not present

## 2021-11-08 DIAGNOSIS — M19011 Primary osteoarthritis, right shoulder: Secondary | ICD-10-CM | POA: Diagnosis not present

## 2021-11-08 DIAGNOSIS — I10 Essential (primary) hypertension: Secondary | ICD-10-CM | POA: Diagnosis not present

## 2021-12-02 DIAGNOSIS — E1165 Type 2 diabetes mellitus with hyperglycemia: Secondary | ICD-10-CM | POA: Diagnosis not present

## 2021-12-02 DIAGNOSIS — I10 Essential (primary) hypertension: Secondary | ICD-10-CM | POA: Diagnosis not present

## 2021-12-02 DIAGNOSIS — E782 Mixed hyperlipidemia: Secondary | ICD-10-CM | POA: Diagnosis not present

## 2022-02-07 ENCOUNTER — Other Ambulatory Visit: Payer: Self-pay

## 2022-02-07 ENCOUNTER — Emergency Department (HOSPITAL_COMMUNITY): Payer: Medicare Other

## 2022-02-07 ENCOUNTER — Emergency Department (HOSPITAL_COMMUNITY)
Admission: EM | Admit: 2022-02-07 | Discharge: 2022-02-07 | Disposition: A | Discharge status: Home / Self Care | Payer: Medicare Other | Attending: Emergency Medicine | Admitting: Emergency Medicine

## 2022-02-07 ENCOUNTER — Encounter (HOSPITAL_COMMUNITY): Payer: Self-pay

## 2022-02-07 DIAGNOSIS — Z7984 Long term (current) use of oral hypoglycemic drugs: Secondary | ICD-10-CM | POA: Diagnosis not present

## 2022-02-07 DIAGNOSIS — E1165 Type 2 diabetes mellitus with hyperglycemia: Secondary | ICD-10-CM | POA: Diagnosis not present

## 2022-02-07 DIAGNOSIS — R0789 Other chest pain: Secondary | ICD-10-CM | POA: Diagnosis not present

## 2022-02-07 DIAGNOSIS — Z79899 Other long term (current) drug therapy: Secondary | ICD-10-CM | POA: Diagnosis not present

## 2022-02-07 DIAGNOSIS — R079 Chest pain, unspecified: Secondary | ICD-10-CM | POA: Diagnosis not present

## 2022-02-07 DIAGNOSIS — R001 Bradycardia, unspecified: Secondary | ICD-10-CM | POA: Insufficient documentation

## 2022-02-07 DIAGNOSIS — I10 Essential (primary) hypertension: Secondary | ICD-10-CM | POA: Diagnosis not present

## 2022-02-07 LAB — HEPATIC FUNCTION PANEL
ALT: 36 U/L (ref 0–44)
AST: 31 U/L (ref 15–41)
Albumin: 4 g/dL (ref 3.5–5.0)
Alkaline Phosphatase: 100 U/L (ref 38–126)
Bilirubin, Direct: 0.2 mg/dL (ref 0.0–0.2)
Indirect Bilirubin: 0.3 mg/dL (ref 0.3–0.9)
Total Bilirubin: 0.5 mg/dL (ref 0.3–1.2)
Total Protein: 6.7 g/dL (ref 6.5–8.1)

## 2022-02-07 LAB — CBC
HCT: 45 % (ref 39.0–52.0)
Hemoglobin: 15.3 g/dL (ref 13.0–17.0)
MCH: 31.2 pg (ref 26.0–34.0)
MCHC: 34 g/dL (ref 30.0–36.0)
MCV: 91.6 fL (ref 80.0–100.0)
Platelets: 159 10*3/uL (ref 150–400)
RBC: 4.91 MIL/uL (ref 4.22–5.81)
RDW: 12.8 % (ref 11.5–15.5)
WBC: 6.4 10*3/uL (ref 4.0–10.5)
nRBC: 0 % (ref 0.0–0.2)

## 2022-02-07 LAB — BASIC METABOLIC PANEL
Anion gap: 9 (ref 5–15)
BUN: 9 mg/dL (ref 6–20)
CO2: 24 mmol/L (ref 22–32)
Calcium: 8.8 mg/dL — ABNORMAL LOW (ref 8.9–10.3)
Chloride: 107 mmol/L (ref 98–111)
Creatinine, Ser: 0.92 mg/dL (ref 0.61–1.24)
GFR, Estimated: >60 mL/min (ref >60)
Glucose, Bld: 135 mg/dL — ABNORMAL HIGH (ref 70–99)
Potassium: 3.9 mmol/L (ref 3.5–5.1)
Sodium: 140 mmol/L (ref 135–145)

## 2022-02-07 LAB — MAGNESIUM: Magnesium: 2 mg/dL (ref 1.7–2.4)

## 2022-02-07 LAB — CBG MONITORING, ED: Glucose-Capillary: 126 mg/dL — ABNORMAL HIGH (ref 70–99)

## 2022-02-07 LAB — TROPONIN I (HIGH SENSITIVITY)
Troponin I (High Sensitivity): 5 ng/L (ref <18)
Troponin I (High Sensitivity): 6 ng/L (ref <18)

## 2022-02-07 LAB — LIPASE, BLOOD: Lipase: 38 U/L (ref 11–51)

## 2022-02-07 MED ORDER — ASPIRIN 81 MG PO CHEW
324.0000 mg | CHEWABLE_TABLET | Freq: Once | ORAL | Status: AC
  Administered 2022-02-07: 324 mg via ORAL
  Filled 2022-02-07: qty 4

## 2022-02-07 MED ORDER — NITROGLYCERIN 2 % TD OINT
1.0000 [in_us] | TOPICAL_OINTMENT | Freq: Once | TRANSDERMAL | Status: AC
  Administered 2022-02-07: 1 [in_us] via TOPICAL
  Filled 2022-02-07: qty 1

## 2022-02-07 NOTE — Discharge Instructions (Signed)
You should receive a call from the cardiologist office to set up a follow-up appointment.  If you do not hear from them by tomorrow, call the number below to set up that appointment.  Return to the emergency department if you do have any return of concerning symptoms.

## 2022-02-07 NOTE — ED Triage Notes (Signed)
Pt presents to ED with complaints of sudden onset of left sided chest pressure started 30 minutes PTA. Pt reports SOB earlier today. HR noted to be 41-51 in triage

## 2022-02-07 NOTE — ED Notes (Signed)
Pt resting. Nad. Second EKG given to edp

## 2022-02-07 NOTE — ED Provider Notes (Signed)
[facility] EMERGENCY DEPARTMENT Provider Note   CSN: [id number] Arrival date & time: 02/07/22  1153     History  Chief Complaint  Patient presents with   Chest Pain    Tommy Hoop Jerion Danger. is a 61 y.o. male.   Chest Pain Patient presents for chest pressure.  Medical history includes HTN, HLD, GERD, DM, arthritis, anxiety, gout, sleep apnea.  Onset was 30 minutes prior to arrival.  At the time, he had changed out of filter on his furnace at home.  He states that he was not exerting himself at the time.  He sat down to eat something.  Prior to eating anything, experienced left-sided chest pressure.  He denies any associated shortness of breath, nausea, or diaphoresis.  His wife, who is at bedside, states that he looked red at the time.  His color has since normalized.  Since onset, pain has improved and is currently 2/10 in severity.  He denies any history of ACS.  Risk factors are listed above.  He is not a smoker.  He has no family history of first-degree relatives with early ACS.     Home Medications Prior to Admission medications   Medication Sig Start Date End Date Taking? Authorizing Provider  acetaminophen (TYLENOL) 650 MG CR tablet Take 650 mg by mouth every 8 (eight) hours as needed for pain.   Yes [provider]  allopurinol (ZYLOPRIM) 300 MG tablet Take 1 tablet (300 mg total) by mouth daily. Patient taking differently: Take 450 mg by mouth daily. 07/01/18  Yes Soyla Dryer, PA-C  ALPRAZolam Duanne Moron) 0.5 MG tablet Take 0.5 mg by mouth 3 (three) times daily as needed for anxiety.   Yes [provider]  amLODipine (NORVASC) 5 MG tablet Take 5 mg by mouth daily.   Yes [provider]  atorvastatin (LIPITOR) 20 MG tablet Take 1 tablet (20 mg total) by mouth daily. 07/01/18  Yes Soyla Dryer, PA-C  gabapentin (NEURONTIN) 300 MG capsule Take 300-600 mg by mouth See admin instructions. Take 600 mg in the morning and 300 mg at night   Yes [provider]  lisinopril (ZESTRIL) 40 MG tablet Take 40 mg by mouth daily.   Yes [provider]  Menthol, Topical Analgesic, (BIOFREEZE EX) Apply 1 application topically daily as needed (pain).   Yes [provider]  metFORMIN (GLUCOPHAGE-XR) 500 MG 24 hr tablet Take 1,000 mg by mouth at bedtime. 12/07/19  Yes [provider]  metoprolol tartrate (LOPRESSOR) 50 MG tablet Take 1 tablet (50 mg total) by mouth 2 (two) times daily. 07/01/18  Yes Soyla Dryer, PA-C  Multiple Vitamin (MULTIVITAMIN WITH MINERALS) TABS tablet Take 1 tablet by mouth daily.   Yes [provider]  Saw Palmetto, Serenoa repens, (SAW PALMETTO PO) Take 2 tablets by mouth daily.   Yes [provider]  sertraline (ZOLOFT) 100 MG tablet Take 100 mg by mouth daily.    Yes [provider]  tamsulosin (FLOMAX) 0.4 MG CAPS capsule Take 1 capsule (0.4 mg total) by mouth daily. 07/01/18  Yes Soyla Dryer, PA-C  methocarbamol (ROBAXIN) 500 MG tablet Take 1 tablet (500 mg total) by mouth every 6 (six) hours as needed for muscle spasms. Patient not taking: Reported on 02/07/2022 06/23/20   Shuford, Olivia Mackie, PA-C  naproxen (NAPROSYN) 500 MG tablet Take 1 tablet (500 mg total) by mouth 2 (two) times daily with a meal. Patient not taking: Reported on 02/07/2022 06/23/20   Jenetta Loges, PA-C  ondansetron (ZOFRAN) 4 MG tablet Take 1 tablet (4 mg total) by mouth every 8 (eight) hours as needed for nausea or vomiting. Patient not taking: Reported on 02/07/2022 06/23/20   Shuford, Olivia Mackie, PA-C  oxyCODONE-acetaminophen (PERCOCET) 5-325 MG tablet Take 1 tablet by mouth every 4 (four) hours as needed (max 6 q). Patient not taking: Reported on 02/07/2022 06/23/20   Shuford, Olivia Mackie, PA-C      Allergies    Kiwi extract and Strawberry extract    Review of Systems   Review of Systems  Cardiovascular:  Positive for chest pain.  All other systems reviewed and are negative.   Physical Exam Updated  Vital Signs BP 120/71   Pulse (!) 48   Temp (!) 97.4 F (36.3 C) (Oral)   Resp 13   Ht 5\' 7"  (1.702 m)   Wt 111.1 kg   SpO2 93%   BMI 38.37 kg/m  Physical Exam Vitals and nursing note reviewed.  Constitutional:      General: He is not in acute distress.    Appearance: He is well-developed. He is obese. He is not ill-appearing, toxic-appearing or diaphoretic.  HENT:     Head: Normocephalic and atraumatic.  Eyes:     Conjunctiva/sclera: Conjunctivae normal.  Cardiovascular:     Rate and Rhythm: Regular rhythm. Bradycardia present.     Heart sounds: No murmur heard. Pulmonary:     Effort: Pulmonary effort is normal. No respiratory distress.     Breath sounds: Normal breath sounds. No decreased breath sounds, wheezing, rhonchi or rales.  Chest:     Chest wall: No tenderness.  Abdominal:     Palpations: Abdomen is soft.     Tenderness: There is no abdominal tenderness.  Musculoskeletal:        General: No swelling.     Cervical back: Normal range of motion and neck supple.     Right lower leg: No edema.     Left lower leg: No edema.  Skin:    General: Skin is warm and dry.     Coloration: Skin is not cyanotic or pale.  Neurological:     General: No focal deficit present.     Mental Status: He is alert and oriented to person, place, and time.  Psychiatric:        Mood and Affect: Mood normal.        Behavior: Behavior normal.     ED Results / Procedures / Treatments   Labs (all labs ordered are listed, but only abnormal results are displayed) Labs Reviewed  BASIC METABOLIC PANEL - Abnormal; Notable for the following components:      Result Value   Glucose, Bld 135 (*)    Calcium 8.8 (*)    All other components within normal limits  CBG MONITORING, ED - Abnormal; Notable for the following components:   Glucose-Capillary 126 (*)    All other components within normal limits  CBC  MAGNESIUM  LIPASE, BLOOD  HEPATIC FUNCTION PANEL  TROPONIN I (HIGH SENSITIVITY)   TROPONIN I (HIGH SENSITIVITY)    EKG EKG Interpretation  Date/Time:  Wednesday February 07 2022 11:59:23 EST Ventricular Rate:  49 PR Interval:  188 QRS Duration: 96 QT Interval:  452 QTC Calculation: 408 R Axis:   29 Text Interpretation: Sinus bradycardia Confirmed by Godfrey Pick [phone]) on 02/07/2022 12:43:29 PM  Radiology DG Chest 2 View  Result Date: 02/07/2022 CLINICAL DATA:  Chest pain. EXAM: CHEST - 2 VIEW COMPARISON:  February 11, 2020.  FINDINGS: The heart size and mediastinal contours are within normal limits. Both lungs are clear. The visualized skeletal structures are unremarkable. IMPRESSION: No active cardiopulmonary disease. Electronically Signed   By: Marijo Conception M.D.   On: 02/07/2022 12:19    Procedures Procedures    Medications Ordered in ED Medications  aspirin chewable tablet 324 mg (324 mg Oral Given 02/07/22 1305)  nitroGLYCERIN (NITROGLYN) 2 % ointment 1 inch (1 inch Topical Given 02/07/22 1304)    ED Course/ Medical Decision Making/ A&P                           Medical Decision Making Amount and/or Complexity of Data Reviewed Labs: ordered. Radiology: ordered.  Risk OTC drugs. Prescription drug management.   This patient presents to the ED for concern of chest pain, this involves an extensive number of treatment options, and is a complaint that carries with it a high risk of complications and morbidity.  The differential diagnosis includes ACS, pericarditis, costochondritis, GERD, anxiety   Co morbidities that complicate the patient evaluation  HTN, HLD, GERD, DM, arthritis, anxiety, gout, sleep apnea   Additional history obtained:  Additional history obtained from patient's wife External records from outside source obtained and reviewed including EMR   Lab Tests:  I Ordered, and personally interpreted labs.  The pertinent results include: Normal hemoglobin, no leukocytosis, normal electrolytes, normal troponin, normal hepatobiliary  enzymes, normal lipase   Imaging Studies ordered:  I ordered imaging studies including chest x-ray I independently visualized and interpreted imaging which showed no acute findings I agree with the radiologist interpretation   Cardiac Monitoring: / EKG:  The patient was maintained on a cardiac monitor.  I personally viewed and interpreted the cardiac monitored which showed an underlying rhythm of: Sinus rhythm   Consultations Obtained:  I requested consultation with the cardiologist, Dr. Dellia Cloud,  and discussed lab and imaging findings as well as pertinent plan - they recommend: Outpatient follow-up for stress test if 2nd trop is normal.   Problem List / ED Course / Critical interventions / Medication management  Patient presents for sudden onset left-sided chest pressure.  This was not in the setting of eating or exerting himself.  Pain improved prior to assessment.  Currently, it is 2/10 in severity.  Vital signs are notable for hypertension and bradycardia.  Per chart review, patient typically has bradycardia in the 50s.  On exam, he is well-appearing.  Breathing is unlabored.  No cardiac murmurs, gallops, or abnormal lung sounds are appreciated.  EKG shows no ST segment changes.  Patient given 324 ASA and NTG ointment.  Laboratory workup was initiated.  Initial troponin was normal.  On reassessment, patient's hypertension has resolved.  He also has resolution of chest pain.  I spoke with cardiologist on-call, Dr. Donnajean Lopes PD, who recommends outpatient follow-up if patient mains asymptomatic and second troponin is also reassuring.  Second troponin is normal.  Patient remained asymptomatic.  He was discharged in stable condition. I ordered medication including ASA and NTG for chest pain Reevaluation of the patient after these medicines showed that the patient resolved I have reviewed the patients home medicines and have made adjustments as needed   Social Determinants of Health:  Has  PCP         Final Clinical Impression(s) / ED Diagnoses Final diagnoses:  Chest pain, unspecified type    Rx / DC Orders ED Discharge Orders  Ordered    Ambulatory referral to Cardiology       Comments: If you have not heard from the Cardiology office within the next 72 hours please call [phone].   02/07/22 1526              Gloris Manchester, MD 02/07/22 1552

## 2022-02-12 ENCOUNTER — Ambulatory Visit: Payer: Medicare Other | Attending: Internal Medicine | Admitting: Internal Medicine

## 2022-02-12 ENCOUNTER — Encounter: Payer: Self-pay | Admitting: Internal Medicine

## 2022-02-12 VITALS — BP 114/70 | HR 56 | Ht 67.0 in | Wt 255.2 lb

## 2022-02-12 DIAGNOSIS — G4733 Obstructive sleep apnea (adult) (pediatric): Secondary | ICD-10-CM | POA: Insufficient documentation

## 2022-02-12 DIAGNOSIS — Z720 Tobacco use: Secondary | ICD-10-CM | POA: Insufficient documentation

## 2022-02-12 DIAGNOSIS — R079 Chest pain, unspecified: Secondary | ICD-10-CM

## 2022-02-12 NOTE — Patient Instructions (Addendum)
Medication Instructions:  Your physician recommends that you continue on your current medications as directed. Please refer to the Current Medication list given to you today.  Labwork: none  Testing/Procedures: Your physician has requested that you have an echocardiogram. Echocardiography is a painless test that uses sound waves to create images of your heart. It provides your doctor with information about the size and shape of your heart and how well your heart's chambers and valves are working. This procedure takes approximately one hour. There are no restrictions for this procedure. Please do NOT wear cologne, perfume, aftershave, or lotions (deodorant is allowed). Please arrive 15 minutes prior to your appointment time. Coronary CTA-see instructions below  Follow-Up: Your physician recommends that you schedule a follow-up appointment in: as needed  Any Other Special Instructions Will Be Listed Below (If Applicable).  If you need a refill on your cardiac medications before your next appointment, please call your pharmacy.    Your cardiac CT will be scheduled at one of the below locations:   [facility] [address] [place], [place] [address] [phone]  If scheduled at [facility], please arrive at the [facility] and Children's Entrance (Entrance C2) of [facility] 30 minutes prior to test start time. You can use the FREE valet parking offered at entrance C (encouraged to control the heart rate for the test)  Proceed to the [facility] Radiology Department (first floor) to check-in and test prep.  All radiology patients and guests should use entrance C2 at [facility], accessed from [facility], even though the hospital's physical address listed is [address].    Please follow these instructions carefully (unless otherwise directed):  Hold all erectile dysfunction medications at least 3 days (72 hrs) prior to test.  (Ie viagra, cialis, sildenafil, tadalafil, etc) We will administer nitroglycerin during this exam.   On the Night Before the Test: Be sure to Drink plenty of water. Do not consume any caffeinated/decaffeinated beverages or chocolate 12 hours prior to your test. Do not take any antihistamines 12 hours prior to your test. If the patient has contrast allergy: No contrast allergy  On the Day of the Test: Drink plenty of water until 1 hour prior to the test. Do not eat any food 1 hour prior to test. You may take your regular medications prior to the test.  Take metoprolol (Lopressor) two hours prior to test.     After the Test: Drink plenty of water. After receiving IV contrast, you may experience a mild flushed feeling. This is normal. On occasion, you may experience a mild rash up to 24 hours after the test. This is not dangerous. If this occurs, you can take Benadryl 25 mg and increase your fluid intake. If you experience trouble breathing, this can be serious. If it is severe call [phone] IMMEDIATELY. If it is mild, please call our office.  We will call to schedule your test 2-4 weeks out understanding that some insurance companies will need an authorization prior to the service being performed.   For non-scheduling related questions, please contact the cardiac imaging nurse navigator should you have any questions/concerns: Rockwell Alexandria, Cardiac Imaging Nurse Navigator Larey Brick, Cardiac Imaging Nurse Navigator [facility] Direct Office Dial: [phone]   For scheduling needs, including cancellations and rescheduling, please call [place], [phone].

## 2022-02-12 NOTE — Progress Notes (Signed)
Cardiology Office Note  Date: 02/12/2022   ID: Tommy Right., DOB [birth year], MRN [id number]  PCP:  Richardean Chimera, MD  Cardiologist:  Marjo Bicker, MD Electrophysiologist:  None   Reason for Office Visit: Evaluation chest pain at the request of Dr. Durwin Nora   History of Present Illness: Tommy Horton. is a 61 y.o. male known to have HTN, DM 2, OSA on CPAP therapy was referred to cardiology clinic for evaluation chest pressure after ER visit on 02/07/22.  Patient had one-time episode of substernal chest pressure at rest, lasted for 30 minutes, and resolve continuously. Blood pressure during that time was 220/110 mmHg. Patient said his wife did something that irked him and let him to the chest pressure event with elevated blood pressure.  Denied having this kind of chest pressure episode in the past.  He subsequently went to the ER to get checked out.  Troponins were within normal limits and EKG showed sinus bradycardia with no ST changes. He was subsequently discharged from the ER to be seen in the cardiology clinic. He was diagnosed with severe sleep apnea but does not use CPAP.  Last use of CPAP was 1 year ago.  He dips every day, has occasional alcohol use and denied illicit drugs.  He is not much active at home but perform daily activities with no issues.  Past Medical History:  Diagnosis Date   Anxiety    Arthritis    Bladder stone    Depression    Diabetes mellitus without complication (HCC)    GERD (gastroesophageal reflux disease)    Gout    History of kidney stones    Hypertension    Sleep apnea    CPAP    Past Surgical History:  Procedure Laterality Date   ANTERIOR CERVICAL DECOMP/DISCECTOMY FUSION N/A 02/15/2020   Procedure: Anterior Cervical Discectomy and Fusion Cervical Three-Cervical Four/Cervical Four-Cervical Five with Hardware Removal;  Surgeon: Tia Alert, MD;  Location: [facility] OR;  Service: Neurosurgery;  Laterality: N/A;  3C   BICEPS  TENDON REPAIR Left    CERVICAL FUSION     HERNIA REPAIR Right    inguinal   LITHOTRIPSY     REVERSE SHOULDER ARTHROPLASTY Right 06/23/2020   Procedure: REVERSE SHOULDER ARTHROPLASTY;  Surgeon: Francena Hanly, MD;  Location: [facility];  Service: Orthopedics;  Laterality: Right;    SHOULDER ARTHROSCOPY WITH ROTATOR CUFF REPAIR AND SUBACROMIAL DECOMPRESSION Left 07/29/2012   Procedure: LEFT SHOULDER ARTHROSCOPY WITH SUBACROMIAL DECOMPRESSION, DISTAL CLAVICLE RESECTION/ROTATOR CUFF REPAIR ;  Surgeon: Senaida Lange, MD;  Location: [facility] OR;  Service: Orthopedics;  Laterality: Left;   SHOULDER SURGERY     UMBILICAL HERNIA REPAIR      Current Outpatient Medications  Medication Sig Dispense Refill   acetaminophen (TYLENOL) 650 MG CR tablet Take 650 mg by mouth every 8 (eight) hours as needed for pain.     allopurinol (ZYLOPRIM) 300 MG tablet Take 1 tablet (300 mg total) by mouth daily. (Patient taking differently: Take 450 mg by mouth daily.) 30 tablet 1   ALPRAZolam (XANAX) 0.5 MG tablet Take 0.5 mg by mouth 3 (three) times daily as needed for anxiety.     amLODipine (NORVASC) 5 MG tablet Take 5 mg by mouth daily.     atorvastatin (LIPITOR) 20 MG tablet Take 1 tablet (20 mg total) by mouth daily. 30 tablet 1   famotidine (PEPCID) 40 MG tablet Take 40 mg by mouth daily.  gabapentin (NEURONTIN) 300 MG capsule Take 300-600 mg by mouth See admin instructions. Take 600 mg in the morning and 300 mg at night     lisinopril (ZESTRIL) 40 MG tablet Take 40 mg by mouth daily.     Menthol, Topical Analgesic, (BIOFREEZE EX) Apply 1 application topically daily as needed (pain).     metFORMIN (GLUCOPHAGE-XR) 500 MG 24 hr tablet Take 1,000 mg by mouth at bedtime.     metoprolol tartrate (LOPRESSOR) 50 MG tablet Take 1 tablet (50 mg total) by mouth 2 (two) times daily. 60 tablet 1   ondansetron (ZOFRAN) 4 MG tablet Take 1 tablet (4 mg total) by mouth every 8 (eight) hours as needed for nausea or vomiting. 10  tablet 0   oxyCODONE-acetaminophen (PERCOCET) 5-325 MG tablet Take 1 tablet by mouth every 4 (four) hours as needed (max 6 q). 20 tablet 0   pantoprazole (PROTONIX) 40 MG tablet Take 40 mg by mouth daily.     sertraline (ZOLOFT) 100 MG tablet Take 100 mg by mouth daily.      tamsulosin (FLOMAX) 0.4 MG CAPS capsule Take 1 capsule (0.4 mg total) by mouth daily. 30 capsule 1   No current facility-administered medications for this visit.   Allergies:  Kiwi extract and Strawberry extract   Social History: The patient  reports that he has never smoked. His smokeless tobacco use includes snuff. He reports current alcohol use of about 3.0 - 4.0 standard drinks of alcohol per week. He reports that he does not currently use drugs after having used the following drugs: "Crack" cocaine and Marijuana.   Family History: The patient's family history includes Lung disease in his father; Suicidality in his mother.   ROS:  Please see the history of present illness. Otherwise, complete review of systems is positive for none.  All other systems are reviewed and negative.   Physical Exam: VS:  BP 114/70   Pulse (!) 56   Ht 5\' 7"  (1.702 m)   Wt 255 lb 3.2 oz (115.8 kg)   SpO2 95%   BMI 39.97 kg/m , BMI Body mass index is 39.97 kg/m.  Wt Readings from Last 3 Encounters:  02/12/22 255 lb 3.2 oz (115.8 kg)  02/07/22 245 lb (111.1 kg)  04/10/21 250 lb (113.4 kg)    General: Patient appears comfortable at rest. HEENT: Conjunctiva and lids normal, oropharynx clear with moist mucosa. Neck: Supple, no elevated JVP or carotid bruits, no thyromegaly. Lungs: Clear to auscultation, nonlabored breathing at rest. Cardiac: Regular rate and rhythm, no S3 or significant systolic murmur, no pericardial rub. Abdomen: Soft, nontender, no hepatomegaly, bowel sounds present, no guarding or rebound. Extremities: No pitting edema, distal pulses 2+. Skin: Warm and dry. Musculoskeletal: No kyphosis. Neuropsychiatric: Alert  and oriented x3, affect grossly appropriate.  ECG: Normal sinus rhythm and no ST-T changes  Recent Labwork: 04/10/2021: TSH 1.430 02/07/2022: ALT 36; AST 31; BUN 9; Creatinine, Ser 0.92; Hemoglobin 15.3; Magnesium 2.0; Platelets 159; Potassium 3.9; Sodium 140     Component Value Date/Time   CHOL 137 04/15/2018 1049   TRIG 136 04/15/2018 1049   HDL 39 (L) 04/15/2018 1049   CHOLHDL 3.5 04/15/2018 1049   VLDL 27 04/15/2018 1049   LDLCALC 71 04/15/2018 1049    Other Studies Reviewed Today: I reviewed the ER notes from 02/07/2022 for chest pain visit.  Assessment and Plan: Patient is a 61 year old M known to have HTN, DM 2, OSA on CPAP was referred to cardiology clinic  for evaluation of chest pain (post ER visit).  # Chest pain -Patient had one-time episode of resting substernal chest pressure lasting for 30 minutes associated with elevated blood pressure reading of 220/110 mmHg. His wife irked him leading to the chest pressure episode. I will obtain CTA cardiac to rule out any CAD due to DM2 diagnosis. -Obtain 2D echocardiogram to rule out RWMA.  # HTN, controlled -Continue amlodipine 5 mg once daily, lisinopril 40 mg once daily and metoprolol tartrate 50 mg twice daily.  HTN management per PCP.  # HLD, at goal -Continue atorvastatin 20 mg nightly.  Goal LDL less than 100.  HLD management per PCP.  # OSA not on CPAP -Patient currently not on CPAP.  Last use of CPAP was 1 year ago.  Encouraged compliance to CPAP therapy.  I discussed the risk of not being on CPAP which include atrial arrhythmias, HFpEF and HTN.  # Nicotine abuse -Patient dips every day (although he says occasionally). Dip cessation instruction/counseling given:  Counseled patient on the dangers of tobacco use, advised patient to stop dipping and reviewed strategies to maximize success.  I have spent a total of 45 minutes with patient reviewing chart,EKGs, labs and examining patient as well as establishing an assessment  and plan that was discussed with the patient.  > 50% of time was spent in direct patient care.  '    Medication Adjustments/Labs and Tests Ordered: Current medicines are reviewed at length with the patient today.  Concerns regarding medicines are outlined above.   Tests Ordered: No orders of the defined types were placed in this encounter.   Medication Changes: No orders of the defined types were placed in this encounter.   Disposition:  Follow up prn  Signed Amandamarie Feggins Fidel Levy, MD, 02/12/2022 11:31 AM    [facility] [place], [place], [place] [address]

## 2022-02-15 DIAGNOSIS — Z1329 Encounter for screening for other suspected endocrine disorder: Secondary | ICD-10-CM | POA: Diagnosis not present

## 2022-02-15 DIAGNOSIS — R5381 Other malaise: Secondary | ICD-10-CM | POA: Diagnosis not present

## 2022-02-15 DIAGNOSIS — K2100 Gastro-esophageal reflux disease with esophagitis, without bleeding: Secondary | ICD-10-CM | POA: Diagnosis not present

## 2022-02-15 DIAGNOSIS — E7849 Other hyperlipidemia: Secondary | ICD-10-CM | POA: Diagnosis not present

## 2022-02-15 DIAGNOSIS — E1165 Type 2 diabetes mellitus with hyperglycemia: Secondary | ICD-10-CM | POA: Diagnosis not present

## 2022-02-15 DIAGNOSIS — I10 Essential (primary) hypertension: Secondary | ICD-10-CM | POA: Diagnosis not present

## 2022-02-19 ENCOUNTER — Telehealth (HOSPITAL_COMMUNITY): Payer: Self-pay | Admitting: Emergency Medicine

## 2022-02-19 NOTE — Telephone Encounter (Signed)
Reaching out to patient to offer assistance regarding upcoming cardiac imaging study; pt verbalizes understanding of appt date/time, parking situation and where to check in, pre-test NPO status and medications ordered, and verified current allergies; name and call back number provided for further questions should they arise Rockwell Alexandria RN Navigator Cardiac Imaging [facility] [phone] office [phone] cell  Arrival 1230 Holding metformin  Otherwise daily meds HR 56 on metop BID

## 2022-02-20 DIAGNOSIS — M19011 Primary osteoarthritis, right shoulder: Secondary | ICD-10-CM | POA: Diagnosis not present

## 2022-02-20 DIAGNOSIS — K2100 Gastro-esophageal reflux disease with esophagitis, without bleeding: Secondary | ICD-10-CM | POA: Diagnosis not present

## 2022-02-20 DIAGNOSIS — E7849 Other hyperlipidemia: Secondary | ICD-10-CM | POA: Diagnosis not present

## 2022-02-20 DIAGNOSIS — E782 Mixed hyperlipidemia: Secondary | ICD-10-CM | POA: Diagnosis not present

## 2022-02-20 DIAGNOSIS — G5601 Carpal tunnel syndrome, right upper limb: Secondary | ICD-10-CM | POA: Diagnosis not present

## 2022-02-20 DIAGNOSIS — I10 Essential (primary) hypertension: Secondary | ICD-10-CM | POA: Diagnosis not present

## 2022-02-20 DIAGNOSIS — M1009 Idiopathic gout, multiple sites: Secondary | ICD-10-CM | POA: Diagnosis not present

## 2022-02-20 DIAGNOSIS — Z0001 Encounter for general adult medical examination with abnormal findings: Secondary | ICD-10-CM | POA: Diagnosis not present

## 2022-02-20 DIAGNOSIS — G4733 Obstructive sleep apnea (adult) (pediatric): Secondary | ICD-10-CM | POA: Diagnosis not present

## 2022-02-20 DIAGNOSIS — Z23 Encounter for immunization: Secondary | ICD-10-CM | POA: Diagnosis not present

## 2022-02-20 DIAGNOSIS — J301 Allergic rhinitis due to pollen: Secondary | ICD-10-CM | POA: Diagnosis not present

## 2022-02-20 DIAGNOSIS — Z1389 Encounter for screening for other disorder: Secondary | ICD-10-CM | POA: Diagnosis not present

## 2022-02-20 DIAGNOSIS — E1165 Type 2 diabetes mellitus with hyperglycemia: Secondary | ICD-10-CM | POA: Diagnosis not present

## 2022-02-21 ENCOUNTER — Ambulatory Visit (HOSPITAL_COMMUNITY)
Admission: RE | Admit: 2022-02-21 | Discharge: 2022-02-21 | Disposition: A | Discharge status: Home / Self Care | Payer: Medicare Other | Source: Ambulatory Visit | Attending: Internal Medicine | Admitting: Internal Medicine

## 2022-02-21 ENCOUNTER — Other Ambulatory Visit: Payer: Self-pay | Admitting: Cardiology

## 2022-02-21 DIAGNOSIS — R079 Chest pain, unspecified: Secondary | ICD-10-CM | POA: Insufficient documentation

## 2022-02-21 DIAGNOSIS — I2510 Atherosclerotic heart disease of native coronary artery without angina pectoris: Secondary | ICD-10-CM | POA: Diagnosis not present

## 2022-02-21 DIAGNOSIS — R931 Abnormal findings on diagnostic imaging of heart and coronary circulation: Secondary | ICD-10-CM

## 2022-02-21 MED ORDER — NITROGLYCERIN 0.4 MG SL SUBL
SUBLINGUAL_TABLET | SUBLINGUAL | Status: AC
  Filled 2022-02-21: qty 2

## 2022-02-21 MED ORDER — NITROGLYCERIN 0.4 MG SL SUBL
0.8000 mg | SUBLINGUAL_TABLET | Freq: Once | SUBLINGUAL | Status: AC
  Administered 2022-02-21: 0.8 mg via SUBLINGUAL

## 2022-02-21 MED ORDER — IOHEXOL 350 MG/ML SOLN
95.0000 mL | Freq: Once | INTRAVENOUS | Status: AC | PRN
  Administered 2022-02-21: 95 mL via INTRAVENOUS

## 2022-02-22 ENCOUNTER — Ambulatory Visit (HOSPITAL_BASED_OUTPATIENT_CLINIC_OR_DEPARTMENT_OTHER)
Admission: RE | Admit: 2022-02-22 | Discharge: 2022-02-22 | Disposition: A | Discharge status: Home / Self Care | Payer: Medicare Other | Source: Ambulatory Visit | Attending: Cardiology | Admitting: Cardiology

## 2022-02-22 DIAGNOSIS — I2510 Atherosclerotic heart disease of native coronary artery without angina pectoris: Secondary | ICD-10-CM | POA: Diagnosis not present

## 2022-02-22 DIAGNOSIS — R931 Abnormal findings on diagnostic imaging of heart and coronary circulation: Secondary | ICD-10-CM

## 2022-02-22 DIAGNOSIS — R079 Chest pain, unspecified: Secondary | ICD-10-CM | POA: Diagnosis not present

## 2022-02-28 ENCOUNTER — Ambulatory Visit: Payer: Medicare Other | Attending: Internal Medicine

## 2022-02-28 DIAGNOSIS — R079 Chest pain, unspecified: Secondary | ICD-10-CM | POA: Diagnosis not present

## 2022-02-28 LAB — ECHOCARDIOGRAM COMPLETE
AR max vel: 2.57 cm2
AV Area VTI: 2.88 cm2
AV Area mean vel: 2.95 cm2
AV Mean grad: 3.1 mmHg
AV Peak grad: 6.7 mmHg
Ao pk vel: 1.29 m/s
Area-P 1/2: 4.96 cm2
Calc EF: 61.9 %
MV M vel: 2.19 m/s
MV Peak grad: 19.2 mmHg
S' Lateral: 3.4 cm
Single Plane A2C EF: 64.9 %
Single Plane A4C EF: 61 %

## 2022-03-02 ENCOUNTER — Telehealth: Payer: Self-pay | Admitting: *Deleted

## 2022-03-02 MED ORDER — NITROGLYCERIN 0.4 MG SL SUBL: 0.4000 mg | SUBLINGUAL_TABLET | SUBLINGUAL | 3 refills | Status: AC | PRN

## 2022-03-02 MED ORDER — ASPIRIN 81 MG PO TBEC: 81.0000 mg | DELAYED_RELEASE_TABLET | Freq: Every day | ORAL | 3 refills | Status: AC

## 2022-03-02 NOTE — Telephone Encounter (Signed)
-----   Message from Marjo Bicker, MD sent at 03/01/2022  4:22 PM EST ----- Coronary calcium score is mildly elevated at 51. Moderate CAD. Start aspirin 81 mg once daily and continue atorvastatin 20 mg QHS. Schedule cardiology clinic follow up in 6 months and SL NTG 0.4 mg PRN if any chest pains.

## 2022-03-02 NOTE — Telephone Encounter (Signed)
-----   Message from Marjo Bicker, MD sent at 03/01/2022  4:19 PM EST ----- Normal pumping function of the heart and no valve abnormalities.

## 2022-03-02 NOTE — Telephone Encounter (Signed)
Patient informed and verbalized understanding of plan. Copy sent to PCP 

## 2022-03-30 ENCOUNTER — Other Ambulatory Visit: Payer: Self-pay | Admitting: Urology

## 2022-03-30 DIAGNOSIS — N200 Calculus of kidney: Secondary | ICD-10-CM | POA: Diagnosis not present

## 2022-04-05 ENCOUNTER — Encounter (HOSPITAL_BASED_OUTPATIENT_CLINIC_OR_DEPARTMENT_OTHER): Payer: Self-pay | Admitting: Urology

## 2022-04-05 DIAGNOSIS — E119 Type 2 diabetes mellitus without complications: Secondary | ICD-10-CM | POA: Diagnosis not present

## 2022-04-05 DIAGNOSIS — H2513 Age-related nuclear cataract, bilateral: Secondary | ICD-10-CM | POA: Diagnosis not present

## 2022-04-05 DIAGNOSIS — Z7984 Long term (current) use of oral hypoglycemic drugs: Secondary | ICD-10-CM | POA: Diagnosis not present

## 2022-04-05 NOTE — Progress Notes (Signed)
Spoke with pt about upcoming ESWL 04/09/22. Reviewed arrival time of 0800, NPO after midnight, except sip of water with meds. Reviewed allergies, home medications, and history. Pt to stop ASA today, NSAIDs, and supplements per protocol. Pt has not taken nitroglycerin, no chest pain or palpitations. Pt has OSA, states does not use CPAP as prescribed; agrees to bring machine day of procedure. Pt to hold alcohol and marijuana for 24 hours prior. Pt to bring blue folder and license/insurance day of. Reviewed pre-op instructions per [facility] guidelines. Pt verbalized understanding and states no further questions. Driver secured.

## 2022-04-09 ENCOUNTER — Encounter (HOSPITAL_BASED_OUTPATIENT_CLINIC_OR_DEPARTMENT_OTHER)
Admission: RE | Disposition: A | Discharge status: Home / Self Care | Payer: Self-pay | Source: Home / Self Care | Attending: Urology

## 2022-04-09 ENCOUNTER — Other Ambulatory Visit: Payer: Self-pay

## 2022-04-09 ENCOUNTER — Encounter (HOSPITAL_BASED_OUTPATIENT_CLINIC_OR_DEPARTMENT_OTHER): Payer: Self-pay | Admitting: Urology

## 2022-04-09 ENCOUNTER — Ambulatory Visit (HOSPITAL_COMMUNITY): Payer: 59

## 2022-04-09 ENCOUNTER — Ambulatory Visit (HOSPITAL_BASED_OUTPATIENT_CLINIC_OR_DEPARTMENT_OTHER)
Admission: RE | Admit: 2022-04-09 | Discharge: 2022-04-09 | Disposition: A | Discharge status: Home / Self Care | Payer: 59 | Attending: Urology | Admitting: Urology

## 2022-04-09 DIAGNOSIS — Z87442 Personal history of urinary calculi: Secondary | ICD-10-CM | POA: Diagnosis not present

## 2022-04-09 DIAGNOSIS — E119 Type 2 diabetes mellitus without complications: Secondary | ICD-10-CM | POA: Insufficient documentation

## 2022-04-09 DIAGNOSIS — F32A Depression, unspecified: Secondary | ICD-10-CM | POA: Insufficient documentation

## 2022-04-09 DIAGNOSIS — Z79899 Other long term (current) drug therapy: Secondary | ICD-10-CM | POA: Insufficient documentation

## 2022-04-09 DIAGNOSIS — I10 Essential (primary) hypertension: Secondary | ICD-10-CM | POA: Insufficient documentation

## 2022-04-09 DIAGNOSIS — N200 Calculus of kidney: Secondary | ICD-10-CM | POA: Diagnosis not present

## 2022-04-09 DIAGNOSIS — F419 Anxiety disorder, unspecified: Secondary | ICD-10-CM | POA: Diagnosis not present

## 2022-04-09 DIAGNOSIS — K219 Gastro-esophageal reflux disease without esophagitis: Secondary | ICD-10-CM | POA: Diagnosis not present

## 2022-04-09 DIAGNOSIS — Z01818 Encounter for other preprocedural examination: Secondary | ICD-10-CM

## 2022-04-09 DIAGNOSIS — Z7984 Long term (current) use of oral hypoglycemic drugs: Secondary | ICD-10-CM | POA: Diagnosis not present

## 2022-04-09 HISTORY — PX: EXTRACORPOREAL SHOCK WAVE LITHOTRIPSY: SHX1557

## 2022-04-09 LAB — GLUCOSE, CAPILLARY
Glucose-Capillary: 122 mg/dL — ABNORMAL HIGH (ref 70–99)
Glucose-Capillary: 118 mg/dL — ABNORMAL HIGH (ref 70–99)

## 2022-04-09 SURGERY — LITHOTRIPSY, ESWL
Anesthesia: LOCAL | Laterality: Left

## 2022-04-09 MED ORDER — CIPROFLOXACIN HCL 500 MG PO TABS
500.0000 mg | ORAL_TABLET | ORAL | Status: AC
  Administered 2022-04-09: 500 mg via ORAL

## 2022-04-09 MED ORDER — DIPHENHYDRAMINE HCL 25 MG PO CAPS
ORAL_CAPSULE | ORAL | Status: AC
  Filled 2022-04-09: qty 1

## 2022-04-09 MED ORDER — DIAZEPAM 5 MG PO TABS
10.0000 mg | ORAL_TABLET | ORAL | Status: AC
  Administered 2022-04-09: 10 mg via ORAL

## 2022-04-09 MED ORDER — DIPHENHYDRAMINE HCL 25 MG PO CAPS
25.0000 mg | ORAL_CAPSULE | ORAL | Status: AC
  Administered 2022-04-09: 25 mg via ORAL

## 2022-04-09 MED ORDER — OXYCODONE-ACETAMINOPHEN 5-325 MG PO TABS: 1.0000 | ORAL_TABLET | Freq: Four times a day (QID) | ORAL | 0 refills | Status: AC | PRN

## 2022-04-09 MED ORDER — CIPROFLOXACIN HCL 500 MG PO TABS
ORAL_TABLET | ORAL | Status: AC
  Filled 2022-04-09: qty 1

## 2022-04-09 MED ORDER — SODIUM CHLORIDE 0.9 % IV SOLN: INTRAVENOUS | Status: DC

## 2022-04-09 MED ORDER — DIAZEPAM 5 MG PO TABS
ORAL_TABLET | ORAL | Status: AC
  Filled 2022-04-09: qty 2

## 2022-04-09 NOTE — H&P (Signed)
H&P  History of Present Illness: Moosa Bueche. is a 62 y.o. year old M who presents today for Left ESWL to treat a left renal stone. No acute complaints  Past Medical History:  Diagnosis Date   Anxiety    Arthritis    Bladder stone    Depression    Diabetes mellitus without complication (HCC)    GERD (gastroesophageal reflux disease)    Gout    History of kidney stones    Hypertension    Sleep apnea    CPAP    Past Surgical History:  Procedure Laterality Date   ANTERIOR CERVICAL DECOMP/DISCECTOMY FUSION N/A 02/15/2020   Procedure: Anterior Cervical Discectomy and Fusion Cervical Three-Cervical Four/Cervical Four-Cervical Five with Hardware Removal;  Surgeon: Eustace Moore, MD;  Location: [facility];  Service: Neurosurgery;  Laterality: N/A;  3C   BICEPS TENDON REPAIR Left    CERVICAL FUSION     HERNIA REPAIR Right    inguinal   LITHOTRIPSY     REVERSE SHOULDER ARTHROPLASTY Right 06/23/2020   Procedure: REVERSE SHOULDER ARTHROPLASTY;  Surgeon: Justice Britain, MD;  Location: [facility];  Service: Orthopedics;  Laterality: Right;  154min   SHOULDER ARTHROSCOPY WITH ROTATOR CUFF REPAIR AND SUBACROMIAL DECOMPRESSION Left 07/29/2012   Procedure: LEFT SHOULDER ARTHROSCOPY WITH SUBACROMIAL DECOMPRESSION, DISTAL CLAVICLE RESECTION/ROTATOR CUFF REPAIR ;  Surgeon: Marin Shutter, MD;  Location: [facility];  Service: Orthopedics;  Laterality: Left;   SHOULDER SURGERY     UMBILICAL HERNIA REPAIR      Home Medications:  Current Meds  Medication Sig   allopurinol (ZYLOPRIM) 300 MG tablet Take 1 tablet (300 mg total) by mouth daily. (Patient taking differently: Take 450 mg by mouth daily.)   ALPRAZolam (XANAX) 0.5 MG tablet Take 0.5 mg by mouth 3 (three) times daily as needed for anxiety.   amLODipine (NORVASC) 5 MG tablet Take 5 mg by mouth daily.   aspirin EC 81 MG tablet Take 1 tablet (81 mg total) by mouth daily. Swallow whole.   atorvastatin (LIPITOR) 20 MG tablet Take 1 tablet (20 mg total) by  mouth daily.   famotidine (PEPCID) 40 MG tablet Take 40 mg by mouth daily.   gabapentin (NEURONTIN) 300 MG capsule Take 300-600 mg by mouth See admin instructions. Take 600 mg in the morning and 300 mg at night   lisinopril (ZESTRIL) 40 MG tablet Take 40 mg by mouth daily.   Menthol, Topical Analgesic, (BIOFREEZE EX) Apply 1 application topically daily as needed (pain).   metFORMIN (GLUCOPHAGE-XR) 500 MG 24 hr tablet Take 1,000 mg by mouth at bedtime.   metoprolol tartrate (LOPRESSOR) 50 MG tablet Take 1 tablet (50 mg total) by mouth 2 (two) times daily.   NON FORMULARY 1 tablet daily. Super Beta Prostate   ondansetron (ZOFRAN) 4 MG tablet Take 1 tablet (4 mg total) by mouth every 8 (eight) hours as needed for nausea or vomiting.   oxyCODONE-acetaminophen (PERCOCET) 5-325 MG tablet Take 1 tablet by mouth every 4 (four) hours as needed (max 6 q).   pantoprazole (PROTONIX) 40 MG tablet Take 40 mg by mouth daily.   sertraline (ZOLOFT) 100 MG tablet Take 100 mg by mouth daily.    tamsulosin (FLOMAX) 0.4 MG CAPS capsule Take 1 capsule (0.4 mg total) by mouth daily.    Allergies:  Allergies  Allergen Reactions   Kiwi Extract Swelling    Tongue swelling   Strawberry Extract Swelling    Tongue swelling    Family  History  Problem Relation Age of Onset   Suicidality Mother    Lung disease Father     Social History:  reports that he has never smoked. His smokeless tobacco use includes snuff. He reports current alcohol use of about 3.0 - 4.0 standard drinks of alcohol per week. He reports that he does not currently use drugs after having used the following drugs: "Crack" cocaine and Marijuana.  ROS: A complete review of systems was performed.  All systems are negative except for pertinent findings as noted.  Physical Exam:  Vital signs in last 24 hours:   Constitutional:  Alert and oriented, No acute distress Cardiovascular: Regular rate and rhythm Respiratory: Normal respiratory effort,  Lungs clear bilaterally GI: Abdomen is soft, nontender, nondistended, no abdominal masses Lymphatic: No lymphadenopathy Neurologic: Grossly intact, no focal deficits Psychiatric: Normal mood and affect   Laboratory Data:  No results for input(s): "WBC", "HGB", "HCT", "PLT" in the last 72 hours.  No results for input(s): "NA", "K", "CL", "GLUCOSE", "BUN", "CALCIUM", "CREATININE" in the last 72 hours.  Invalid input(s): "CO3"   No results found for this or any previous visit (from the past 24 hour(s)). No results found for this or any previous visit (from the past 240 hour(s)).  Renal Function: No results for input(s): "CREATININE" in the last 168 hours. CrCl cannot be calculated (Patient's most recent lab result is older than the maximum 21 days allowed.).  Radiologic Imaging: No results found.  Assessment:  Conall Vangorder. is a 62 y.o. year old M with a left renal stone  Plan:  L ESWL as planned  Donald Pore, MD 04/09/2022, 7:57 AM  [facility] Pager: [phone]

## 2022-04-09 NOTE — Discharge Instructions (Addendum)
1. You should strain your urine and collect all fragments and bring them to your follow up appointment.  2. You should take your pain medication as needed.  Please call if your pain is severe to the point that it is not controlled with your pain medication. 3. You should call if you develop fever > 101 or persistent nausea or vomiting. 4. Your doctor may prescribe tamsulosin to take to help facilitate stone passage.    Post Anesthesia Home Care Instructions  Activity: Get plenty of rest for the remainder of the day. A responsible individual must stay with you for 24 hours following the procedure.  For the next 24 hours, DO NOT: -Drive a car -Operate machinery -Drink alcoholic beverages -Take any medication unless instructed by your physician -Make any legal decisions or sign important papers.  Meals: Start with liquid foods such as gelatin or soup. Progress to regular foods as tolerated. Avoid greasy, spicy, heavy foods. If nausea and/or vomiting occur, drink only clear liquids until the nausea and/or vomiting subsides. Call your physician if vomiting continues.  Special Instructions/Symptoms: Your throat may feel dry or sore from the anesthesia or the breathing tube placed in your throat during surgery. If this causes discomfort, gargle with warm salt water. The discomfort should disappear within 24 hours.  

## 2022-04-09 NOTE — Op Note (Signed)
See [employer] operative note scanned into chart. Also because of the size, density, location and other factors that cannot be anticipated I feel this will likely be a staged procedure. This fact supersedes any indication in the scanned [place] stone operative note to the contrary.  Donald Pore MD 04/09/2022, 10:45 AM  [facility]  Pager: [phone]

## 2022-04-10 ENCOUNTER — Encounter (HOSPITAL_BASED_OUTPATIENT_CLINIC_OR_DEPARTMENT_OTHER): Payer: Self-pay | Admitting: Urology

## 2022-04-30 DIAGNOSIS — N200 Calculus of kidney: Secondary | ICD-10-CM | POA: Diagnosis not present

## 2022-05-01 ENCOUNTER — Other Ambulatory Visit: Payer: Self-pay | Admitting: Urology

## 2022-05-01 ENCOUNTER — Encounter (HOSPITAL_BASED_OUTPATIENT_CLINIC_OR_DEPARTMENT_OTHER): Payer: Self-pay | Admitting: Urology

## 2022-05-01 DIAGNOSIS — N200 Calculus of kidney: Secondary | ICD-10-CM | POA: Diagnosis not present

## 2022-05-01 NOTE — Progress Notes (Signed)
Pre-op phone call attempted. Patient would like for someone to call him back at 2:00pm.

## 2022-05-01 NOTE — Progress Notes (Signed)
ESL patient instruction call completed with patient.  His driver day of surgery will be his father-in-law.  His wife will care for him 24 hours.  Medications discussed and he will hold his ASA 72 hours.  Non smoker. No Alcohol discussed.

## 2022-05-03 ENCOUNTER — Encounter: Payer: Self-pay | Admitting: Radiology

## 2022-05-04 ENCOUNTER — Encounter (HOSPITAL_BASED_OUTPATIENT_CLINIC_OR_DEPARTMENT_OTHER)
Admission: RE | Disposition: A | Discharge status: Home / Self Care | Payer: Self-pay | Source: Home / Self Care | Attending: Urology

## 2022-05-04 ENCOUNTER — Ambulatory Visit (HOSPITAL_COMMUNITY): Payer: 59

## 2022-05-04 ENCOUNTER — Encounter (HOSPITAL_BASED_OUTPATIENT_CLINIC_OR_DEPARTMENT_OTHER): Payer: Self-pay | Admitting: Urology

## 2022-05-04 ENCOUNTER — Ambulatory Visit (HOSPITAL_BASED_OUTPATIENT_CLINIC_OR_DEPARTMENT_OTHER)
Admission: RE | Admit: 2022-05-04 | Discharge: 2022-05-04 | Disposition: A | Discharge status: Home / Self Care | Payer: 59 | Attending: Urology | Admitting: Urology

## 2022-05-04 DIAGNOSIS — N200 Calculus of kidney: Secondary | ICD-10-CM

## 2022-05-04 HISTORY — PX: EXTRACORPOREAL SHOCK WAVE LITHOTRIPSY: SHX1557

## 2022-05-04 LAB — GLUCOSE, CAPILLARY: Glucose-Capillary: 119 mg/dL — ABNORMAL HIGH (ref 70–99)

## 2022-05-04 SURGERY — LITHOTRIPSY, ESWL
Anesthesia: LOCAL | Laterality: Left

## 2022-05-04 MED ORDER — OXYCODONE-ACETAMINOPHEN 5-325 MG PO TABS: 1.0000 | ORAL_TABLET | ORAL | 0 refills | Status: DC | PRN

## 2022-05-04 MED ORDER — DIPHENHYDRAMINE HCL 25 MG PO CAPS
ORAL_CAPSULE | ORAL | Status: AC
  Filled 2022-05-04: qty 1

## 2022-05-04 MED ORDER — DIAZEPAM 5 MG PO TABS
ORAL_TABLET | ORAL | Status: AC
  Filled 2022-05-04: qty 2

## 2022-05-04 MED ORDER — DIPHENHYDRAMINE HCL 25 MG PO CAPS
25.0000 mg | ORAL_CAPSULE | ORAL | Status: AC
  Administered 2022-05-04: 25 mg via ORAL

## 2022-05-04 MED ORDER — CIPROFLOXACIN HCL 500 MG PO TABS
ORAL_TABLET | ORAL | Status: AC
  Filled 2022-05-04: qty 1

## 2022-05-04 MED ORDER — SODIUM CHLORIDE 0.9 % IV SOLN: INTRAVENOUS | Status: DC

## 2022-05-04 MED ORDER — CIPROFLOXACIN HCL 500 MG PO TABS
500.0000 mg | ORAL_TABLET | ORAL | Status: AC
  Administered 2022-05-04: 500 mg via ORAL

## 2022-05-04 MED ORDER — DIAZEPAM 5 MG PO TABS
10.0000 mg | ORAL_TABLET | ORAL | Status: AC
  Administered 2022-05-04: 10 mg via ORAL

## 2022-05-04 NOTE — H&P (Signed)
Urology Preoperative H&P   Chief Complaint: Left renal stone  History of Present Illness: [name]. is a 62 y.o. male with a left upper pole stone here for left ESWL. Denies fevers, chills, dysuria.    Past Medical History:  Diagnosis Date   Anxiety    Arthritis    Bladder stone    Depression    Diabetes mellitus without complication (HCC)    GERD (gastroesophageal reflux disease)    Gout    History of kidney stones    Hypertension    Sleep apnea    CPAP    Past Surgical History:  Procedure Laterality Date   ANTERIOR CERVICAL DECOMP/DISCECTOMY FUSION N/A 02/15/2020   Procedure: Anterior Cervical Discectomy and Fusion Cervical Three-Cervical Four/Cervical Four-Cervical Five with Hardware Removal;  Surgeon: Eustace Moore, MD;  Location: [facility];  Service: Neurosurgery;  Laterality: N/A;  3C   BICEPS TENDON REPAIR Left    CERVICAL FUSION     EXTRACORPOREAL SHOCK WAVE LITHOTRIPSY Left 04/09/2022   Procedure: LEFT EXTRACORPOREAL SHOCK WAVE LITHOTRIPSY (ESWL);  Surgeon: Vira Agar, MD;  Location: [facility];  Service: Urology;  Laterality: Left;  75 MINUTES NEEDED FOR CASE   HERNIA REPAIR Right    inguinal   LITHOTRIPSY     REVERSE SHOULDER ARTHROPLASTY Right 06/23/2020   Procedure: REVERSE SHOULDER ARTHROPLASTY;  Surgeon: Justice Britain, MD;  Location: [facility];  Service: Orthopedics;  Laterality: Right;  117mn   SHOULDER ARTHROSCOPY WITH ROTATOR CUFF REPAIR AND SUBACROMIAL DECOMPRESSION Left 07/29/2012   Procedure: LEFT SHOULDER ARTHROSCOPY WITH SUBACROMIAL DECOMPRESSION, DISTAL CLAVICLE RESECTION/ROTATOR CUFF REPAIR ;  Surgeon: KMarin Shutter MD;  Location[place] [facility]  Service: Orthopedics;  Laterality: Left;   SHOULDER SURGERY     UMBILICAL HERNIA REPAIR      Allergies:  Allergies  Allergen Reactions   Kiwi Extract Swelling    Tongue swelling   Strawberry Extract Swelling    Tongue swelling    Family History  Problem Relation Age of Onset    Suicidality Mother    Lung disease Father     Social History:  reports that he has never smoked. His smokeless tobacco use includes snuff. He reports current alcohol use of about 3.0 - 4.0 standard drinks of alcohol per week. He reports that he does not currently use drugs after having used the following drugs: "Crack" cocaine and Marijuana.  ROS: A complete review of systems was performed.  All systems are negative except for pertinent findings as noted.  Physical Exam:  Vital signs in last 24 hours: Temp:  [97.7 F (36.5 C)] 97.7 F (36.5 C) (03/01 0950) Pulse Rate:  [52] 52 (03/01 0950) Resp:  [18] 18 (03/01 0950) BP: (130)/(73) 130/73 (03/01 0950) SpO2:  [95 %] 95 % (03/01 0950) Weight:  [113.4 kg] 113.4 kg (03/01 0950) Constitutional:  Alert and oriented, No acute distress Cardiovascular: Regular rate and rhythm Respiratory: Normal respiratory effort, Lungs clear bilaterally GI: Abdomen is soft, nontender, nondistended, no abdominal masses GU: No CVA tenderness Lymphatic: No lymphadenopathy Neurologic: Grossly intact, no focal deficits Psychiatric: Normal mood and affect  Laboratory Data:  No results for input(s): "WBC", "HGB", "HCT", "PLT" in the last 72 hours.  No results for input(s): "NA", "K", "CL", "GLUCOSE", "BUN", "CALCIUM", "CREATININE" in the last 72 hours.  Invalid input(s): "CO3"   Results for orders placed or performed during the hospital encounter of 05/04/22 (from the past 24 hour(s))  Glucose, capillary  Status: Abnormal   Collection Time: 05/04/22 10:04 AM  Result Value Ref Range   Glucose-Capillary 119 (H) 70 - 99 mg/dL   No results found for this or any previous visit (from the past 240 hour(s)).  Renal Function: No results for input(s): "CREATININE" in the last 168 hours. CrCl cannot be calculated (Patient's most recent lab result is older than the maximum 21 days allowed.).  Radiologic Imaging: No results found.  I independently reviewed  the above imaging studies.  Assessment and Plan [name]. is a 62 y.o. male with a left upper pole stone here for left ESWL.  The risks, benefits and alternatives of left ESWL was discussed with the patient. I described the risks which include arrhythmia, kidney contusion, kidney hemorrhage, need for transfusion, back discomfort, flank ecchymosis, flank abrasion, inability to fracture the stone, inability to pass stone fragments, Steinstrasse, infection associated with obstructing stones, need for an alternative surgical procedure and possible need for repeat shockwave lithotripsy.  The patient voices understanding and wishes to proceed.      Matt R. Johnnie Goynes MD 05/04/2022, 11:08 AM  [facility] Pager: [phone]): [phone]

## 2022-05-04 NOTE — Op Note (Signed)
ESWL Operative Note  Treating Physician: Rexene Alberts, MD  Pre-op diagnosis: Left renal stone  Post-op diagnosis: Same   Procedure: Left ESWL  See [facility] Ever[facility] OP note scanned into chart. Also because of the size, density, location and other factors that cannot be anticipated I feel this will likely be a staged procedure. This fact supersedes any indication in the scanned [place] [facility] operative note to the contrary.  [facility]  Pager: [phone]

## 2022-05-04 NOTE — Discharge Instructions (Addendum)
Activity:  You are encouraged to ambulate frequently (about every hour during waking hours) to help prevent blood clots from forming in your legs or lungs.    Diet: You should advance your diet as instructed by your physician.  It will be normal to have some bloating, nausea, and abdominal discomfort intermittently.  Prescriptions:  You will be provided a prescription for pain medication to take as needed.  If your pain is not severe enough to require the prescription pain medication, you may take extra strength Tylenol instead which will have less side effects.  You should also take a prescribed stool softener to avoid straining with bowel movements as the prescription pain medication may constipate you.  What to call us about: You should call the office ([phone]) if you develop fever > 101 or develop persistent vomiting. Activity:  You are encouraged to ambulate frequently (about every hour during waking hours) to help prevent blood clots from forming in your legs or lungs.  

## 2022-05-07 ENCOUNTER — Encounter (HOSPITAL_BASED_OUTPATIENT_CLINIC_OR_DEPARTMENT_OTHER): Payer: Self-pay | Admitting: Urology

## 2022-05-25 DIAGNOSIS — N200 Calculus of kidney: Secondary | ICD-10-CM | POA: Diagnosis not present

## 2022-06-26 DIAGNOSIS — D72829 Elevated white blood cell count, unspecified: Secondary | ICD-10-CM | POA: Diagnosis not present

## 2022-06-26 DIAGNOSIS — K219 Gastro-esophageal reflux disease without esophagitis: Secondary | ICD-10-CM | POA: Diagnosis not present

## 2022-06-26 DIAGNOSIS — E1165 Type 2 diabetes mellitus with hyperglycemia: Secondary | ICD-10-CM | POA: Diagnosis not present

## 2022-06-26 DIAGNOSIS — E7849 Other hyperlipidemia: Secondary | ICD-10-CM | POA: Diagnosis not present

## 2022-06-26 DIAGNOSIS — I10 Essential (primary) hypertension: Secondary | ICD-10-CM | POA: Diagnosis not present

## 2022-07-03 DIAGNOSIS — J301 Allergic rhinitis due to pollen: Secondary | ICD-10-CM | POA: Diagnosis not present

## 2022-07-03 DIAGNOSIS — M19011 Primary osteoarthritis, right shoulder: Secondary | ICD-10-CM | POA: Diagnosis not present

## 2022-07-03 DIAGNOSIS — G5601 Carpal tunnel syndrome, right upper limb: Secondary | ICD-10-CM | POA: Diagnosis not present

## 2022-07-03 DIAGNOSIS — R4582 Worries: Secondary | ICD-10-CM | POA: Diagnosis not present

## 2022-07-03 DIAGNOSIS — M1009 Idiopathic gout, multiple sites: Secondary | ICD-10-CM | POA: Diagnosis not present

## 2022-07-03 DIAGNOSIS — I10 Essential (primary) hypertension: Secondary | ICD-10-CM | POA: Diagnosis not present

## 2022-07-03 DIAGNOSIS — K2100 Gastro-esophageal reflux disease with esophagitis, without bleeding: Secondary | ICD-10-CM | POA: Diagnosis not present

## 2022-07-03 DIAGNOSIS — Z23 Encounter for immunization: Secondary | ICD-10-CM | POA: Diagnosis not present

## 2022-07-03 DIAGNOSIS — E1165 Type 2 diabetes mellitus with hyperglycemia: Secondary | ICD-10-CM | POA: Diagnosis not present

## 2022-07-03 DIAGNOSIS — E7849 Other hyperlipidemia: Secondary | ICD-10-CM | POA: Diagnosis not present

## 2022-08-23 ENCOUNTER — Encounter: Payer: Self-pay | Admitting: Internal Medicine

## 2022-08-23 ENCOUNTER — Ambulatory Visit: Payer: 59 | Attending: Internal Medicine | Admitting: Internal Medicine

## 2022-08-23 VITALS — BP 134/84 | HR 55 | Ht 67.0 in | Wt 245.6 lb

## 2022-08-23 DIAGNOSIS — I10 Essential (primary) hypertension: Secondary | ICD-10-CM

## 2022-08-23 NOTE — Progress Notes (Signed)
Cardiology Office Note  Date: 08/23/2022   ID: [name]., DOB 08-May-1960, MRN [id number]  PCP:  Richardean Chimera, MD  Cardiologist:  Marjo Bicker, MD Electrophysiologist:  None   Reason for Office Visit: Follow-up of chest pain   History of Present Illness: [name]. is a 62 y.o. male known to have HTN, DM 2, OSA on CPAP therapy is here for follow-up visit.  Patient was referred to cardiology clinic in 02/2022 for evaluation of 1 time episode of substernal chest pressure when he was in a heated argument with his wife. BP at that time was 220/110 mmHg.  He underwent CTA cardiac that showed coronary calcium score of 51.8 (68 percentile for age and sex matched control), CAD RADS 3 with moderate stenosis and negative FFR.  Echocardiogram showed normal LVEF and no valvular heart disease.  He is here today for follow-up visit.  He did not have any recurrent chest pains.  No DOE, dizziness, syncope, palpitations or leg swelling.  Has OSA but does not like to use CPAP.  Dips occasionally, once a week.  Past Medical History:  Diagnosis Date   Anxiety    Arthritis    Bladder stone    Depression    Diabetes mellitus without complication (HCC)    GERD (gastroesophageal reflux disease)    Gout    History of kidney stones    Hypertension    Sleep apnea    CPAP    Past Surgical History:  Procedure Laterality Date   ANTERIOR CERVICAL DECOMP/DISCECTOMY FUSION N/A 02/15/2020   Procedure: Anterior Cervical Discectomy and Fusion Cervical Three-Cervical Four/Cervical Four-Cervical Five with Hardware Removal;  Surgeon: Tommy Alert, MD;  Location: [facility];  Service: Neurosurgery;  Laterality: N/A;  3C   BICEPS TENDON REPAIR Left    CERVICAL FUSION     EXTRACORPOREAL SHOCK WAVE LITHOTRIPSY Left 04/09/2022   Procedure: LEFT EXTRACORPOREAL SHOCK WAVE LITHOTRIPSY (ESWL);  Surgeon: Tommy Arias, MD;  Location: [facility];  Service: Urology;  Laterality:  Left;  75 MINUTES NEEDED FOR CASE   EXTRACORPOREAL SHOCK WAVE LITHOTRIPSY Left 05/04/2022   Procedure: LEFT EXTRACORPOREAL SHOCK WAVE LITHOTRIPSY (ESWL);  Surgeon: Tommy Hick, MD;  Location: [facility];  Service: Urology;  Laterality: Left;   HERNIA REPAIR [name]    inguinal   LITHOTRIPSY     REVERSE SHOULDER ARTHROPLASTY [name] 06/23/2020   Procedure: REVERSE SHOULDER ARTHROPLASTY;  Surgeon: Tommy Hanly, MD;  Location: [facility];  Service: Orthopedics;  Laterality: [name];    SHOULDER ARTHROSCOPY WITH ROTATOR CUFF REPAIR AND SUBACROMIAL DECOMPRESSION Left 07/29/2012   Procedure: LEFT SHOULDER ARTHROSCOPY WITH SUBACROMIAL DECOMPRESSION, DISTAL CLAVICLE RESECTION/ROTATOR CUFF REPAIR ;  Surgeon: Tommy Lange, MD;  Location: [facility] OR;  Service: Orthopedics;  Laterality: Left;   SHOULDER SURGERY     UMBILICAL HERNIA REPAIR      Current Outpatient Medications  Medication Sig Dispense Refill   acetaminophen (TYLENOL) 650 MG CR tablet Take 650 mg by mouth every 8 (eight) hours as needed for pain.     allopurinol (ZYLOPRIM) 300 MG tablet Take 1 tablet (300 mg total) by mouth daily. (Patient taking differently: Take 450 mg by mouth daily.) 30 tablet 1   ALPRAZolam (XANAX) 0.5 MG tablet Take 0.5 mg by mouth 3 (three) times daily as needed for anxiety.     amLODipine (NORVASC) 5 MG tablet Take 5 mg by mouth daily.     aspirin  EC 81 MG tablet Take 1 tablet (81 mg total) by mouth daily. Swallow whole. 90 tablet 3   atorvastatin (LIPITOR) 20 MG tablet Take 1 tablet (20 mg total) by mouth daily. 30 tablet 1   famotidine (PEPCID) 40 MG tablet Take 40 mg by mouth daily.     gabapentin (NEURONTIN) 300 MG capsule Take 300-600 mg by mouth See admin instructions. Take 600 mg in the morning and 300 mg at night     lisinopril (ZESTRIL) 40 MG tablet Take 40 mg by mouth daily.     Menthol, Topical Analgesic, (BIOFREEZE EX) Apply 1 application topically daily as needed (pain).     metFORMIN  (GLUCOPHAGE-XR) 500 MG 24 hr tablet Take 1,000 mg by mouth at bedtime.     metoprolol tartrate (LOPRESSOR) 50 MG tablet Take 1 tablet (50 mg total) by mouth 2 (two) times daily. 60 tablet 1   nitroGLYCERIN (NITROSTAT) 0.4 MG SL tablet Place 1 tablet (0.4 mg total) under the tongue every 5 (five) minutes x 3 doses as needed for chest pain (if no relief after 3rd dose, proceed to ED or call 911). 25 tablet 3   NON FORMULARY 1 tablet daily. Super Beta Prostate     ondansetron (ZOFRAN) 4 MG tablet Take 1 tablet (4 mg total) by mouth every 8 (eight) hours as needed for nausea or vomiting. 10 tablet 0   oxyCODONE-acetaminophen (PERCOCET) 5-325 MG tablet Take 1 tablet by mouth every 4 (four) hours as needed for up to 20 doses (max 6 q). 20 tablet 0   pantoprazole (PROTONIX) 40 MG tablet Take 40 mg by mouth daily.     sertraline (ZOLOFT) 100 MG tablet Take 100 mg by mouth daily.      tamsulosin (FLOMAX) 0.4 MG CAPS capsule Take 1 capsule (0.4 mg total) by mouth daily. 30 capsule 1   No current facility-administered medications for this visit.   Allergies:  Kiwi extract and Strawberry extract   Social History: The patient  reports that he has never smoked. His smokeless tobacco use includes snuff. He reports current alcohol use of about 3.0 - 4.0 standard drinks of alcohol per week. He reports that he does not currently use drugs after having used the following drugs: "Crack" cocaine and Marijuana.   Family History: The patient's family history includes Lung disease in his father; Suicidality in his mother.   ROS:  Please see the history of present illness. Otherwise, complete review of systems is positive for none.  All other systems are reviewed and negative.   Physical Exam: VS:  BP 134/84   Pulse (!) 55   Ht 5\' 7"  (1.702 m)   Wt 245 lb 9.6 oz (111.4 kg)   SpO2 95%   BMI 38.47 kg/m , BMI Body mass index is 38.47 kg/m.  Wt Readings from Last 3 Encounters:  08/23/22 245 lb 9.6 oz (111.4 kg)   05/04/22 250 lb (113.4 kg)  04/09/22 250 lb 6.4 oz (113.6 kg)    General: Patient appears comfortable at rest. HEENT: Conjunctiva and lids normal, oropharynx clear with moist mucosa. Neck: Supple, no elevated JVP or carotid bruits, no thyromegaly. Lungs: Clear to auscultation, nonlabored breathing at rest. Cardiac: Regular rate and rhythm, no S3 or significant systolic murmur, no pericardial rub. Abdomen: Soft, nontender, no hepatomegaly, bowel sounds present, no guarding or rebound. Extremities: No pitting edema, distal pulses 2+. Skin: Warm and dry. Musculoskeletal: No kyphosis. Neuropsychiatric: [name] and oriented x3, affect grossly appropriate.  ECG: Normal  sinus rhythm and no ST-T changes  Recent Labwork: 02/07/2022: ALT 36; AST 31; BUN 9; Creatinine, Ser 0.92; Hemoglobin 15.3; Magnesium 2.0; Platelets 159; Potassium 3.9; Sodium 140     Component Value Date/Time   CHOL 137 04/15/2018 1049   TRIG 136 04/15/2018 1049   HDL 39 (L) 04/15/2018 1049   CHOLHDL 3.5 04/15/2018 1049   VLDL 27 04/15/2018 1049   LDLCALC 71 04/15/2018 1049    Other Studies Reviewed Today: I reviewed the ER notes from 02/07/2022 for chest pain visit.  Assessment and Plan: Patient is a 62 year old M known to have HTN, DM 2, OSA on CPAP is here for follow-up visit.  # Atypical chest pain likely secondary to elevated BP, resolved -Patient had one-time episode of chest pain in the setting of elevated BP 220/110 mmHg in 12/23.  No recurrence since then.  CTA cardiac in 12/23 that showed coronary calcium score of 51.8 (68 percentile for age and sex matched control), CAD RADS 3 with moderate stenosis and negative FFR.  Echocardiogram in 12/23 showed normal LVEF and no valvular heart disease.  -Continue aspirin 81 mg once daily and atorvastatin 20 mg nightly due to the presence of mixed plaque in the Lcx.  # HTN, controlled -Continue amlodipine 5 mg once daily, lisinopril 40 mg once daily, metoprolol tartrate  50 mg twice daily.  HTN management per PCP.  # HLD, at goal -Continue atorvastatin 20 mg nightly. Goal LDL less than 100.  # OSA not on CPAP -Patient said he does not like to use CPAP.  Encourage compliance.  # Nicotine abuse -Patient dips every day (although he says occasionally). Dip cessation instruction/counseling given:  Counseled patient on the dangers of tobacco use, advised patient to stop dipping and reviewed strategies to maximize success.  I have spent a total of 30 minutes with patient reviewing chart,EKGs, labs and examining patient as well as establishing an assessment and plan that was discussed with the patient.  > 50% of time was spent in direct patient care.  '    Medication Adjustments/Labs and Tests Ordered: Current medicines are reviewed at length with the patient today.  Concerns regarding medicines are outlined above.   Tests Ordered: Orders Placed This Encounter  Procedures   EKG 12-Lead    Medication Changes: No orders of the defined types were placed in this encounter.   Disposition:  Follow up 1 year  Signed Brylon Brenning Verne Spurr, MD, 08/23/2022 1:12 PM    [facility] at [facility] [address] [place], [place], [place] [address]

## 2022-08-23 NOTE — Patient Instructions (Signed)
Medication Instructions:  Your physician recommends that you continue on your current medications as directed. Please refer to the Current Medication list given to you today.   Labwork: None  Testing/Procedures: None  Follow-Up: Your physician recommends that you schedule a follow-up appointment in: 1 year. You will receive a reminder call in the mail in about 10 months reminding you to call and schedule your appointment. If you don't receive this call, please contact our office.   Any Other Special Instructions Will Be Listed Below (If Applicable).  If you need a refill on your cardiac medications before your next appointment, please call your pharmacy.  

## 2022-10-25 DIAGNOSIS — K219 Gastro-esophageal reflux disease without esophagitis: Secondary | ICD-10-CM | POA: Diagnosis not present

## 2022-10-25 DIAGNOSIS — E7849 Other hyperlipidemia: Secondary | ICD-10-CM | POA: Diagnosis not present

## 2022-10-25 DIAGNOSIS — E1165 Type 2 diabetes mellitus with hyperglycemia: Secondary | ICD-10-CM | POA: Diagnosis not present

## 2022-10-25 DIAGNOSIS — Z1329 Encounter for screening for other suspected endocrine disorder: Secondary | ICD-10-CM | POA: Diagnosis not present

## 2022-10-25 DIAGNOSIS — R7301 Impaired fasting glucose: Secondary | ICD-10-CM | POA: Diagnosis not present

## 2022-11-01 DIAGNOSIS — G4733 Obstructive sleep apnea (adult) (pediatric): Secondary | ICD-10-CM | POA: Diagnosis not present

## 2022-11-01 DIAGNOSIS — K2100 Gastro-esophageal reflux disease with esophagitis, without bleeding: Secondary | ICD-10-CM | POA: Diagnosis not present

## 2022-11-01 DIAGNOSIS — E1165 Type 2 diabetes mellitus with hyperglycemia: Secondary | ICD-10-CM | POA: Diagnosis not present

## 2022-11-01 DIAGNOSIS — E7849 Other hyperlipidemia: Secondary | ICD-10-CM | POA: Diagnosis not present

## 2022-11-01 DIAGNOSIS — R059 Cough, unspecified: Secondary | ICD-10-CM | POA: Diagnosis not present

## 2022-11-01 DIAGNOSIS — M1009 Idiopathic gout, multiple sites: Secondary | ICD-10-CM | POA: Diagnosis not present

## 2022-11-01 DIAGNOSIS — U071 COVID-19: Secondary | ICD-10-CM | POA: Diagnosis not present

## 2022-11-01 DIAGNOSIS — I10 Essential (primary) hypertension: Secondary | ICD-10-CM | POA: Diagnosis not present

## 2022-11-01 DIAGNOSIS — M19011 Primary osteoarthritis, right shoulder: Secondary | ICD-10-CM | POA: Diagnosis not present

## 2022-11-01 DIAGNOSIS — G5601 Carpal tunnel syndrome, right upper limb: Secondary | ICD-10-CM | POA: Diagnosis not present

## 2022-11-01 DIAGNOSIS — R4582 Worries: Secondary | ICD-10-CM | POA: Diagnosis not present

## 2023-03-04 DIAGNOSIS — E559 Vitamin D deficiency, unspecified: Secondary | ICD-10-CM | POA: Diagnosis not present

## 2023-03-04 DIAGNOSIS — Z0001 Encounter for general adult medical examination with abnormal findings: Secondary | ICD-10-CM | POA: Diagnosis not present

## 2023-03-04 DIAGNOSIS — E7849 Other hyperlipidemia: Secondary | ICD-10-CM | POA: Diagnosis not present

## 2023-03-04 DIAGNOSIS — E039 Hypothyroidism, unspecified: Secondary | ICD-10-CM | POA: Diagnosis not present

## 2023-03-04 DIAGNOSIS — E1165 Type 2 diabetes mellitus with hyperglycemia: Secondary | ICD-10-CM | POA: Diagnosis not present

## 2023-03-04 DIAGNOSIS — R739 Hyperglycemia, unspecified: Secondary | ICD-10-CM | POA: Diagnosis not present

## 2023-03-04 DIAGNOSIS — D72829 Elevated white blood cell count, unspecified: Secondary | ICD-10-CM | POA: Diagnosis not present

## 2023-03-04 DIAGNOSIS — I10 Essential (primary) hypertension: Secondary | ICD-10-CM | POA: Diagnosis not present

## 2023-03-08 DIAGNOSIS — E7849 Other hyperlipidemia: Secondary | ICD-10-CM | POA: Diagnosis not present

## 2023-03-08 DIAGNOSIS — M1009 Idiopathic gout, multiple sites: Secondary | ICD-10-CM | POA: Diagnosis not present

## 2023-03-08 DIAGNOSIS — Z0001 Encounter for general adult medical examination with abnormal findings: Secondary | ICD-10-CM | POA: Diagnosis not present

## 2023-03-08 DIAGNOSIS — E1165 Type 2 diabetes mellitus with hyperglycemia: Secondary | ICD-10-CM | POA: Diagnosis not present

## 2023-03-08 DIAGNOSIS — G4733 Obstructive sleep apnea (adult) (pediatric): Secondary | ICD-10-CM | POA: Diagnosis not present

## 2023-03-08 DIAGNOSIS — I10 Essential (primary) hypertension: Secondary | ICD-10-CM | POA: Diagnosis not present

## 2023-03-08 DIAGNOSIS — Z1389 Encounter for screening for other disorder: Secondary | ICD-10-CM | POA: Diagnosis not present

## 2023-03-08 DIAGNOSIS — M19011 Primary osteoarthritis, right shoulder: Secondary | ICD-10-CM | POA: Diagnosis not present

## 2023-03-08 DIAGNOSIS — Z23 Encounter for immunization: Secondary | ICD-10-CM | POA: Diagnosis not present

## 2023-03-08 DIAGNOSIS — G5601 Carpal tunnel syndrome, right upper limb: Secondary | ICD-10-CM | POA: Diagnosis not present

## 2023-07-09 DIAGNOSIS — I10 Essential (primary) hypertension: Secondary | ICD-10-CM | POA: Diagnosis not present

## 2023-07-09 DIAGNOSIS — E1165 Type 2 diabetes mellitus with hyperglycemia: Secondary | ICD-10-CM | POA: Diagnosis not present

## 2023-07-09 DIAGNOSIS — E7849 Other hyperlipidemia: Secondary | ICD-10-CM | POA: Diagnosis not present

## 2023-07-16 DIAGNOSIS — E1165 Type 2 diabetes mellitus with hyperglycemia: Secondary | ICD-10-CM | POA: Diagnosis not present

## 2023-07-16 DIAGNOSIS — E782 Mixed hyperlipidemia: Secondary | ICD-10-CM | POA: Diagnosis not present

## 2023-07-16 DIAGNOSIS — E7849 Other hyperlipidemia: Secondary | ICD-10-CM | POA: Diagnosis not present

## 2023-07-16 DIAGNOSIS — I10 Essential (primary) hypertension: Secondary | ICD-10-CM | POA: Diagnosis not present

## 2023-09-02 ENCOUNTER — Ambulatory Visit: Admitting: Internal Medicine

## 2023-09-18 DIAGNOSIS — Z112 Encounter for screening for other bacterial diseases: Secondary | ICD-10-CM | POA: Diagnosis not present

## 2023-09-18 DIAGNOSIS — J029 Acute pharyngitis, unspecified: Secondary | ICD-10-CM | POA: Diagnosis not present

## 2023-09-18 DIAGNOSIS — K1120 Sialoadenitis, unspecified: Secondary | ICD-10-CM | POA: Diagnosis not present

## 2023-10-14 ENCOUNTER — Encounter: Payer: Self-pay | Admitting: Internal Medicine

## 2023-10-14 ENCOUNTER — Ambulatory Visit: Attending: Internal Medicine | Admitting: Internal Medicine

## 2023-10-14 VITALS — BP 130/80 | HR 49 | Ht 67.0 in | Wt 231.0 lb

## 2023-10-14 DIAGNOSIS — R001 Bradycardia, unspecified: Secondary | ICD-10-CM | POA: Insufficient documentation

## 2023-10-14 DIAGNOSIS — I10 Essential (primary) hypertension: Secondary | ICD-10-CM | POA: Diagnosis not present

## 2023-10-14 MED ORDER — METOPROLOL TARTRATE 25 MG PO TABS: 25.0000 mg | ORAL_TABLET | Freq: Two times a day (BID) | ORAL | 3 refills | Status: DC

## 2023-10-14 NOTE — Progress Notes (Signed)
 Cardiology Office Note  Date: 10/14/2023   ID: [name] [name]., DOB September 10, 1960, MRN [id number]  PCP:  Toribio Jerel MATSU, MD  Cardiologist:  Diannah SHAUNNA Maywood, MD Electrophysiologist:  None   Reason for Office Visit: Follow-up of chest pain   History of Present Illness: [name]. is a 63 y.o. male known to have HTN, DM 2, OSA on CPAP therapy is here for follow-up visit.  Patient was referred to cardiology clinic in 02/2022 for evaluation of 1 time episode of substernal chest pressure when he was in a heated argument with his wife. BP at that time was 220/110 mmHg.  He underwent CTA cardiac that showed coronary calcium  score of 51.8 (68 percentile for age and sex matched control), CAD RADS 3 with moderate stenosis and negative FFR.  Echocardiogram showed normal LVEF and no valvular heart disease.  He is here today for follow-up visit.    He had 1 episode of chest pain more than 6 months ago that resolved with SL NTG.  He did not have any recurrence of chest pain.  No DOE, dizziness, syncope, leg swelling.  He dips here and there.  He has OSA but does not like to use CPAP.  Past Medical History:  Diagnosis Date   Anxiety    Arthritis    Bladder stone    Depression    Diabetes mellitus without complication (HCC)    GERD (gastroesophageal reflux disease)    Gout    History of kidney stones    Hypertension    Sleep apnea    CPAP    Past Surgical History:  Procedure Laterality Date   ANTERIOR CERVICAL DECOMP/DISCECTOMY FUSION N/A 02/15/2020   Procedure: Anterior Cervical Discectomy and Fusion Cervical Three-Cervical Four/Cervical Four-Cervical Five with Hardware Removal;  Surgeon: Joshua Alm RAMAN, MD;  Location: [facility] OR;  Service: Neurosurgery;  Laterality: N/A;  3C   BICEPS TENDON REPAIR Left    CERVICAL FUSION     EXTRACORPOREAL SHOCK WAVE LITHOTRIPSY Left 04/09/2022   Procedure: LEFT EXTRACORPOREAL SHOCK WAVE LITHOTRIPSY (ESWL);  Surgeon: Lovie Arlyss CROME, MD;   Location: [facility];  Service: Urology;  Laterality: Left;  75 MINUTES NEEDED FOR CASE   EXTRACORPOREAL SHOCK WAVE LITHOTRIPSY Left 05/04/2022   Procedure: LEFT EXTRACORPOREAL SHOCK WAVE LITHOTRIPSY (ESWL);  Surgeon: Selma Donnice SAUNDERS, MD;  Location: [facility];  Service: Urology;  Laterality: Left;   HERNIA REPAIR Right    inguinal   LITHOTRIPSY     REVERSE SHOULDER ARTHROPLASTY Right 06/23/2020   Procedure: REVERSE SHOULDER ARTHROPLASTY;  Surgeon: Melita Drivers, MD;  Location: [facility];  Service: Orthopedics;  Laterality: Right;    SHOULDER ARTHROSCOPY WITH ROTATOR CUFF REPAIR AND SUBACROMIAL DECOMPRESSION Left 07/29/2012   Procedure: LEFT SHOULDER ARTHROSCOPY WITH SUBACROMIAL DECOMPRESSION, DISTAL CLAVICLE RESECTION/ROTATOR CUFF REPAIR ;  Surgeon: Drivers CHRISTELLA Melita, MD;  Location: [facility] OR;  Service: Orthopedics;  Laterality: Left;   SHOULDER SURGERY     UMBILICAL HERNIA REPAIR      Current Outpatient Medications  Medication Sig Dispense Refill   acetaminophen  (TYLENOL ) 650 MG CR tablet Take 650 mg by mouth every 8 (eight) hours as needed for pain.     allopurinol  (ZYLOPRIM ) 300 MG tablet Take 1 tablet (300 mg total) by mouth daily. (Patient taking differently: Take 450 mg by mouth daily.) 30 tablet 1   ALPRAZolam  (XANAX ) 0.5 MG tablet Take 0.5 mg by mouth 3 (three) times daily as needed for anxiety.  amLODipine  (NORVASC ) 5 MG tablet Take 5 mg by mouth daily.     aspirin  EC 81 MG tablet Take 1 tablet (81 mg total) by mouth daily. Swallow whole. 90 tablet 3   atorvastatin  (LIPITOR) 20 MG tablet Take 1 tablet (20 mg total) by mouth daily. 30 tablet 1   famotidine (PEPCID) 40 MG tablet Take 40 mg by mouth daily.     gabapentin  (NEURONTIN ) 300 MG capsule Take 300-600 mg by mouth See admin instructions. Take 600 mg in the morning and 300 mg at night     lisinopril  (ZESTRIL ) 40 MG tablet Take 40 mg by mouth daily.     Menthol , Topical Analgesic, (BIOFREEZE EX) Apply  1 application topically daily as needed (pain).     metFORMIN  (GLUCOPHAGE -XR) 500 MG 24 hr tablet Take 1,000 mg by mouth at bedtime.     metoprolol  tartrate (LOPRESSOR ) 50 MG tablet Take 1 tablet (50 mg total) by mouth 2 (two) times daily. 60 tablet 1   nitroGLYCERIN  (NITROSTAT ) 0.4 MG SL tablet Place 1 tablet (0.4 mg total) under the tongue every 5 (five) minutes x 3 doses as needed for chest pain (if no relief after 3rd dose, proceed to ED or call 911). 25 tablet 3   NON FORMULARY 1 tablet daily. Super Beta Prostate     ondansetron  (ZOFRAN ) 4 MG tablet Take 1 tablet (4 mg total) by mouth every 8 (eight) hours as needed for nausea or vomiting. 10 tablet 0   oxyCODONE -acetaminophen  (PERCOCET) 5-325 MG tablet Take 1 tablet by mouth every 4 (four) hours as needed for up to 20 doses (max 6 q). 20 tablet 0   pantoprazole (PROTONIX) 40 MG tablet Take 40 mg by mouth daily.     sertraline  (ZOLOFT ) 100 MG tablet Take 100 mg by mouth daily.      tamsulosin  (FLOMAX ) 0.4 MG CAPS capsule Take 1 capsule (0.4 mg total) by mouth daily. 30 capsule 1   No current facility-administered medications for this visit.   Allergies:  Kiwi extract and Strawberry extract   Social History: The patient  reports that he has never smoked. His smokeless tobacco use includes snuff. He reports current alcohol use of about 3.0 - 4.0 standard drinks of alcohol per week. He reports that he does not currently use drugs after having used the following drugs: Crack cocaine and Marijuana.   Family History: The patient's family history includes Lung disease in his father; Suicidality in his mother.   ROS:  Please see the history of present illness. Otherwise, complete review of systems is positive for none.  All other systems are reviewed and negative.   Physical Exam: VS:  Ht 5' 7 (1.702 m)   Wt 231 lb (104.8 kg)   BMI 36.18 kg/m , BMI Body mass index is 36.18 kg/m.  Wt Readings from Last 3 Encounters:  10/14/23 231 lb  (104.8 kg)  08/23/22 245 lb 9.6 oz (111.4 kg)  05/04/22 250 lb (113.4 kg)    General: Patient appears comfortable at rest. HEENT: Conjunctiva and lids normal, oropharynx clear with moist mucosa. Neck: Supple, no elevated JVP or carotid bruits, no thyromegaly. Lungs: Clear to auscultation, nonlabored breathing at rest. Cardiac: Regular rate and rhythm, no S3 or significant systolic murmur, no pericardial rub. Abdomen: Soft, nontender, no hepatomegaly, bowel sounds present, no guarding or rebound. Extremities: No pitting edema, distal pulses 2+. Skin: Warm and dry. Musculoskeletal: No kyphosis. Neuropsychiatric: Alert and oriented x3, affect grossly appropriate.  ECG: Normal sinus  rhythm and no ST-T changes  Recent Labwork: No results found for requested labs within last 365 days.     Component Value Date/Time   CHOL 137 04/15/2018 1049   TRIG 136 04/15/2018 1049   HDL 39 (L) 04/15/2018 1049   CHOLHDL 3.5 04/15/2018 1049   VLDL 27 04/15/2018 1049   LDLCALC 71 04/15/2018 1049    Other Studies Reviewed Today: I reviewed the ER notes from 02/07/2022 for chest pain visit.  Assessment and Plan: Patient is a 63 year old M known to have HTN, DM 2, OSA on CPAP is here for follow-up visit.  # Severe sinus bradycardia - EKG today showed severe sinus bradycardia with HR 49 bpm.  He does not have any dizziness or lightheadedness or syncope but did reports fatigue.  Currently on metoprolol  titrate 50 mg twice daily.  Decrease the dose of metoprolol  tartrate from 50 mg to 25 mg twice daily. - Encouraged patient to check his blood pressures at home as well cutting back on the metoprolol  dose.  If blood pressures are elevated, need to uptitrate amlodipine .  # Atypical chest pain - Patient had one-time episode of chest pain in the setting of elevated BP 220/110 mmHg in 12/23.  He had another episode more than 6 months ago that resolved with SL NG.  CTA cardiac in 12/23 that showed coronary  calcium  score of 51.8 (68 percentile for age and sex matched control), CAD RADS 3 with moderate stenosis and negative FFR.  Echocardiogram in 12/23 showed normal LVEF and no valvular heart disease.  - Continue aspirin  81 mg once daily, atorvastatin  20 mg nightly due to presence of mixed plaque in the LCx.  # HTN, controlled - Metoprolol  as above, continue remaining antihypertensive medications, lisinopril  40 mg once daily, amlodipine  5 mg once daily.  # HLD, at goal - Continue atorvastatin  20 mg nightly, goal LDL less than 70. - LDL 58 in June 2023.  # OSA not on CPAP - Per PCP.  # Nicotine abuse - Patient dips occasionally.  Counseling provided.  I have spent a total of 30 minutes with patient reviewing chart,EKGs, labs and examining patient as well as establishing an assessment and plan that was discussed with the patient.  > 50% of time was spent in direct patient care.  '    Medication Adjustments/Labs and Tests Ordered: Current medicines are reviewed at length with the patient today.  Concerns regarding medicines are outlined above.   Tests Ordered: Orders Placed This Encounter  Procedures   EKG 12-Lead    Medication Changes: No orders of the defined types were placed in this encounter.   Disposition:  Follow up 1 year  Signed Garlen Reinig Priya Debra Calabretta, MD, 10/14/2023 10:15 AM    [facility] at [facility] [address] [place], [place], [place] [address]

## 2023-10-14 NOTE — Patient Instructions (Addendum)
 Medication Instructions:  Your physician has recommended you make the following change in your medication:  Decrease Metoprolol  Tartrate 50 mg to 25 mg twice daily Continue taking all other medications as prescribed  Labwork: None  Testing/Procedures: None  Follow-Up: Your physician recommends that you schedule a follow-up appointment in: 1 year. You will receive a reminder call in about 8 months reminding you to schedule your appointment. If you don't receive this call, please contact our office.   Any Other Special Instructions Will Be Listed Below (If Applicable). Thank you for choosing [facility]!     If you need a refill on your cardiac medications before your next appointment, please call your pharmacy.

## 2023-11-11 DIAGNOSIS — E039 Hypothyroidism, unspecified: Secondary | ICD-10-CM | POA: Diagnosis not present

## 2023-11-11 DIAGNOSIS — I10 Essential (primary) hypertension: Secondary | ICD-10-CM | POA: Diagnosis not present

## 2023-11-11 DIAGNOSIS — E7849 Other hyperlipidemia: Secondary | ICD-10-CM | POA: Diagnosis not present

## 2023-11-11 DIAGNOSIS — D72829 Elevated white blood cell count, unspecified: Secondary | ICD-10-CM | POA: Diagnosis not present

## 2023-11-11 DIAGNOSIS — R739 Hyperglycemia, unspecified: Secondary | ICD-10-CM | POA: Diagnosis not present

## 2023-11-21 DIAGNOSIS — I10 Essential (primary) hypertension: Secondary | ICD-10-CM | POA: Diagnosis not present

## 2023-11-21 DIAGNOSIS — E782 Mixed hyperlipidemia: Secondary | ICD-10-CM | POA: Diagnosis not present

## 2023-11-21 DIAGNOSIS — E7849 Other hyperlipidemia: Secondary | ICD-10-CM | POA: Diagnosis not present

## 2023-11-21 DIAGNOSIS — Z23 Encounter for immunization: Secondary | ICD-10-CM | POA: Diagnosis not present

## 2023-11-21 DIAGNOSIS — E1165 Type 2 diabetes mellitus with hyperglycemia: Secondary | ICD-10-CM | POA: Diagnosis not present

## 2023-11-22 DIAGNOSIS — Z1211 Encounter for screening for malignant neoplasm of colon: Secondary | ICD-10-CM | POA: Diagnosis not present

## 2023-11-26 ENCOUNTER — Other Ambulatory Visit: Payer: Self-pay

## 2023-11-26 ENCOUNTER — Inpatient Hospital Stay (HOSPITAL_COMMUNITY)
Admission: EM | Admit: 2023-11-26 | Discharge: 2023-11-28 | DRG: 159 | Disposition: A | Discharge status: Home / Self Care | Attending: Internal Medicine | Admitting: Internal Medicine

## 2023-11-26 ENCOUNTER — Emergency Department (HOSPITAL_COMMUNITY)

## 2023-11-26 DIAGNOSIS — E66812 Obesity, class 2: Secondary | ICD-10-CM | POA: Diagnosis present

## 2023-11-26 DIAGNOSIS — K122 Cellulitis and abscess of mouth: Secondary | ICD-10-CM | POA: Diagnosis not present

## 2023-11-26 DIAGNOSIS — Z7984 Long term (current) use of oral hypoglycemic drugs: Secondary | ICD-10-CM

## 2023-11-26 DIAGNOSIS — K219 Gastro-esophageal reflux disease without esophagitis: Secondary | ICD-10-CM | POA: Diagnosis present

## 2023-11-26 DIAGNOSIS — K1120 Sialoadenitis, unspecified: Principal | ICD-10-CM | POA: Diagnosis present

## 2023-11-26 DIAGNOSIS — T380X5A Adverse effect of glucocorticoids and synthetic analogues, initial encounter: Secondary | ICD-10-CM | POA: Diagnosis present

## 2023-11-26 DIAGNOSIS — Z825 Family history of asthma and other chronic lower respiratory diseases: Secondary | ICD-10-CM | POA: Diagnosis not present

## 2023-11-26 DIAGNOSIS — I10 Essential (primary) hypertension: Secondary | ICD-10-CM | POA: Diagnosis present

## 2023-11-26 DIAGNOSIS — M109 Gout, unspecified: Secondary | ICD-10-CM | POA: Diagnosis present

## 2023-11-26 DIAGNOSIS — Z7982 Long term (current) use of aspirin: Secondary | ICD-10-CM

## 2023-11-26 DIAGNOSIS — Z981 Arthrodesis status: Secondary | ICD-10-CM | POA: Diagnosis not present

## 2023-11-26 DIAGNOSIS — K115 Sialolithiasis: Secondary | ICD-10-CM | POA: Diagnosis not present

## 2023-11-26 DIAGNOSIS — Z818 Family history of other mental and behavioral disorders: Secondary | ICD-10-CM

## 2023-11-26 DIAGNOSIS — Z6835 Body mass index (BMI) 35.0-35.9, adult: Secondary | ICD-10-CM

## 2023-11-26 DIAGNOSIS — Z96611 Presence of right artificial shoulder joint: Secondary | ICD-10-CM | POA: Diagnosis present

## 2023-11-26 DIAGNOSIS — Z9102 Food additives allergy status: Secondary | ICD-10-CM

## 2023-11-26 DIAGNOSIS — Z720 Tobacco use: Secondary | ICD-10-CM | POA: Diagnosis not present

## 2023-11-26 DIAGNOSIS — N400 Enlarged prostate without lower urinary tract symptoms: Secondary | ICD-10-CM | POA: Diagnosis present

## 2023-11-26 DIAGNOSIS — Z79899 Other long term (current) drug therapy: Secondary | ICD-10-CM | POA: Diagnosis not present

## 2023-11-26 DIAGNOSIS — E1165 Type 2 diabetes mellitus with hyperglycemia: Secondary | ICD-10-CM | POA: Diagnosis present

## 2023-11-26 DIAGNOSIS — F32A Depression, unspecified: Secondary | ICD-10-CM | POA: Diagnosis present

## 2023-11-26 DIAGNOSIS — E1140 Type 2 diabetes mellitus with diabetic neuropathy, unspecified: Secondary | ICD-10-CM | POA: Diagnosis not present

## 2023-11-26 DIAGNOSIS — E785 Hyperlipidemia, unspecified: Secondary | ICD-10-CM | POA: Diagnosis not present

## 2023-11-26 DIAGNOSIS — F419 Anxiety disorder, unspecified: Secondary | ICD-10-CM | POA: Diagnosis present

## 2023-11-26 LAB — CBC WITH DIFFERENTIAL/PLATELET
Abs Immature Granulocytes: 0.02 K/uL (ref 0.00–0.07)
Basophils Absolute: 0 K/uL (ref 0.0–0.1)
Basophils Relative: 0 %
Eosinophils Absolute: 0.2 K/uL (ref 0.0–0.5)
Eosinophils Relative: 2 %
HCT: 42.6 % (ref 39.0–52.0)
Hemoglobin: 14.8 g/dL (ref 13.0–17.0)
Immature Granulocytes: 0 %
Lymphocytes Relative: 26 %
Lymphs Abs: 2.8 K/uL (ref 0.7–4.0)
MCH: 30.6 pg (ref 26.0–34.0)
MCHC: 34.7 g/dL (ref 30.0–36.0)
MCV: 88 fL (ref 80.0–100.0)
Monocytes Absolute: 0.7 K/uL (ref 0.1–1.0)
Monocytes Relative: 7 %
Neutro Abs: 7.1 K/uL (ref 1.7–7.7)
Neutrophils Relative %: 65 %
Platelets: 195 K/uL (ref 150–400)
RBC: 4.84 MIL/uL (ref 4.22–5.81)
RDW: 13.6 % (ref 11.5–15.5)
WBC: 10.9 K/uL — ABNORMAL HIGH (ref 4.0–10.5)
nRBC: 0 % (ref 0.0–0.2)

## 2023-11-26 LAB — BASIC METABOLIC PANEL WITH GFR
Anion gap: 11 (ref 5–15)
BUN: 11 mg/dL (ref 8–23)
CO2: 26 mmol/L (ref 22–32)
Calcium: 9 mg/dL (ref 8.9–10.3)
Chloride: 102 mmol/L (ref 98–111)
Creatinine, Ser: 1 mg/dL (ref 0.61–1.24)
GFR, Estimated: >60 mL/min
Glucose, Bld: 106 mg/dL — ABNORMAL HIGH (ref 70–99)
Potassium: 3.9 mmol/L (ref 3.5–5.1)
Sodium: 139 mmol/L (ref 135–145)

## 2023-11-26 LAB — CBG MONITORING, ED: Glucose-Capillary: 108 mg/dL — ABNORMAL HIGH (ref 70–99)

## 2023-11-26 LAB — COLOGUARD: COLOGUARD: NEGATIVE

## 2023-11-26 MED ORDER — IOHEXOL 350 MG/ML SOLN
75.0000 mL | Freq: Once | INTRAVENOUS | Status: AC | PRN
  Administered 2023-11-26: 75 mL via INTRAVENOUS

## 2023-11-26 NOTE — ED Triage Notes (Signed)
 Pt presents with throat swelling and difficulty swallowing x 3 days. Denies any SOB.

## 2023-11-26 NOTE — ED Provider Triage Note (Signed)
 Emergency Medicine Provider Triage Evaluation Note  [name]. , a 63 y.o. male  was evaluated in triage.  Pt complains of submandibular swelling, right greater than left starting about 2 days ago.  Patient reports it has been pretty stable over the past 12 hours, but he has been having difficulty swallowing due to pain.  He reports some intermittent subjective fevers.  No difficulty breathing.  Does report history of diabetes, states blood sugars controlled.  Review of Systems  Positive: Submandibular swelling Negative: Vomiting  Physical Exam  BP (!) 153/83 (BP Location: Right Arm)   Pulse (!) 57   Temp 99 F (37.2 C)   Resp 20   Wt 104 kg   SpO2 94%   BMI 35.91 kg/m  Gen:   Awake, no distress   Resp:  Normal effort  MSK:   Moves extremities without difficulty  Other:    Medical Decision Making  Medically screening exam initiated at 4:24 PM.  Appropriate orders placed.  [name]. was informed that the remainder of the evaluation will be completed by another provider, this initial triage assessment does not replace that evaluation, and the importance of remaining in the ED until their evaluation is complete.     Desiderio Chew, PA-C 11/26/23 1626

## 2023-11-27 ENCOUNTER — Encounter (HOSPITAL_COMMUNITY): Payer: Self-pay | Admitting: Family Medicine

## 2023-11-27 DIAGNOSIS — F32A Depression, unspecified: Secondary | ICD-10-CM | POA: Diagnosis present

## 2023-11-27 DIAGNOSIS — N400 Enlarged prostate without lower urinary tract symptoms: Secondary | ICD-10-CM | POA: Diagnosis present

## 2023-11-27 DIAGNOSIS — K1120 Sialoadenitis, unspecified: Principal | ICD-10-CM

## 2023-11-27 DIAGNOSIS — Z825 Family history of asthma and other chronic lower respiratory diseases: Secondary | ICD-10-CM | POA: Diagnosis not present

## 2023-11-27 DIAGNOSIS — Z818 Family history of other mental and behavioral disorders: Secondary | ICD-10-CM | POA: Diagnosis not present

## 2023-11-27 DIAGNOSIS — Z96611 Presence of right artificial shoulder joint: Secondary | ICD-10-CM | POA: Diagnosis present

## 2023-11-27 DIAGNOSIS — K122 Cellulitis and abscess of mouth: Secondary | ICD-10-CM | POA: Diagnosis not present

## 2023-11-27 DIAGNOSIS — T380X5A Adverse effect of glucocorticoids and synthetic analogues, initial encounter: Secondary | ICD-10-CM | POA: Diagnosis present

## 2023-11-27 DIAGNOSIS — E1165 Type 2 diabetes mellitus with hyperglycemia: Secondary | ICD-10-CM | POA: Diagnosis present

## 2023-11-27 DIAGNOSIS — Z79899 Other long term (current) drug therapy: Secondary | ICD-10-CM | POA: Diagnosis not present

## 2023-11-27 DIAGNOSIS — Z7982 Long term (current) use of aspirin: Secondary | ICD-10-CM | POA: Diagnosis not present

## 2023-11-27 DIAGNOSIS — Z6835 Body mass index (BMI) 35.0-35.9, adult: Secondary | ICD-10-CM | POA: Diagnosis not present

## 2023-11-27 DIAGNOSIS — K115 Sialolithiasis: Secondary | ICD-10-CM

## 2023-11-27 DIAGNOSIS — Z7984 Long term (current) use of oral hypoglycemic drugs: Secondary | ICD-10-CM | POA: Diagnosis not present

## 2023-11-27 DIAGNOSIS — Z981 Arthrodesis status: Secondary | ICD-10-CM | POA: Diagnosis not present

## 2023-11-27 DIAGNOSIS — E1140 Type 2 diabetes mellitus with diabetic neuropathy, unspecified: Secondary | ICD-10-CM | POA: Diagnosis present

## 2023-11-27 DIAGNOSIS — E785 Hyperlipidemia, unspecified: Secondary | ICD-10-CM | POA: Diagnosis present

## 2023-11-27 DIAGNOSIS — K219 Gastro-esophageal reflux disease without esophagitis: Secondary | ICD-10-CM | POA: Diagnosis present

## 2023-11-27 DIAGNOSIS — F419 Anxiety disorder, unspecified: Secondary | ICD-10-CM | POA: Diagnosis present

## 2023-11-27 DIAGNOSIS — Z9102 Food additives allergy status: Secondary | ICD-10-CM | POA: Diagnosis not present

## 2023-11-27 DIAGNOSIS — Z720 Tobacco use: Secondary | ICD-10-CM | POA: Diagnosis not present

## 2023-11-27 DIAGNOSIS — I10 Essential (primary) hypertension: Secondary | ICD-10-CM | POA: Diagnosis present

## 2023-11-27 DIAGNOSIS — M109 Gout, unspecified: Secondary | ICD-10-CM | POA: Diagnosis present

## 2023-11-27 DIAGNOSIS — E66812 Obesity, class 2: Secondary | ICD-10-CM | POA: Diagnosis present

## 2023-11-27 LAB — HEMOGLOBIN A1C
Hgb A1c MFr Bld: 5.3 % (ref 4.8–5.6)
Mean Plasma Glucose: 105.41 mg/dL

## 2023-11-27 LAB — CBC WITH DIFFERENTIAL/PLATELET
Abs Immature Granulocytes: 0.04 K/uL (ref 0.00–0.07)
Basophils Absolute: 0 K/uL (ref 0.0–0.1)
Basophils Relative: 0 %
Eosinophils Absolute: 0.2 K/uL (ref 0.0–0.5)
Eosinophils Relative: 2 %
HCT: 40.1 % (ref 39.0–52.0)
Hemoglobin: 13.7 g/dL (ref 13.0–17.0)
Immature Granulocytes: 0 %
Lymphocytes Relative: 27 %
Lymphs Abs: 2.6 K/uL (ref 0.7–4.0)
MCH: 30.2 pg (ref 26.0–34.0)
MCHC: 34.2 g/dL (ref 30.0–36.0)
MCV: 88.5 fL (ref 80.0–100.0)
Monocytes Absolute: 0.7 K/uL (ref 0.1–1.0)
Monocytes Relative: 7 %
Neutro Abs: 6.1 K/uL (ref 1.7–7.7)
Neutrophils Relative %: 64 %
Platelets: 165 K/uL (ref 150–400)
RBC: 4.53 MIL/uL (ref 4.22–5.81)
RDW: 13.6 % (ref 11.5–15.5)
WBC: 9.7 K/uL (ref 4.0–10.5)
nRBC: 0 % (ref 0.0–0.2)

## 2023-11-27 LAB — COMPREHENSIVE METABOLIC PANEL WITH GFR
ALT: 17 U/L (ref 0–44)
AST: 22 U/L (ref 15–41)
Albumin: 3.4 g/dL — ABNORMAL LOW (ref 3.5–5.0)
Alkaline Phosphatase: 98 U/L (ref 38–126)
Anion gap: 12 (ref 5–15)
BUN: 12 mg/dL (ref 8–23)
CO2: 26 mmol/L (ref 22–32)
Calcium: 8.8 mg/dL — ABNORMAL LOW (ref 8.9–10.3)
Chloride: 101 mmol/L (ref 98–111)
Creatinine, Ser: 1.04 mg/dL (ref 0.61–1.24)
GFR, Estimated: >60 mL/min (ref >60)
Glucose, Bld: 100 mg/dL — ABNORMAL HIGH (ref 70–99)
Potassium: 4 mmol/L (ref 3.5–5.1)
Sodium: 139 mmol/L (ref 135–145)
Total Bilirubin: 1.5 mg/dL — ABNORMAL HIGH (ref 0.0–1.2)
Total Protein: 6.2 g/dL — ABNORMAL LOW (ref 6.5–8.1)

## 2023-11-27 LAB — CBC
HCT: 39.8 % (ref 39.0–52.0)
Hemoglobin: 13.9 g/dL (ref 13.0–17.0)
MCH: 30.2 pg (ref 26.0–34.0)
MCHC: 34.9 g/dL (ref 30.0–36.0)
MCV: 86.3 fL (ref 80.0–100.0)
Platelets: 190 K/uL (ref 150–400)
RBC: 4.61 MIL/uL (ref 4.22–5.81)
RDW: 13.3 % (ref 11.5–15.5)
WBC: 7.3 K/uL (ref 4.0–10.5)
nRBC: 0 % (ref 0.0–0.2)

## 2023-11-27 LAB — I-STAT CG4 LACTIC ACID, ED
Lactic Acid, Venous: 0.5 mmol/L (ref 0.5–1.9)
Lactic Acid, Venous: 1.2 mmol/L (ref 0.5–1.9)

## 2023-11-27 LAB — PROTIME-INR
INR: 1 (ref 0.8–1.2)
Prothrombin Time: 14 s (ref 11.4–15.2)

## 2023-11-27 LAB — GLUCOSE, CAPILLARY
Glucose-Capillary: 120 mg/dL — ABNORMAL HIGH (ref 70–99)
Glucose-Capillary: 130 mg/dL — ABNORMAL HIGH (ref 70–99)

## 2023-11-27 LAB — CBG MONITORING, ED: Glucose-Capillary: 217 mg/dL — ABNORMAL HIGH (ref 70–99)

## 2023-11-27 LAB — MAGNESIUM: Magnesium: 1.7 mg/dL (ref 1.7–2.4)

## 2023-11-27 LAB — CREATININE, SERUM
Creatinine, Ser: 1.11 mg/dL (ref 0.61–1.24)
GFR, Estimated: >60 mL/min (ref >60)

## 2023-11-27 MED ORDER — GABAPENTIN 300 MG PO CAPS
300.0000 mg | ORAL_CAPSULE | Freq: Every day | ORAL | Status: DC
  Administered 2023-11-27: 300 mg via ORAL
  Filled 2023-11-27: qty 1

## 2023-11-27 MED ORDER — METOPROLOL TARTRATE 50 MG PO TABS
50.0000 mg | ORAL_TABLET | Freq: Two times a day (BID) | ORAL | Status: DC
  Administered 2023-11-27 – 2023-11-28 (×3): 50 mg via ORAL
  Filled 2023-11-27: qty 1
  Filled 2023-11-27: qty 2
  Filled 2023-11-27: qty 1

## 2023-11-27 MED ORDER — GABAPENTIN 300 MG PO CAPS
300.0000 mg | ORAL_CAPSULE | ORAL | Status: DC
Start: 2023-11-27 — End: 2023-11-27

## 2023-11-27 MED ORDER — ALPRAZOLAM 0.5 MG PO TABS: 0.5000 mg | ORAL_TABLET | Freq: Three times a day (TID) | ORAL | Status: DC | PRN

## 2023-11-27 MED ORDER — PANTOPRAZOLE SODIUM 40 MG PO TBEC
40.0000 mg | DELAYED_RELEASE_TABLET | Freq: Every day | ORAL | Status: DC
  Administered 2023-11-27 – 2023-11-28 (×2): 40 mg via ORAL
  Filled 2023-11-27 (×2): qty 1

## 2023-11-27 MED ORDER — DEXAMETHASONE SODIUM PHOSPHATE 4 MG/ML IJ SOLN: 4.0000 mg | Freq: Two times a day (BID) | INTRAMUSCULAR | Status: DC

## 2023-11-27 MED ORDER — ONDANSETRON HCL 4 MG/2ML IJ SOLN: 4.0000 mg | Freq: Four times a day (QID) | INTRAMUSCULAR | Status: DC | PRN

## 2023-11-27 MED ORDER — INSULIN ASPART 100 UNIT/ML IJ SOLN: 0.0000 [IU] | Freq: Every day | INTRAMUSCULAR | Status: DC

## 2023-11-27 MED ORDER — SERTRALINE HCL 100 MG PO TABS
100.0000 mg | ORAL_TABLET | Freq: Every day | ORAL | Status: DC
  Administered 2023-11-27 – 2023-11-28 (×2): 100 mg via ORAL
  Filled 2023-11-27 (×2): qty 1

## 2023-11-27 MED ORDER — CLINDAMYCIN PHOSPHATE 900 MG/50ML IV SOLN
900.0000 mg | Freq: Three times a day (TID) | INTRAVENOUS | Status: DC
  Filled 2023-11-27: qty 50

## 2023-11-27 MED ORDER — SODIUM CHLORIDE 0.9 % IV SOLN
3.0000 g | Freq: Four times a day (QID) | INTRAVENOUS | Status: DC
  Administered 2023-11-27 – 2023-11-28 (×4): 3 g via INTRAVENOUS
  Filled 2023-11-27 (×5): qty 8

## 2023-11-27 MED ORDER — ENOXAPARIN SODIUM 40 MG/0.4ML IJ SOSY
40.0000 mg | PREFILLED_SYRINGE | INTRAMUSCULAR | Status: DC
  Administered 2023-11-27: 40 mg via SUBCUTANEOUS
  Filled 2023-11-27: qty 0.4

## 2023-11-27 MED ORDER — ASPIRIN 81 MG PO TBEC
81.0000 mg | DELAYED_RELEASE_TABLET | Freq: Every day | ORAL | Status: DC
  Administered 2023-11-27 – 2023-11-28 (×2): 81 mg via ORAL
  Filled 2023-11-27 (×2): qty 1

## 2023-11-27 MED ORDER — TAMSULOSIN HCL 0.4 MG PO CAPS
0.4000 mg | ORAL_CAPSULE | Freq: Every day | ORAL | Status: DC
  Administered 2023-11-27 – 2023-11-28 (×2): 0.4 mg via ORAL
  Filled 2023-11-27 (×2): qty 1

## 2023-11-27 MED ORDER — AMLODIPINE BESYLATE 5 MG PO TABS
5.0000 mg | ORAL_TABLET | Freq: Every day | ORAL | Status: DC
  Administered 2023-11-27 – 2023-11-28 (×2): 5 mg via ORAL
  Filled 2023-11-27 (×2): qty 1

## 2023-11-27 MED ORDER — DEXAMETHASONE SODIUM PHOSPHATE 10 MG/ML IJ SOLN
10.0000 mg | Freq: Once | INTRAMUSCULAR | Status: AC
  Administered 2023-11-27: 10 mg via INTRAVENOUS
  Filled 2023-11-27: qty 1

## 2023-11-27 MED ORDER — HYDROMORPHONE HCL 1 MG/ML IJ SOLN: 0.5000 mg | INTRAMUSCULAR | Status: DC | PRN

## 2023-11-27 MED ORDER — ALLOPURINOL 300 MG PO TABS
450.0000 mg | ORAL_TABLET | Freq: Every day | ORAL | Status: DC
  Administered 2023-11-27 – 2023-11-28 (×2): 450 mg via ORAL
  Filled 2023-11-27: qty 5
  Filled 2023-11-27: qty 2

## 2023-11-27 MED ORDER — POLYETHYLENE GLYCOL 3350 17 G PO PACK: 17.0000 g | PACK | Freq: Every day | ORAL | Status: DC | PRN

## 2023-11-27 MED ORDER — OXYCODONE HCL 5 MG PO TABS: 5.0000 mg | ORAL_TABLET | ORAL | Status: DC | PRN

## 2023-11-27 MED ORDER — LISINOPRIL 20 MG PO TABS
40.0000 mg | ORAL_TABLET | Freq: Every day | ORAL | Status: DC
  Administered 2023-11-27 – 2023-11-28 (×2): 40 mg via ORAL
  Filled 2023-11-27 (×2): qty 2

## 2023-11-27 MED ORDER — NALOXONE HCL 0.4 MG/ML IJ SOLN: 0.4000 mg | INTRAMUSCULAR | Status: DC | PRN

## 2023-11-27 MED ORDER — ATORVASTATIN CALCIUM 10 MG PO TABS
20.0000 mg | ORAL_TABLET | Freq: Every day | ORAL | Status: DC
  Administered 2023-11-27: 20 mg via ORAL
  Filled 2023-11-27: qty 2

## 2023-11-27 MED ORDER — GABAPENTIN 300 MG PO CAPS
600.0000 mg | ORAL_CAPSULE | Freq: Every day | ORAL | Status: DC
  Administered 2023-11-27 – 2023-11-28 (×2): 600 mg via ORAL
  Filled 2023-11-27 (×2): qty 2

## 2023-11-27 MED ORDER — ACETAMINOPHEN 325 MG PO TABS: 650.0000 mg | ORAL_TABLET | Freq: Four times a day (QID) | ORAL | Status: DC | PRN

## 2023-11-27 MED ORDER — ACETAMINOPHEN 650 MG RE SUPP: 650.0000 mg | Freq: Four times a day (QID) | RECTAL | Status: DC | PRN

## 2023-11-27 MED ORDER — INSULIN ASPART 100 UNIT/ML IJ SOLN
0.0000 [IU] | Freq: Three times a day (TID) | INTRAMUSCULAR | Status: DC
  Administered 2023-11-27: 5 [IU] via SUBCUTANEOUS
  Administered 2023-11-28: 2 [IU] via SUBCUTANEOUS

## 2023-11-27 MED ORDER — CLINDAMYCIN PHOSPHATE 900 MG/50ML IV SOLN
900.0000 mg | Freq: Once | INTRAVENOUS | Status: AC
  Administered 2023-11-27: 900 mg via INTRAVENOUS
  Filled 2023-11-27: qty 50

## 2023-11-27 MED ORDER — DEXAMETHASONE SODIUM PHOSPHATE 10 MG/ML IJ SOLN
8.0000 mg | Freq: Three times a day (TID) | INTRAMUSCULAR | Status: DC
  Administered 2023-11-27 – 2023-11-28 (×3): 8 mg via INTRAVENOUS
  Filled 2023-11-27 (×3): qty 1

## 2023-11-27 NOTE — Consult Note (Signed)
 ENT CONSULT:  Reason for Consult: submandibular gland stones and sialoadenitis    HPI: 75 yoM hx of salivary gland stones on the right side, who presents with submandibular gland swelling and pain for 2-3 days. He has a hx of similar sx in the past. He denies shortness of breath, trismus, trouble with swallowing. CT with a 1 cm small fluid collection near right SMG and calculi with sialoadenitis with reactive adenopathy. ENT was consulted for evaluation.    Records Reviewed:  ED note from today  Patient presents to the emergency department for evaluation of swelling of his neck. Symptoms began 3 days ago and have progressively worsened, now causing him to have difficulty swallowing.     Past Medical History:  Diagnosis Date   Anxiety    Arthritis    Bladder stone    Depression    Diabetes mellitus without complication (HCC)    GERD (gastroesophageal reflux disease)    Gout    History of kidney stones    Hypertension    Sleep apnea    CPAP    Past Surgical History:  Procedure Laterality Date   ANTERIOR CERVICAL DECOMP/DISCECTOMY FUSION N/A 02/15/2020   Procedure: Anterior Cervical Discectomy and Fusion Cervical Three-Cervical Four/Cervical Four-Cervical Five with Hardware Removal;  Surgeon: Joshua Alm RAMAN, MD;  Location: [facility] OR;  Service: Neurosurgery;  Laterality: N/A;  3C   BICEPS TENDON REPAIR Left    CERVICAL FUSION     EXTRACORPOREAL SHOCK WAVE LITHOTRIPSY Left 04/09/2022   Procedure: LEFT EXTRACORPOREAL SHOCK WAVE LITHOTRIPSY (ESWL);  Surgeon: Lovie Arlyss CROME, MD;  Location: [facility];  Service: Urology;  Laterality: Left;  75 MINUTES NEEDED FOR CASE   EXTRACORPOREAL SHOCK WAVE LITHOTRIPSY Left 05/04/2022   Procedure: LEFT EXTRACORPOREAL SHOCK WAVE LITHOTRIPSY (ESWL);  Surgeon: Selma Donnice SAUNDERS, MD;  [facility];  Service: Urology;  Laterality: Left;   HERNIA REPAIR Right    inguinal   LITHOTRIPSY     REVERSE SHOULDER ARTHROPLASTY  Right 06/23/2020   Procedure: REVERSE SHOULDER ARTHROPLASTY;  Surgeon: Melita Drivers, MD;  Location: [facility];  Service: Orthopedics;  Laterality: Right;    SHOULDER ARTHROSCOPY WITH ROTATOR CUFF REPAIR AND SUBACROMIAL DECOMPRESSION Left 07/29/2012   Procedure: LEFT SHOULDER ARTHROSCOPY WITH SUBACROMIAL DECOMPRESSION, DISTAL CLAVICLE RESECTION/ROTATOR CUFF REPAIR ;  Surgeon: Drivers CHRISTELLA Melita, MD;  Location: [facility] OR;  Service: Orthopedics;  Laterality: Left;   SHOULDER SURGERY     UMBILICAL HERNIA REPAIR      Family History  Problem Relation Age of Onset   Suicidality Mother    Lung disease Father     Social History:  reports that he has never smoked. His smokeless tobacco use includes snuff. He reports current alcohol use of about 3.0 - 4.0 standard drinks of alcohol per week. He reports that he does not currently use drugs after having used the following drugs: Crack cocaine and Marijuana.  Allergies:  Allergies  Allergen Reactions   Kiwi Extract Swelling    Tongue swelling   Strawberry Extract Swelling    Tongue swelling    Medications: I have reviewed the patient's current medications.  The PMH, PSH, Medications, Allergies, and SH were reviewed and updated.  ROS: Constitutional: Negative for fever, weight loss and weight gain. Cardiovascular: Negative for chest pain and dyspnea on exertion. Respiratory: Is not experiencing shortness of breath at rest. Gastrointestinal: Negative for nausea and vomiting. Neurological: Negative for headaches. Psychiatric: The patient is not nervous/anxious  Blood pressure  137/72, pulse (!) 59, temperature 98.3 F (36.8 C), resp. rate 11, weight 104 kg, SpO2 100%. Body mass index is 35.91 kg/m.  PHYSICAL EXAM: Exam: General: Well-developed, well-nourished Respiratory Respiratory effort: Equal inspiration and expiration without stridor Cardiovascular Peripheral Vascular: Warm extremities with equal color/perfusion Eyes: No nystagmus  with equal extraocular motion bilaterally Neuro/Psych/Balance: Patient oriented to person, place, and time; Appropriate mood and affect; Gait is intact with no imbalance; Cranial nerves I-XII are intact Head and Face Inspection: Normocephalic and atraumatic without mass or lesion Facial Strength: Facial motility symmetric and full bilaterally ENT Pinna: External ear intact and fully developed External canal: Canal is patent with intact skin Tympanic Membrane: Clear and mobile External Nose: No scar or anatomic deformity Internal Nose: Septum is relatively straight on anterior rhinoscopy. Bilateral inferior turbinate hypertrophy.  Lips, Teeth, and gums: Mucosa and teeth intact and viable Oral cavity/oropharynx: No erythema or exudate, no lesions present Palpable stones along Wharton duct No floor of mouth edema or erythema  No purulence noted  Neck Neck and Trachea: Midline trachea without mass or lesion Thyroid : No mass or nodularity Lymphatics: Palpable adenopathy in submental area  Swelling of the right submandibular gland  No skin erythema   Studies Reviewed: CT neck 11/26/23 IMPRESSION: 1. Findings consistent with acute right submandibular sialoadenitis with associated regional cellulitis. Two adjacent obstructive calculi within the distal right Wharton's duct measuring 5-6 mm each. 2. 13 x 10 x 9 mm rim enhancing collection just inferior to the right mandibular body, consistent with a small associated abscess. 3. Asymmetric prominence of subcentimeter right submandibular and submental lymph nodes, presumably reactive  Assessment/Plan: Salivary gland stones Sialoadenitis Cellulitis  1 cm collection near R SMG gland  Patient without floor of mouth swelling or induration on exam with enlarged R SMG gland and evidence of necrotic lymph node near R SMG gland, not amenable to drainage due to size being around 1 cm.    - collection near right submandibular gland is not  drainable due to small size no interventions needed at this time  - medical management of sialoadenitis including steroids, antibiotics, increased hydration, warm gland compresses and gentle gland massage to promote salivary flow. Could also try lemon cough drops as long as it does not worsen pain - Dex 8Q8 hrs for 1-2 days and then home on Medrol  pack - IV Unasyn  while here and home on PO Augmentin  x 14 days  - outpatient follow-up with me to discuss surgery for submandibular gland stones          Elena Larry, MD Otolaryngology [facility] Phone: [phone] Fax: [phone]    11/27/2023, 8:08 AM

## 2023-11-27 NOTE — H&P (Addendum)
 History and Physical    Zakkery Dorian. FMW:[id number] DOB: Jul 10, 1960 DOA: 11/26/2023  PCP: Toribio Jerel MATSU, MD  Patient coming from: home  I have personally briefly reviewed patient's old medical records in [facility]  Chief Complaint: right facial swelling  HPI: Macklin Jacquin. is Gautam Langhorst 63 y.o. male with medical history significant of sialodenitis, HTN, gout, T2DM and other medical issues here with R facial swelling.  Notes this started on Sunday night.  Has gradually worsened since then with associated pain.  Describes as aching.  No issues swallowing.  Had similar issue in July, was told to use sialogogues and warm compress, eventually passed Lajarvis Italiano stone.  Notes mild subjective fevers and chills.  No cough, no cold.  Mild nausea.  No CP or SOB.  No smoking.  Occasional etoh.   ED Course: Labs, imaging.  ENT consult.  Carryover admission.   Review of Systems: As per HPI otherwise all other systems reviewed and are negative.  Past Medical History:  Diagnosis Date   Anxiety    Arthritis    Bladder stone    Depression    Diabetes mellitus without complication (HCC)    GERD (gastroesophageal reflux disease)    Gout    History of kidney stones    Hypertension    Sleep apnea    CPAP    Past Surgical History:  Procedure Laterality Date   ANTERIOR CERVICAL DECOMP/DISCECTOMY FUSION N/Doni Bacha 02/15/2020   Procedure: Anterior Cervical Discectomy and Fusion Cervical Three-Cervical Four/Cervical Four-Cervical Five with Hardware Removal;  Surgeon: Joshua Alm RAMAN, MD;  Location: [facility] OR;  Service: Neurosurgery;  Laterality: N/Charolette Bultman;  3C   BICEPS TENDON REPAIR Left    CERVICAL FUSION     EXTRACORPOREAL SHOCK WAVE LITHOTRIPSY Left 04/09/2022   Procedure: LEFT EXTRACORPOREAL SHOCK WAVE LITHOTRIPSY (ESWL);  Surgeon: Lovie Arlyss CROME, MD;  Location: [facility];  Service: Urology;  Laterality: Left;  75 MINUTES NEEDED FOR CASE   EXTRACORPOREAL SHOCK WAVE LITHOTRIPSY Left 05/04/2022    Procedure: LEFT EXTRACORPOREAL SHOCK WAVE LITHOTRIPSY (ESWL);  Surgeon: Selma Donnice SAUNDERS, MD;  Location: [facility];  Service: Urology;  Laterality: Left;   HERNIA REPAIR Right    inguinal   LITHOTRIPSY     REVERSE SHOULDER ARTHROPLASTY Right 06/23/2020   Procedure: REVERSE SHOULDER ARTHROPLASTY;  Surgeon: Melita Drivers, MD;  Location: [facility];  Service: Orthopedics;  Laterality: Right;    SHOULDER ARTHROSCOPY WITH ROTATOR CUFF REPAIR AND SUBACROMIAL DECOMPRESSION Left 07/29/2012   Procedure: LEFT SHOULDER ARTHROSCOPY WITH SUBACROMIAL DECOMPRESSION, DISTAL CLAVICLE RESECTION/ROTATOR CUFF REPAIR ;  Surgeon: Drivers CHRISTELLA Melita, MD;  Location: [facility] OR;  Service: Orthopedics;  Laterality: Left;   SHOULDER SURGERY     UMBILICAL HERNIA REPAIR      Social History  reports that he has never smoked. His smokeless tobacco use includes snuff. He reports current alcohol use of about 3.0 - 4.0 standard drinks of alcohol per week. He reports that he does not currently use drugs after having used the following drugs: Crack cocaine and Marijuana.  Allergies  Allergen Reactions   Kiwi Extract Swelling    Tongue swelling   Strawberry Extract Swelling    Tongue swelling    Family History  Problem Relation Age of Onset   Suicidality Mother    Lung disease Father    Prior to Admission medications   Medication Sig Start Date End Date Taking? Authorizing Provider  allopurinol  (ZYLOPRIM ) 300 MG tablet Take  1 tablet (300 mg total) by mouth daily. Patient taking differently: Take 450 mg by mouth daily. 07/01/18  Yes Comer Kirsch, PA-C  ALPRAZolam  (XANAX ) 0.5 MG tablet Take 0.5 mg by mouth 3 (three) times daily as needed for anxiety.   Yes [provider]  amLODipine  (NORVASC ) 5 MG tablet Take 5 mg by mouth daily.   Yes [provider]  aspirin  EC 81 MG tablet Take 1 tablet (81 mg total) by mouth daily. Swallow whole. Patient taking differently: Take 81 mg by mouth at  bedtime. Swallow whole. 03/02/22  Yes Mallipeddi, Vishnu P, MD  atorvastatin  (LIPITOR) 20 MG tablet Take 1 tablet (20 mg total) by mouth daily. Patient taking differently: Take 20 mg by mouth at bedtime. 07/01/18  Yes Comer Kirsch, PA-C  gabapentin  (NEURONTIN ) 300 MG capsule Take 300-600 mg by mouth See admin instructions. Take 600 mg in the morning and 300 mg at night   Yes [provider]  ibuprofen (ADVIL) 200 MG tablet Take 800 mg by mouth every 6 (six) hours as needed for fever, headache or mild pain (pain score 1-3).   Yes [provider]  lisinopril  (ZESTRIL ) 40 MG tablet Take 40 mg by mouth daily.   Yes [provider]  Menthol , Topical Analgesic, (BIOFREEZE EX) Apply 1 application topically daily as needed (pain).   Yes [provider]  Menthol -Methyl Salicylate (SALONPAS PAIN RELIEF PATCH) 3-10 % PTCH Apply 1 patch topically every 12 (twelve) hours as needed (pain).   Yes [provider]  metFORMIN  (GLUCOPHAGE -XR) 500 MG 24 hr tablet Take 1,000 mg by mouth at bedtime. 12/07/19  Yes [provider]  metoprolol  tartrate (LOPRESSOR ) 50 MG tablet Take 50 mg by mouth in the morning and at bedtime. 11/18/23  Yes [provider]  nitroGLYCERIN  (NITROSTAT ) 0.4 MG SL tablet Place 1 tablet (0.4 mg total) under the tongue every 5 (five) minutes x 3 doses as needed for chest pain (if no relief after 3rd dose, proceed to ED or call 911). 03/02/22  Yes Mallipeddi, Vishnu P, MD  NON FORMULARY 1 tablet daily. Super Beta Prostate   Yes [provider]  pantoprazole  (PROTONIX ) 40 MG tablet Take 40 mg by mouth daily. 02/09/22  Yes [provider]  sertraline  (ZOLOFT ) 100 MG tablet Take 100 mg by mouth daily.    Yes [provider]  tamsulosin  (FLOMAX ) 0.4 MG CAPS capsule Take 1 capsule (0.4 mg total) by mouth daily. 07/01/18  Yes Comer Kirsch, PA-C  amoxicillin -clavulanate (AUGMENTIN ) 875-125 MG tablet Take 1 tablet by  mouth 2 (two) times daily. Patient not taking: Reported on 11/27/2023 11/26/23   [provider]    Physical Exam: Vitals:   11/27/23 0600 11/27/23 0615 11/27/23 0700 11/27/23 0745  BP: 137/77 137/65 (!) 158/94 137/72  Pulse: 62 (!) 58 61 (!) 59  Resp:  18 11 11   Temp:   98.3 F (36.8 C)   SpO2: 97% 96% 99% 100%  Weight:        Constitutional: NAD, calm, comfortable Vitals:   11/27/23 0600 11/27/23 0615 11/27/23 0700 11/27/23 0745  BP: 137/77 137/65 (!) 158/94 137/72  Pulse: 62 (!) 58 61 (!) 59  Resp:  18 11 11   Temp:   98.3 F (36.8 C)   SpO2: 97% 96% 99% 100%  Weight:       Eyes: PERRL, lids and conjunctivae normal ENMT: R sided submandibular swelling, TTP Neck: normal, supple Respiratory: clear to auscultation bilaterally, no wheezing, no crackles.  Cardiovascular: Regular rate and rhythm, no murmurs / rubs / gallops. No extremity edema.  Abdomen: no tenderness, no masses palpated. No hepatosplenomegaly. Bowel sounds positive.  Musculoskeletal: no clubbing / cyanosis. No joint deformity upper and lower extremities. Good ROM, no contractures. Normal muscle tone.  Skin: no rashes, lesions, ulcers. No induration Neurologic: CN 2-12 grossly intact. Moving all extremities. Psychiatric: Normal judgment and insight. Alert and oriented x 3. Normal mood.   Labs on Admission: I have personally reviewed following labs and imaging studies  CBC: Recent Labs  Lab 11/26/23 1624 11/27/23 0452  WBC 10.9* 9.7  NEUTROABS 7.1 6.1  HGB 14.8 13.7  HCT 42.6 40.1  MCV 88.0 88.5  PLT 195 165    Basic Metabolic Panel: Recent Labs  Lab 11/26/23 1624 11/27/23 0452  NA 139 139  K 3.9 4.0  CL 102 101  CO2 26 26  GLUCOSE 106* 100*  BUN 11 12  CREATININE 1.00 1.04  CALCIUM  9.0 8.8*  MG  --  1.7    GFR: Estimated Creatinine Clearance: 84.7 mL/min (by C-G formula based on SCr of 1.04 mg/dL).  Liver Function Tests: Recent Labs  Lab 11/27/23 0452  AST 22  ALT 17   ALKPHOS 98  BILITOT 1.5*  PROT 6.2*  ALBUMIN 3.4*    Urine analysis:    Component Value Date/Time   COLORURINE YELLOW 10/17/2011 1442   APPEARANCEUR CLEAR 10/17/2011 1442   LABSPEC 1.024 10/17/2011 1442   PHURINE 5.0 10/17/2011 1442   GLUCOSEU NEGATIVE 10/17/2011 1442   HGBUR NEGATIVE 10/17/2011 1442   BILIRUBINUR NEGATIVE 10/17/2011 1442   KETONESUR NEGATIVE 10/17/2011 1442   PROTEINUR NEGATIVE 10/17/2011 1442   UROBILINOGEN 0.2 10/17/2011 1442   NITRITE NEGATIVE 10/17/2011 1442   LEUKOCYTESUR NEGATIVE 10/17/2011 1442    Radiological Exams on Admission: CT Soft Tissue Neck W Contrast Result Date: 11/26/2023 CLINICAL DATA:  Initial evaluation for acute submandibular swelling. EXAM: CT NECK WITH CONTRAST TECHNIQUE: Multidetector CT imaging of the neck was performed using the standard protocol following the bolus administration of intravenous contrast. RADIATION DOSE REDUCTION: This exam was performed according to the departmental dose-optimization program which includes automated exposure control, adjustment of the mA and/or kV according to patient size and/or use of iterative reconstruction technique. CONTRAST:  75mL OMNIPAQUE  IOHEXOL  350 MG/ML SOLN COMPARISON:  CT from 04/10/2021. FINDINGS: Pharynx and larynx: Oral cavity within normal limits. Oropharynx and nasopharynx within normal limits. No retropharyngeal collection or swelling. Negative epiglottis. Hypopharynx and supraglottic larynx within normal limits. Negative glottis. Subglottic airway clear. Salivary glands: Parotid and left submandibular glands are normal. Heterogeneity Laura-Lee Villegas with hyperenhancement about the right submandibular gland, consistent with acute sialoadenitis. Associated surrounding soft tissue swelling with inflammatory stranding, consistent with regional cellulitis. There are 2 adjacent obstructive calculi within the distal right Wharton's duct measuring 5-6 mm each (series 4, image 55). Small rim enhancing collection  measuring 13 x 10 x 9 mm just inferior to the right mandibular body suspicious for Alaura Schippers small associated abscess (series 3, image 62). Thyroid : Partially obscured by streak artifact from fusion hardware. Grossly unremarkable. Lymph nodes: Asymmetric prominence of subcentimeter right submandibular and submental lymph nodes, presumably reactive. Vascular: Normal intravascular enhancement seen within the neck. Mild vascular calcifications noted about the carotid bifurcations and skull base. Limited intracranial: Unremarkable. Visualized orbits: Unremarkable. Mastoids and visualized paranasal sinuses: Mild mucosal thickening about the inferior right maxillary sinus. Visualized paranasal sinuses are otherwise clear. Mastoid air cells and middle ear cavities are well pneumatized and free  of fluid. Skeleton: No worrisome osseous lesions. Prior ACDF at C3 through C7. Upper chest: No other acute finding. Other: None. IMPRESSION: 1. Findings consistent with acute right submandibular sialoadenitis with associated regional cellulitis. Two adjacent obstructive calculi within the distal right Wharton's duct measuring 5-6 mm each. 2. 13 x 10 x 9 mm rim enhancing collection just inferior to the right mandibular body, consistent with Jamisen Hawes small associated abscess. 3. Asymmetric prominence of subcentimeter right submandibular and submental lymph nodes, presumably reactive Electronically Signed   By: Morene Hoard M.D.   On: 11/26/2023 19:05    EKG: Independently reviewed. pending  Assessment/Plan Principal Problem:   Submandibular abscess   Assessment and Plan:  Submandibular Abscess Acute Right Submandibular Sialoadenitis with Associated Regional Cellulitis Adjacent Obstructive Calculi within Distal R Wharton's Duct  CT findings as above Blood cultures pending Appreciate ENT eval, no plan for surgical intervention - recommending unasyn , dex (when ready to d/c can send on medrol  dose pack and 14 days of augmentin ) -  outpatient follow up with ENT Pain management with apap, oxycodone   HTN Continue amlodipine , lisinopril    T2DM Peripheral Neuropathy Recent A1c in prediabetes range Repeat A1c, follow BG's - expect steroid induced hyperglycemia - SSI ordered Holding metformin  for now gabapentin   Dyslipidemia ASA, statin  Depression  Anxiety Zoloft , xanax    BPH Flomax   GERD PPI   Obesity, Class II Body mass index is 35.91 kg/m.      DVT prophylaxis: lovenox   Code Status:   full  Family Communication:  none  Disposition Plan:   Patient is from:  home  Anticipated DC to:  home  Anticipated DC date:  1-2 days  Anticipated DC barriers: Improvement in cellulitis, submandibular swelling  Consults called:  ENT  Admission status:  inpatient   Severity of Illness: The appropriate patient status for this patient is INPATIENT. Inpatient status is judged to be reasonable and necessary in order to provide the required intensity of service to ensure the patient's safety. The patient's presenting symptoms, physical exam findings, and initial radiographic and laboratory data in the context of their chronic comorbidities is felt to place them at high risk for further clinical deterioration. Furthermore, it is not anticipated that the patient will be medically stable for discharge from the hospital within 2 midnights of admission.   * I certify that at the point of admission it is my clinical judgment that the patient will require inpatient hospital care spanning beyond 2 midnights from the point of admission due to high intensity of service, high risk for further deterioration and high frequency of surveillance required.DEWAINE Meliton Monte MD [facility]  How to contact the [facility] Attending or Consulting provider 7A - 7P or covering provider during after hours 7P -7A, for this patient?   Check the care team in [facility] and look for Jameila Keeny) attending/consulting [facility] provider listed and b) the [facility] team  listed Log into [email or web address] and use [place]'s universal password to access. If you do not have the password, please contact the hospital operator. Locate the [facility] provider you are looking for under [facility] and page to [place] [employer] number that you can be directly reached. If you still have difficulty reaching the provider, please page the [facility] (Director on Call) for the Hospitalists listed on amion for assistance.  11/27/2023, 8:12 AM

## 2023-11-27 NOTE — Progress Notes (Signed)
  Carryover admission to the Day Admitter.  I discussed this case with the EDP, Dr. Haze.  Per these discussions:   This is a 63 year old male who is being admitted for  submandibular abscess as well as salivary ductal stone presenting with new onset submandibular discomfort over the last few days.  CT shows submandibular abscess, swelling, as well as ductal stone.  Patient notes some pain with swallowing, but no stridor, shortness of breath.  Per my discussions with EDP, no evidence of airway compromise.   Patient noted history of salivary ductal stone 2 years ago, which resolved with conservative measures, including lemon drops.   EDP d/w on-call ENT, Dr. Soldatova, who will formally consult/see pt in the AM; in the meantime, she requests iv abx and decadron .   He received a dose of IV clindamycin  as well as Decadron  10 mg IV x 1 in the ED.  I have placed an order for observation to med/tele for further evaluation management of the above.  I have placed some additional preliminary admit orders via the adult multi-morbid admission order set. I have also ordered n.p.o., prn IV Zofran , prn IV Dilaudid , additional IV clindamycin , as well as EKG.  I also ordered morning labs in the form of CMP, CBC, magnesium level and INR.    Eva Pore, DO Hospitalist

## 2023-11-27 NOTE — ED Provider Notes (Signed)
 [place] EMERGENCY DEPARTMENT AT [facility] Provider Note   CSN: [id number] Arrival date & time: 11/26/23  1613     Patient presents with: Oral Swelling   [name]. is a 63 y.o. male.   Patient presents to the emergency department for evaluation of swelling of his neck.  Symptoms began 3 days ago and have progressively worsened, now causing him to have difficulty swallowing.       Prior to Admission medications   Medication Sig Start Date End Date Taking? Authorizing Provider  acetaminophen  (TYLENOL ) 650 MG CR tablet Take 650 mg by mouth every 8 (eight) hours as needed for pain.    Provider, Historical, [name]  allopurinol  (ZYLOPRIM ) 300 MG tablet Take 1 tablet (300 mg total) by mouth daily. Patient taking differently: Take 450 mg by mouth daily. 07/01/18   Tommy Kirsch, [name]  ALPRAZolam  (XANAX ) 0.5 MG tablet Take 0.5 mg by mouth 3 (three) times daily as needed for anxiety.    Provider, Historical, [name]  amLODipine  (NORVASC ) 5 MG tablet Take 5 mg by mouth daily.    Provider, Historical, [name]  aspirin  EC 81 MG tablet Take 1 tablet (81 mg total) by mouth daily. Swallow whole. 03/02/22   Mallipeddi, Tommy P, [name]  atorvastatin  (LIPITOR) 20 MG tablet Take 1 tablet (20 mg total) by mouth daily. 07/01/18   Tommy Kirsch, [name]  famotidine (PEPCID) 40 MG tablet Take 40 mg by mouth daily. 02/05/22   Provider, Historical, [name]  gabapentin  (NEURONTIN ) 300 MG capsule Take 300-600 mg by mouth See admin instructions. Take 600 mg in the morning and 300 mg at night    Provider, Historical, [name]  lisinopril  (ZESTRIL ) 40 MG tablet Take 40 mg by mouth daily.    Provider, Historical, [name]  Menthol , Topical Analgesic, (BIOFREEZE EX) Apply 1 application topically daily as needed (pain).    Provider, Historical, [name]  metFORMIN  (GLUCOPHAGE -XR) 500 MG 24 hr tablet Take 1,000 mg by mouth at bedtime. 12/07/19   Provider, Historical, [name]  metoprolol  tartrate (LOPRESSOR ) 25 MG tablet Take 1 tablet (25  mg total) by mouth 2 (two) times daily. 10/14/23   Mallipeddi, Tommy P, [name]  nitroGLYCERIN  (NITROSTAT ) 0.4 MG SL tablet Place 1 tablet (0.4 mg total) under the tongue every 5 (five) minutes x 3 doses as needed for chest pain (if no relief after 3rd dose, proceed to ED or call 911). 03/02/22   Mallipeddi, Tommy SQUIBB, [name]  NON FORMULARY 1 tablet daily. Super Beta Prostate    Provider, Historical, [name]  ondansetron  (ZOFRAN ) 4 MG tablet Take 1 tablet (4 mg total) by mouth every 8 (eight) hours as needed for nausea or vomiting. 06/23/20   Shuford, Randine, [name]  oxyCODONE -acetaminophen  (PERCOCET) 5-325 MG tablet Take 1 tablet by mouth every 4 (four) hours as needed for up to 20 doses (max 6 q). 05/04/22   Tommy Donnice SAUNDERS, [name]  pantoprazole  (PROTONIX ) 40 MG tablet Take 40 mg by mouth daily. 02/09/22   Provider, Historical, [name]  sertraline  (ZOLOFT ) 100 MG tablet Take 100 mg by mouth daily.     Provider, Historical, [name]  tamsulosin  (FLOMAX ) 0.4 MG CAPS capsule Take 1 capsule (0.4 mg total) by mouth daily. 07/01/18   Tommy Kirsch, [name]    Allergies: Kiwi extract and Strawberry extract    Review of Systems  Updated Vital Signs BP (!) 166/90 (BP Location: Left Arm)   Pulse (!) 57   Temp 98.6 F (37 C)   Resp 14  Wt 104 kg   SpO2 96%   BMI 35.91 kg/m   Physical Exam Vitals and nursing note reviewed.  Constitutional:      General: He is not in acute distress.    Appearance: He is well-developed.  HENT:     Head: Normocephalic and atraumatic.     Mouth/Throat:     Mouth: Mucous membranes are moist.  Eyes:     General: Vision grossly intact. Gaze aligned appropriately.     Extraocular Movements: Extraocular movements intact.     Conjunctiva/sclera: Conjunctivae normal.  Neck:   Cardiovascular:     Rate and Rhythm: Normal rate and regular rhythm.     Pulses: Normal pulses.     Heart sounds: Normal heart sounds, S1 normal and S2 normal. No murmur heard.    No friction rub. No gallop.  Pulmonary:      Effort: Pulmonary effort is normal. No respiratory distress.     Breath sounds: Normal breath sounds.  Abdominal:     Palpations: Abdomen is soft.     Tenderness: There is no abdominal tenderness. There is no guarding or rebound.     Hernia: No hernia is present.  Musculoskeletal:        General: No swelling.     Cervical back: Full passive range of motion without pain, normal range of motion and neck supple. No pain with movement, spinous process tenderness or muscular tenderness. Normal range of motion.     Right lower leg: No edema.     Left lower leg: No edema.  Skin:    General: Skin is warm and dry.     Capillary Refill: Capillary refill takes less than 2 seconds.     Findings: No ecchymosis, erythema, lesion or wound.  Neurological:     Mental Status: He is alert and oriented to person, place, and time.     GCS: GCS eye subscore is 4. GCS verbal subscore is 5. GCS motor subscore is 6.     Cranial Nerves: Cranial nerves 2-12 are intact.     Sensory: Sensation is intact.     Motor: Motor function is intact. No weakness or abnormal muscle tone.     Coordination: Coordination is intact.  Psychiatric:        Mood and Affect: Mood normal.        Speech: Speech normal.        Behavior: Behavior normal.     (all labs ordered are listed, but only abnormal results are displayed) Labs Reviewed  CBC WITH DIFFERENTIAL/PLATELET - Abnormal; Notable for the following components:      Result Value   WBC 10.9 (*)    All other components within normal limits  BASIC METABOLIC PANEL WITH GFR - Abnormal; Notable for the following components:   Glucose, Bld 106 (*)    All other components within normal limits  CBG MONITORING, ED - Abnormal; Notable for the following components:   Glucose-Capillary 108 (*)    All other components within normal limits  CULTURE, BLOOD (ROUTINE X 2)  CULTURE, BLOOD (ROUTINE X 2)  I-STAT CG4 LACTIC ACID, ED    EKG: None  Radiology: CT Soft Tissue Neck W  Contrast Result Date: 11/26/2023 CLINICAL DATA:  Initial evaluation for acute submandibular swelling. EXAM: CT NECK WITH CONTRAST TECHNIQUE: Multidetector CT imaging of the neck was performed using the standard protocol following the bolus administration of intravenous contrast. RADIATION DOSE REDUCTION: This exam was performed according to the departmental dose-optimization program  which includes automated exposure control, adjustment of the mA and/or kV according to patient size and/or use of iterative reconstruction technique. CONTRAST:  75mL OMNIPAQUE  IOHEXOL  350 MG/ML SOLN COMPARISON:  CT from 04/10/2021. FINDINGS: Pharynx and larynx: Oral cavity within normal limits. Oropharynx and nasopharynx within normal limits. No retropharyngeal collection or swelling. Negative epiglottis. Hypopharynx and supraglottic larynx within normal limits. Negative glottis. Subglottic airway clear. Salivary glands: Parotid and left submandibular glands are normal. Heterogeneity a with hyperenhancement about the right submandibular gland, consistent with acute sialoadenitis. Associated surrounding soft tissue swelling with inflammatory stranding, consistent with regional cellulitis. There are 2 adjacent obstructive calculi within the distal right Wharton's duct measuring 5-6 mm each (series 4, image 55). Small rim enhancing collection measuring 13 x 10 x 9 mm just inferior to the right mandibular body suspicious for a small associated abscess (series 3, image 62). Thyroid : Partially obscured by streak artifact from fusion hardware. Grossly unremarkable. Lymph nodes: Asymmetric prominence of subcentimeter right submandibular and submental lymph nodes, presumably reactive. Vascular: Normal intravascular enhancement seen within the neck. Mild vascular calcifications noted about the carotid bifurcations and skull base. Limited intracranial: Unremarkable. Visualized orbits: Unremarkable. Mastoids and visualized paranasal sinuses: Mild  mucosal thickening about the inferior right maxillary sinus. Visualized paranasal sinuses are otherwise clear. Mastoid air cells and middle ear cavities are well pneumatized and free of fluid. Skeleton: No worrisome osseous lesions. Prior ACDF at C3 through C7. Upper chest: No other acute finding. Other: None. IMPRESSION: 1. Findings consistent with acute right submandibular sialoadenitis with associated regional cellulitis. Two adjacent obstructive calculi within the distal right Wharton's duct measuring 5-6 mm each. 2. 13 x 10 x 9 mm rim enhancing collection just inferior to the right mandibular body, consistent with a small associated abscess. 3. Asymmetric prominence of subcentimeter right submandibular and submental lymph nodes, presumably reactive Electronically Signed   By: Morene Hoard M.D.   On: 11/26/2023 19:05     Procedures   Medications Ordered in the ED  clindamycin  (CLEOCIN ) IVPB 900 mg (has no administration in time range)  iohexol  (OMNIPAQUE ) 350 MG/ML injection 75 mL (75 mLs Intravenous Contrast Given 11/26/23 1816)                                    Medical Decision Making Amount and/or Complexity of Data Reviewed External Data Reviewed: labs and notes. Labs: ordered. Decision-making details documented in ED Course. Radiology: ordered and independent interpretation performed. Decision-making details documented in ED Course.  Risk Prescription drug management.   Differential diagnosis considered includes, but not limited to: Neck abscess; goiter; dental abscess; sialoadenitis  Presents to the emergency department for evaluation of progressively worsening tenderness, swelling under the mandible.  Patient reports he had something similar in the past.  Record review reveals a history of sialoadenitis and sialolithiasis that spontaneously resolved with antibiotics.  Patient reports that today symptoms are far worse.  CT scan does confirm significant submandibular  sialoadenitis, sialolithiasis and a small submandibular gland abscess.  Patient with patent airway.  Swelling is causing some difficulty with swallowing.  Discussed with Dr. Soldatova, on-call for ENT.  Agrees with antibiotics, asks for addition of steroids.  Patient given clindamycin  and Decadron .  ENT will consult.  Admit to hospitalist service.     Final diagnoses:  Sialadenitis  Sialolithiasis of submandibular gland    ED Discharge Orders     None  Tommy Lonni PARAS, [name] 11/27/23 [phone]

## 2023-11-28 ENCOUNTER — Other Ambulatory Visit (HOSPITAL_COMMUNITY): Payer: Self-pay

## 2023-11-28 LAB — CBC
HCT: 40 % (ref 39.0–52.0)
Hemoglobin: 14.1 g/dL (ref 13.0–17.0)
MCH: 30.6 pg (ref 26.0–34.0)
MCHC: 35.3 g/dL (ref 30.0–36.0)
MCV: 86.8 fL (ref 80.0–100.0)
Platelets: 212 K/uL (ref 150–400)
RBC: 4.61 MIL/uL (ref 4.22–5.81)
RDW: 13.4 % (ref 11.5–15.5)
WBC: 11.7 K/uL — ABNORMAL HIGH (ref 4.0–10.5)
nRBC: 0 % (ref 0.0–0.2)

## 2023-11-28 LAB — COMPREHENSIVE METABOLIC PANEL WITH GFR
ALT: 17 U/L (ref 0–44)
AST: 17 U/L (ref 15–41)
Albumin: 3.4 g/dL — ABNORMAL LOW (ref 3.5–5.0)
Alkaline Phosphatase: 90 U/L (ref 38–126)
Anion gap: 12 (ref 5–15)
BUN: 19 mg/dL (ref 8–23)
CO2: 24 mmol/L (ref 22–32)
Calcium: 9.4 mg/dL (ref 8.9–10.3)
Chloride: 104 mmol/L (ref 98–111)
Creatinine, Ser: 1.19 mg/dL (ref 0.61–1.24)
GFR, Estimated: >60 mL/min (ref >60)
Glucose, Bld: 139 mg/dL — ABNORMAL HIGH (ref 70–99)
Potassium: 4.2 mmol/L (ref 3.5–5.1)
Sodium: 140 mmol/L (ref 135–145)
Total Bilirubin: 0.8 mg/dL (ref 0.0–1.2)
Total Protein: 6.4 g/dL — ABNORMAL LOW (ref 6.5–8.1)

## 2023-11-28 LAB — GLUCOSE, CAPILLARY: Glucose-Capillary: 125 mg/dL — ABNORMAL HIGH (ref 70–99)

## 2023-11-28 MED ORDER — METHYLPREDNISOLONE 4 MG PO TBPK
ORAL_TABLET | ORAL | 0 refills | Status: AC
  Filled 2023-11-28: qty 21, 6d supply, fill #0

## 2023-11-28 MED ORDER — OXYCODONE HCL 5 MG PO TABS
5.0000 mg | ORAL_TABLET | Freq: Four times a day (QID) | ORAL | 0 refills | Status: AC | PRN
  Filled 2023-11-28: qty 20, 5d supply, fill #0

## 2023-11-28 MED ORDER — AMOXICILLIN-POT CLAVULANATE 875-125 MG PO TABS
1.0000 | ORAL_TABLET | Freq: Two times a day (BID) | ORAL | 0 refills | Status: AC
  Filled 2023-11-28 (×2): qty 26, 13d supply, fill #0

## 2023-11-28 NOTE — Plan of Care (Signed)

## 2023-11-28 NOTE — Progress Notes (Signed)
 Reviewed AVS, patient expressed understanding of medications, MD follow up reviewed.   Removed IV, Site clean, dry and intact.  CCMD contacted and informed patients is being discharged.  Patient states all belongings brought to the hospital at time of admission are accounted for and packed to take home.  Picked up medications from [facility] pharmacy. [facility] contacted to transport patient to Discharge lounge to wait for transportation home.

## 2023-11-28 NOTE — Discharge Summary (Signed)
 Physician Discharge Summary  [name]. FMW:[id number] DOB: 09/28/1960 DOA: 11/26/2023  PCP: Toribio Jerel MATSU, MD  Admit date: 11/26/2023 Discharge date: 11/28/2023  Admitted From: home Disposition:  home  Recommendations for Outpatient Follow-up:  Follow up with PCP in 1-2 weeks Follow up with Dr Soldatova in 2 weeks  Home Health: none Equipment/Devices: none  Discharge Condition: stable CODE STATUS: Full code Diet Orders (From admission, onward)     Start     Ordered   11/27/23 0858  Diet heart healthy/carb modified Room service appropriate? Yes; Fluid consistency: Thin  Diet effective now       Question Answer Comment  Diet-HS Snack? Nothing   Room service appropriate? Yes   Fluid consistency: Thin      11/27/23 0857           HPI: Per admitting MD, [name]. is a 63 y.o. male with medical history significant of sialodenitis, HTN, gout, T2DM and other medical issues here with R facial swelling. Notes this started on Sunday night.  Has gradually worsened since then with associated pain.  Describes as aching.  No issues swallowing.  Had similar issue in July, was told to use sialogogues and warm compress, eventually passed a stone.  Notes mild subjective fevers and chills.  No cough, no cold.  Mild nausea.  No CP or SOB.  No smoking.  Occasional etoh.   Hospital Course / Discharge diagnoses: Principal problem Submandibular Abscess Acute Right Submandibular Sialoadenitis with Associated Regional Cellulitis Adjacent Obstructive Calculi within Distal R Wharton's Duct  - CT findings as below, ENT consulted and followed patient while hospitalized, it was felt like the collection near the right submandibular gland is not drainable due to small size.  ENT recommended conservative management with steroids, antibiotics, pain control.  He is now having significant improvement, swelling is almost completely resolved and he is feeling better.  He is having less pain  and able to tolerate a regular diet.  With clinical improvement, will be transition to Augmentin  for a total of 14 days antibiotics and a Medrol  Dosepak as recommended by ENT.  He will be discharged home in stable condition and was recommended to follow-up with ENT within 2 weeks   Active problems HTN - Continue home medications  T2DM -last A1c in the 5 range, he is not overtly hyperglycemic on steroids, resume home metformin  Dyslipidemia - ASA, statin Depression  Anxiety - continue home medications  BPH - Flomax  Obesity, class 1 - BMI 34  Sepsis ruled out   Discharge Instructions   Allergies as of 11/28/2023       Reactions   Kiwi Extract Swelling   Tongue swelling   Strawberry Extract Swelling   Tongue swelling        Medication List     TAKE these medications    allopurinol  300 MG tablet Commonly known as: ZYLOPRIM  Take 1 tablet (300 mg total) by mouth daily. What changed: how much to take   ALPRAZolam  0.5 MG tablet Commonly known as: XANAX  Take 0.5 mg by mouth 3 (three) times daily as needed for anxiety.   amLODipine  5 MG tablet Commonly known as: NORVASC  Take 5 mg by mouth daily.   amoxicillin -clavulanate 875-125 MG tablet Commonly known as: AUGMENTIN  Take 1 tablet by mouth 2 (two) times daily for 13 days.   aspirin  EC 81 MG tablet Take 1 tablet (81 mg total) by mouth daily. Swallow whole. What changed: when to take this  atorvastatin  20 MG tablet Commonly known as: LIPITOR Take 1 tablet (20 mg total) by mouth daily. What changed: when to take this   BIOFREEZE EX Apply 1 application topically daily as needed (pain).   gabapentin  300 MG capsule Commonly known as: NEURONTIN  Take 300-600 mg by mouth See admin instructions. Take 600 mg in the morning and 300 mg at night   ibuprofen 200 MG tablet Commonly known as: ADVIL Take 800 mg by mouth every 6 (six) hours as needed for fever, headache or mild pain (pain score 1-3).   lisinopril  40 MG  tablet Commonly known as: ZESTRIL  Take 40 mg by mouth daily.   metFORMIN  500 MG 24 hr tablet Commonly known as: GLUCOPHAGE -XR Take 1,000 mg by mouth at bedtime.   methylPREDNISolone  4 MG Tbpk tablet Commonly known as: MEDROL  DOSEPAK Take 6 tablets (24 mg total) by mouth daily for 1 day, THEN 5 tablets (20 mg total) daily for 1 day, THEN 4 tablets (16 mg total) daily for 1 day, THEN 3 tablets (12 mg total) daily for 1 day, THEN 2 tablets (8 mg total) daily for 1 day, THEN 1 tablet (4 mg total) daily for 1 day. Start taking on: November 28, 2023   metoprolol  tartrate 50 MG tablet Commonly known as: LOPRESSOR  Take 50 mg by mouth in the morning and at bedtime.   nitroGLYCERIN  0.4 MG SL tablet Commonly known as: NITROSTAT  Place 1 tablet (0.4 mg total) under the tongue every 5 (five) minutes x 3 doses as needed for chest pain (if no relief after 3rd dose, proceed to ED or call 911).   NON FORMULARY 1 tablet daily. Super Beta Prostate   oxyCODONE  5 MG immediate release tablet Commonly known as: Oxy IR/ROXICODONE  Take 1 tablet (5 mg total) by mouth every 6 (six) hours as needed for moderate pain (pain score 4-6).   pantoprazole  40 MG tablet Commonly known as: PROTONIX  Take 40 mg by mouth daily.   Salonpas Pain Relief Patch 3-10 % Ptch Apply 1 patch topically every 12 (twelve) hours as needed (pain).   sertraline  100 MG tablet Commonly known as: ZOLOFT  Take 100 mg by mouth daily.   tamsulosin  0.4 MG Caps capsule Commonly known as: FLOMAX  Take 1 capsule (0.4 mg total) by mouth daily.       Consultations: ENT  Procedures/Studies:  CT Soft Tissue Neck W Contrast Result Date: 11/26/2023 CLINICAL DATA:  Initial evaluation for acute submandibular swelling. EXAM: CT NECK WITH CONTRAST TECHNIQUE: Multidetector CT imaging of the neck was performed using the standard protocol following the bolus administration of intravenous contrast. RADIATION DOSE REDUCTION: This exam was  performed according to the departmental dose-optimization program which includes automated exposure control, adjustment of the mA and/or kV according to patient size and/or use of iterative reconstruction technique. CONTRAST:  75mL OMNIPAQUE  IOHEXOL  350 MG/ML SOLN COMPARISON:  CT from 04/10/2021. FINDINGS: Pharynx and larynx: Oral cavity within normal limits. Oropharynx and nasopharynx within normal limits. No retropharyngeal collection or swelling. Negative epiglottis. Hypopharynx and supraglottic larynx within normal limits. Negative glottis. Subglottic airway clear. Salivary glands: Parotid and left submandibular glands are normal. Heterogeneity a with hyperenhancement about the right submandibular gland, consistent with acute sialoadenitis. Associated surrounding soft tissue swelling with inflammatory stranding, consistent with regional cellulitis. There are 2 adjacent obstructive calculi within the distal right Wharton's duct measuring 5-6 mm each (series 4, image 55). Small rim enhancing collection measuring 13 x 10 x 9 mm just inferior to the right mandibular body suspicious  for a small associated abscess (series 3, image 62). Thyroid : Partially obscured by streak artifact from fusion hardware. Grossly unremarkable. Lymph nodes: Asymmetric prominence of subcentimeter right submandibular and submental lymph nodes, presumably reactive. Vascular: Normal intravascular enhancement seen within the neck. Mild vascular calcifications noted about the carotid bifurcations and skull base. Limited intracranial: Unremarkable. Visualized orbits: Unremarkable. Mastoids and visualized paranasal sinuses: Mild mucosal thickening about the inferior right maxillary sinus. Visualized paranasal sinuses are otherwise clear. Mastoid air cells and middle ear cavities are well pneumatized and free of fluid. Skeleton: No worrisome osseous lesions. Prior ACDF at C3 through C7. Upper chest: No other acute finding. Other: None.  IMPRESSION: 1. Findings consistent with acute right submandibular sialoadenitis with associated regional cellulitis. Two adjacent obstructive calculi within the distal right Wharton's duct measuring 5-6 mm each. 2. 13 x 10 x 9 mm rim enhancing collection just inferior to the right mandibular body, consistent with a small associated abscess. 3. Asymmetric prominence of subcentimeter right submandibular and submental lymph nodes, presumably reactive Electronically Signed   By: Morene Hoard M.D.   On: 11/26/2023 19:05     Subjective: - no chest pain, shortness of breath, no abdominal pain, nausea or vomiting.   Discharge Exam: BP (!) 153/78 (BP Location: Left Arm)   Pulse (!) 55   Temp 97.6 F (36.4 C)   Resp 17   Wt 99 kg   SpO2 96%   BMI 34.18 kg/m   General: Pt is alert, awake, not in acute distress Cardiovascular: RRR, S1/S2 +, no rubs, no gallops Respiratory: CTA bilaterally, no wheezing, no rhonchi Abdominal: Soft, NT, ND, bowel sounds + Extremities: no edema, no cyanosis    The results of significant diagnostics from this hospitalization (including imaging, microbiology, ancillary and laboratory) are listed below for reference.     Microbiology: No results found for this or any previous visit (from the past 240 hours).   Labs: Basic Metabolic Panel: Recent Labs  Lab 11/26/23 1624 11/27/23 0452 11/27/23 1856 11/28/23 0656  NA 139 139  --  140  K 3.9 4.0  --  4.2  CL 102 101  --  104  CO2 26 26  --  24  GLUCOSE 106* 100*  --  139*  BUN 11 12  --  19  CREATININE 1.00 1.04 1.11 1.19  CALCIUM  9.0 8.8*  --  9.4  MG  --  1.7  --   --    Liver Function Tests: Recent Labs  Lab 11/27/23 0452 11/28/23 0656  AST 22 17  ALT 17 17  ALKPHOS 98 90  BILITOT 1.5* 0.8  PROT 6.2* 6.4*  ALBUMIN 3.4* 3.4*   CBC: Recent Labs  Lab 11/26/23 1624 11/27/23 0452 11/27/23 1856 11/28/23 0656  WBC 10.9* 9.7 7.3 11.7*  NEUTROABS 7.1 6.1  --   --   HGB 14.8 13.7 13.9  14.1  HCT 42.6 40.1 39.8 40.0  MCV 88.0 88.5 86.3 86.8  PLT 195 165 190 212   CBG: Recent Labs  Lab 11/26/23 1630 11/27/23 1447 11/27/23 1835 11/27/23 1941 11/28/23 0730  GLUCAP 108* 217* 120* 130* 125*   Hgb A1c Recent Labs    11/27/23 1856  HGBA1C 5.3   Lipid Profile No results for input(s): CHOL, HDL, LDLCALC, TRIG, CHOLHDL, LDLDIRECT in the last 72 hours. Thyroid  function studies No results for input(s): TSH, T4TOTAL, T3FREE, THYROIDAB in the last 72 hours.  Invalid input(s): FREET3 Urinalysis    Component Value Date/Time   COLORURINE  YELLOW 10/17/2011 1442   APPEARANCEUR CLEAR 10/17/2011 1442   LABSPEC 1.024 10/17/2011 1442   PHURINE 5.0 10/17/2011 1442   GLUCOSEU NEGATIVE 10/17/2011 1442   HGBUR NEGATIVE 10/17/2011 1442   BILIRUBINUR NEGATIVE 10/17/2011 1442   KETONESUR NEGATIVE 10/17/2011 1442   PROTEINUR NEGATIVE 10/17/2011 1442   UROBILINOGEN 0.2 10/17/2011 1442   NITRITE NEGATIVE 10/17/2011 1442   LEUKOCYTESUR NEGATIVE 10/17/2011 1442    FURTHER DISCHARGE INSTRUCTIONS:   Get Medicines reviewed and adjusted: Please take all your medications with you for your next visit with your Primary MD   Laboratory/radiological data: Please request your Primary MD to go over all hospital tests and procedure/radiological results at the follow up, please ask your Primary MD to get all Hospital records sent to his/her office.   In some cases, they will be blood work, cultures and biopsy results pending at the time of your discharge. Please request that your primary care M.D. goes through all the records of your hospital data and follows up on these results.   Also Note the following: If you experience worsening of your admission symptoms, develop shortness of breath, life threatening emergency, suicidal or homicidal thoughts you must seek medical attention immediately by calling 911 or calling your MD immediately  if symptoms less severe.   You  must read complete instructions/literature along with all the possible adverse reactions/side effects for all the Medicines you take and that have been prescribed to you. Take any new Medicines after you have completely understood and accpet all the possible adverse reactions/side effects.    Do not drive when taking Pain medications or sleeping medications (Benzodaizepines)   Do not take more than prescribed Pain, Sleep and Anxiety Medications. It is not advisable to combine anxiety,sleep and pain medications without talking with your primary care practitioner   Special Instructions: If you have smoked or chewed Tobacco  in the last 2 yrs please stop smoking, stop any regular Alcohol  and or any Recreational drug use.   Wear Seat belts while driving.   Please note: You were cared for by a hospitalist during your hospital stay. Once you are discharged, your primary care physician will handle any further medical issues. Please note that NO REFILLS for any discharge medications will be authorized once you are discharged, as it is imperative that you return to your primary care physician (or establish a relationship with a primary care physician if you do not have one) for your post hospital discharge needs so that they can reassess your need for medications and monitor your lab values.  Time coordinating discharge: 35 minutes  SIGNED:  Nilda Fendt, MD, PhD 11/28/2023, 8:42 AM

## 2023-11-28 NOTE — Discharge Instructions (Signed)
 Follow with Toribio Jerel MATSU, MD in 5-7 days  Please get a complete blood count and chemistry panel checked by your Primary MD at your next visit, and again as instructed by your Primary MD. Please get your medications reviewed and adjusted by your Primary MD.  Please request your Primary MD to go over all Hospital Tests and Procedure/Radiological results at the follow up, please get all Hospital records sent to your Prim MD by signing hospital release before you go home.  In some cases, there will be blood work, cultures and biopsy results pending at the time of your discharge. Please request that your primary care M.D. goes through all the records of your hospital data and follows up on these results.  If you had Pneumonia of Lung problems at the Hospital: Please get a 2 view Chest X ray done in 6-8 weeks after hospital discharge or sooner if instructed by your Primary MD.  If you have Congestive Heart Failure: Please call your Cardiologist or Primary MD anytime you have any of the following symptoms:  1) 3 pound weight gain in 24 hours or 5 pounds in 1 week  2) shortness of breath, with or without a dry hacking cough  3) swelling in the hands, feet or stomach  4) if you have to sleep on extra pillows at night in order to breathe  Follow cardiac low salt diet and 1.5 lit/day fluid restriction.  If you have diabetes Accuchecks 4 times/day, Once in AM empty stomach and then before each meal. Log in all results and show them to your primary doctor at your next visit. If any glucose reading is under 80 or above 300 call your primary MD immediately.  If you have Seizure/Convulsions/Epilepsy: Please do not drive, operate heavy machinery, participate in activities at heights or participate in high speed sports until you have seen by Primary MD or a Neurologist and advised to do so again. Per [place]  DMV statutes, patients with seizures are not allowed to drive until they have been  seizure-free for six months.  Use caution when using heavy equipment or power tools. Avoid working on ladders or at heights. Take showers instead of baths. Ensure the water temperature is not too high on the home water heater. Do not go swimming alone. Do not lock yourself in a room alone (i.e. bathroom). When caring for infants or small children, sit down when holding, feeding, or changing them to minimize risk of injury to the child in the event you have a seizure. Maintain good sleep hygiene. Avoid alcohol.   If you had Gastrointestinal Bleeding: Please ask your Primary MD to check a complete blood count within one week of discharge or at your next visit. Your endoscopic/colonoscopic biopsies that are pending at the time of discharge, will also need to followed by your Primary MD.  Get Medicines reviewed and adjusted. Please take all your medications with you for your next visit with your Primary MD  Please request your Primary MD to go over all hospital tests and procedure/radiological results at the follow up, please ask your Primary MD to get all Hospital records sent to his/her office.  If you experience worsening of your admission symptoms, develop shortness of breath, life threatening emergency, suicidal or homicidal thoughts you must seek medical attention immediately by calling 911 or calling your MD immediately  if symptoms less severe.  You must read complete instructions/literature along with all the possible adverse reactions/side effects for all the Medicines you  take and that have been prescribed to you. Take any new Medicines after you have completely understood and accpet all the possible adverse reactions/side effects.   Do not drive or operate heavy machinery when taking Pain medications.   Do not take more than prescribed Pain, Sleep and Anxiety Medications  Special Instructions: If you have smoked or chewed Tobacco  in the last 2 yrs please stop smoking, stop any regular  Alcohol  and or any Recreational drug use.  Wear Seat belts while driving.  Please note You were cared for by a hospitalist during your hospital stay. If you have any questions about your discharge medications or the care you received while you were in the hospital after you are discharged, you can call the unit and asked to speak with the hospitalist on call if the hospitalist that took care of you is not available. Once you are discharged, your primary care physician will handle any further medical issues. Please note that NO REFILLS for any discharge medications will be authorized once you are discharged, as it is imperative that you return to your primary care physician (or establish a relationship with a primary care physician if you do not have one) for your aftercare needs so that they can reassess your need for medications and monitor your lab values.  You can reach the hospitalist office at phone [phone] or fax [phone]   If you do not have a primary care physician, you can call [phone] for a physician referral.  Activity: As tolerated with Full fall precautions use walker/cane & assistance as needed    Diet: regular  Disposition Home

## 2023-11-29 ENCOUNTER — Telehealth: Payer: Self-pay

## 2023-11-29 NOTE — Transitions of Care (Post Inpatient/ED Visit) (Signed)
 11/29/2023  Name: [name]. MRN: [id number] DOB: Dec 19, 1960  Today's TOC FU Call Status: Today's TOC FU Call Status:: Successful TOC FU Call Completed TOC FU Call Complete Date: 11/29/23 Patient's Name and Date of Birth confirmed.  Transition Care Management Follow-up Telephone Call Date of Discharge: 11/28/23 Discharge Facility: [facility]) Type of Discharge: Inpatient Admission Primary Inpatient Discharge Diagnosis:: Abscess of saliva gland How have you been since you were released from the hospital?: Better Any questions or concerns?: No  Items Reviewed: Did you receive and understand the discharge instructions provided?: Yes Medications obtained,verified, and reconciled?: Yes (Medications Reviewed) Any new allergies since your discharge?: No Dietary orders reviewed?: NA Do you have support at home?: Yes People in Home [RPT]: spouse Name of Support/Comfort Primary Source: Darla Kise  Medications Reviewed Today: Medications Reviewed Today     Reviewed by Moises Reusing, RN (Case Manager) on 11/29/23 at 1235  Med List Status: <None>   Medication Order Taking? Sig Documenting Provider Last Dose Status Informant  allopurinol  (ZYLOPRIM ) 300 MG tablet [id number]  Take 1 tablet (300 mg total) by mouth daily.  Patient taking differently: Take 450 mg by mouth daily.   Comer Kirsch, PA-C  Active Self, Pharmacy Records  ALPRAZolam  (XANAX ) 0.5 MG tablet [id number]  Take 0.5 mg by mouth 3 (three) times daily as needed for anxiety. [provider]  Active Self, Pharmacy Records  amLODipine  (NORVASC ) 5 MG tablet [id number]  Take 5 mg by mouth daily. [provider]  Active Self, Pharmacy Records  amoxicillin -clavulanate (AUGMENTIN ) 875-125 MG tablet [id number]  Take 1 tablet by mouth 2 (two) times daily for 13 days. Gherghe, Costin M, MD  Active   aspirin  EC 81 MG tablet [id number]  Take 1 tablet (81 mg total) by mouth daily. Swallow whole.  Patient  taking differently: Take 81 mg by mouth at bedtime. Swallow whole.   Mallipeddi, Diannah SQUIBB, MD  Active Self, Pharmacy Records  atorvastatin  (LIPITOR) 20 MG tablet [id number]  Take 1 tablet (20 mg total) by mouth daily.  Patient taking differently: Take 20 mg by mouth at bedtime.   Comer Kirsch, PA-C  Active Self, Pharmacy Records  gabapentin  (NEURONTIN ) 300 MG capsule [id number]  Take 300-600 mg by mouth See admin instructions. Take 600 mg in the morning and 300 mg at night [provider]  Active Self, Pharmacy Records  ibuprofen (ADVIL) 200 MG tablet [id number]  Take 800 mg by mouth every 6 (six) hours as needed for fever, headache or mild pain (pain score 1-3). [provider]  Active Self, Pharmacy Records  lisinopril  (ZESTRIL ) 40 MG tablet [id number]  Take 40 mg by mouth daily. [provider]  Active Self, Pharmacy Records  Menthol , Topical Analgesic, (BIOFREEZE EX) [id number]  Apply 1 application topically daily as needed (pain). [provider]  Active Self, Pharmacy Records  Menthol -Methyl Salicylate (SALONPAS PAIN RELIEF PATCH) 3-10 % PTCH [id number]  Apply 1 patch topically every 12 (twelve) hours as needed (pain). [provider]  Active Self, Pharmacy Records  metFORMIN  (GLUCOPHAGE -XR) 500 MG 24 hr tablet [id number]  Take 1,000 mg by mouth at bedtime. [provider]  Active Self, Pharmacy Records  methylPREDNISolone  (MEDROL  DOSEPAK) 4 MG TBPK tablet [id number]  Take 6 tablets (24 mg total) by mouth daily for 1 day, THEN 5 tablets (20 mg total) daily for 1 day, THEN 4 tablets (16 mg total) daily for 1 day, THEN 3 tablets (12 mg total) daily for 1 day, THEN 2  tablets (8 mg total) daily for 1 day, THEN 1 tablet (4 mg total) daily for 1 day. Gherghe, Costin M, MD  Active   metoprolol  tartrate (LOPRESSOR ) 50 MG tablet [id number]  Take 50 mg by mouth in the morning and at bedtime. [provider]  Active Self, Pharmacy Records   nitroGLYCERIN  (NITROSTAT ) 0.4 MG SL tablet [id number]  Place 1 tablet (0.4 mg total) under the tongue every 5 (five) minutes x 3 doses as needed for chest pain (if no relief after 3rd dose, proceed to ED or call 911). Mallipeddi, Diannah SQUIBB, MD  Active Self, Pharmacy Records           Med Note (LEE, NICOLE   Wed Nov 27, 2023  6:28 AM) Has on hand for emergencies.  NON FORMULARY [id number]  1 tablet daily. Super Beta Prostate [provider]  Active Self, Pharmacy Records  oxyCODONE  (OXY IR/ROXICODONE ) 5 MG immediate release tablet [id number] Yes Take 1 tablet (5 mg total) by mouth every 6 (six) hours as needed for moderate pain (pain score 4-6). Gherghe, Costin M, MD  Active   pantoprazole  (PROTONIX ) 40 MG tablet [id number]  Take 40 mg by mouth daily. [provider]  Active Self, Pharmacy Records  sertraline  (ZOLOFT ) 100 MG tablet [id number]  Take 100 mg by mouth daily.  [provider]  Active Self, Pharmacy Records  tamsulosin  (FLOMAX ) 0.4 MG CAPS capsule [id number]  Take 1 capsule (0.4 mg total) by mouth daily. Comer Kirsch, PA-C  Active Self, Pharmacy Records            Home Care and Equipment/Supplies: Were Home Health Services Ordered?: NA Any new equipment or medical supplies ordered?: NA  Functional Questionnaire: Do you need assistance with bathing/showering or dressing?: No Do you need assistance with meal preparation?: No Do you need assistance with eating?: No Do you have difficulty maintaining continence: No Do you need assistance with getting out of bed/getting out of a chair/moving?: No Do you have difficulty managing or taking your medications?: No  Follow up appointments reviewed: PCP Follow-up appointment confirmed?: No (The patient has contacted his PCP to schedule an appointment) MD Provider Line Number:[phone] Given: No Specialist Hospital Follow-up appointment confirmed?: No Reason Specialist Follow-Up Not Confirmed: Patient has  Specialist Provider Number and will Call for Appointment Do you need transportation to your follow-up appointment?: No Do you understand care options if your condition(s) worsen?: Yes-patient verbalized understanding  SDOH Interventions Today    Flowsheet Row Most Recent Value  SDOH Interventions   Food Insecurity Interventions Intervention Not Indicated  Housing Interventions Intervention Not Indicated  Transportation Interventions Intervention Not Indicated  Utilities Interventions Intervention Not Indicated    Medford Balboa, BSN, RN [place]  [facility] - [facility] RN Care Manager [phone]

## 2023-12-02 LAB — CULTURE, BLOOD (ROUTINE X 2)
Culture: NO GROWTH
Culture: NO GROWTH

## 2023-12-26 DIAGNOSIS — M542 Cervicalgia: Secondary | ICD-10-CM | POA: Diagnosis not present

## 2023-12-27 DIAGNOSIS — K2100 Gastro-esophageal reflux disease with esophagitis, without bleeding: Secondary | ICD-10-CM | POA: Diagnosis not present

## 2023-12-27 DIAGNOSIS — K1120 Sialoadenitis, unspecified: Secondary | ICD-10-CM | POA: Diagnosis not present

## 2023-12-27 DIAGNOSIS — E1165 Type 2 diabetes mellitus with hyperglycemia: Secondary | ICD-10-CM | POA: Diagnosis not present

## 2023-12-27 DIAGNOSIS — I10 Essential (primary) hypertension: Secondary | ICD-10-CM | POA: Diagnosis not present

## 2023-12-27 DIAGNOSIS — M1009 Idiopathic gout, multiple sites: Secondary | ICD-10-CM | POA: Diagnosis not present

## 2023-12-28 DIAGNOSIS — I10 Essential (primary) hypertension: Secondary | ICD-10-CM | POA: Diagnosis not present

## 2023-12-28 DIAGNOSIS — E7849 Other hyperlipidemia: Secondary | ICD-10-CM | POA: Diagnosis not present

## 2023-12-28 DIAGNOSIS — E1165 Type 2 diabetes mellitus with hyperglycemia: Secondary | ICD-10-CM | POA: Diagnosis not present

## 2024-03-10 ENCOUNTER — Encounter (INDEPENDENT_AMBULATORY_CARE_PROVIDER_SITE_OTHER): Payer: Self-pay | Admitting: Otolaryngology

## 2024-03-10 ENCOUNTER — Ambulatory Visit (INDEPENDENT_AMBULATORY_CARE_PROVIDER_SITE_OTHER): Admitting: Otolaryngology

## 2024-03-10 VITALS — BP 142/85 | HR 50 | Temp 98.6°F | Ht 67.0 in | Wt 210.0 lb

## 2024-03-10 DIAGNOSIS — K118 Other diseases of salivary glands: Secondary | ICD-10-CM | POA: Diagnosis not present

## 2024-03-10 DIAGNOSIS — K1120 Sialoadenitis, unspecified: Secondary | ICD-10-CM

## 2024-03-10 NOTE — Progress Notes (Signed)
 Reason for Consult: Sialadenitis Referring Physician: Dr. Toribio [name]. is an 64 y.o. male.  HPI: He is here for evaluation of sialadenitis.  He has had a number of episodes of swelling of the right submandibular area.  He has had 1 episode in September where he was hospitalized and had significant swelling of the right submandibular area.  He has had stones that have come out of the [place]'s duct.  Since the hospitalization in September he has persisted with some swelling in the gland area by palpation.  He has been on a couple more antibiotics.  Right now he is feeling great.  He had an enormous stone come out last week and that is when he started really feeling almost completely better.  He has no dysphagia or dyne aphasia.  Past Medical History:  Diagnosis Date   Anxiety    Arthritis    Bladder stone    Depression    Diabetes mellitus without complication (HCC)    GERD (gastroesophageal reflux disease)    Gout    History of kidney stones    Hypertension    Sleep apnea    CPAP    Past Surgical History:  Procedure Laterality Date   ANTERIOR CERVICAL DECOMP/DISCECTOMY FUSION N/A 02/15/2020   Procedure: Anterior Cervical Discectomy and Fusion Cervical Three-Cervical Four/Cervical Four-Cervical Five with Hardware Removal;  Surgeon: Joshua Alm RAMAN, MD;  Location: [facility] OR;  Service: Neurosurgery;  Laterality: N/A;  3C   BICEPS TENDON REPAIR Left    CERVICAL FUSION     EXTRACORPOREAL SHOCK WAVE LITHOTRIPSY Left 04/09/2022   Procedure: LEFT EXTRACORPOREAL SHOCK WAVE LITHOTRIPSY (ESWL);  Surgeon: Lovie Arlyss CROME, MD;  Location: [facility];  Service: Urology;  Laterality: Left;  75 MINUTES NEEDED FOR CASE   EXTRACORPOREAL SHOCK WAVE LITHOTRIPSY Left 05/04/2022   Procedure: LEFT EXTRACORPOREAL SHOCK WAVE LITHOTRIPSY (ESWL);  Surgeon: Selma Donnice SAUNDERS, MD;  Location[facility];  Service: Urology;  Laterality: Left;   HERNIA REPAIR Right    inguinal    LITHOTRIPSY     REVERSE SHOULDER ARTHROPLASTY Right 06/23/2020   Procedure: REVERSE SHOULDER ARTHROPLASTY;  Surgeon: Melita Drivers, MD;  Location: [facility] ORS;  Service: Orthopedics;  Laterality: Right;    SHOULDER ARTHROSCOPY WITH ROTATOR CUFF REPAIR AND SUBACROMIAL DECOMPRESSION Left 07/29/2012   Procedure: LEFT SHOULDER ARTHROSCOPY WITH SUBACROMIAL DECOMPRESSION, DISTAL CLAVICLE RESECTION/ROTATOR CUFF REPAIR ;  Surgeon: Drivers CHRISTELLA Melita, MD;  Location: [facility] OR;  Service: Orthopedics;  Laterality: Left;   SHOULDER SURGERY     UMBILICAL HERNIA REPAIR      Family History  Problem Relation Age of Onset   Suicidality Mother    Lung disease Father     Social History:  reports that he has never smoked. His smokeless tobacco use includes snuff. He reports current alcohol use of about 3.0 - 4.0 standard drinks of alcohol per week. He reports that he does not currently use drugs after having used the following drugs: Crack cocaine and Marijuana.  Allergies: Allergies[1]   No results found for this or any previous visit (from the past 48 hours).  No results found.  ROS Blood pressure (!) 142/85, pulse (!) 50, temperature 98.6 F (37 C), height 5' 7 (1.702 m), weight 210 lb (95.3 kg), SpO2 94%. Physical Exam Constitutional:      Appearance: Normal appearance.  HENT:     Head: Normocephalic and atraumatic.     Right Ear: Tympanic membrane is without lesions  and middle ear aerated, ear canal and external ear normal.     Left Ear: Tympanic membrane is without lesions and middle ear aerated, ear canal and external ear normal.     Nose: Nose without deviation of septum.  Turbinates with mild hypertrophy, No significant swelling or masses.     Oral cavity/oropharynx: The duct looks slightly inflamed and there is a fistula about midway on the superior aspect of the duct where the stone likely came out.  Mucous membranes are moist. No lesions or masses    Larynx: normal voice. Mirror attempted  without success    Eyes:     Extraocular Movements: Extraocular movements intact.     Conjunctiva/sclera: Conjunctivae normal.     Pupils: Pupils are equal, round, and reactive to light.  Cardiovascular:     Rate and Rhythm: Normal rate.  Pulmonary:     Effort: Pulmonary effort is normal.  Musculoskeletal:     Cervical back: Normal range of motion and neck supple. No rigidity.  Lymphadenopathy:     Cervical: No cervical adenopathy or masses.salivary glands without lesions. .     Salivary glands- n the right submandibular gland is firm and about 2-1/2 cm in size.  It is mobile.  It is nontender. Neurological:     Mental Status: He is alert. CN 2-12 intact. No nystagmus      Assessment/Plan: Right submandibular sialadenitis-we talked about the fact that it still firm even since September of the last significant infection.  He has had a very large stone that has come out that may now provide him with some benefit.  We talked about making sure the gland does not have a neoplastic component.  We talked about another CT scan and we discussed a needle biopsy if he does not want this gland removed.  From his standpoint of infection point of view and stone formation he could have a submandibular gland excision at this point.  He would rather hold off on that.  I will order the CAT scan and he will follow-up with me after that study to make a further disposition.  Norleen Notice 03/10/2024, 9:26 AM        [1]  Allergies Allergen Reactions   Kiwi Extract Swelling    Tongue swelling   Strawberry Extract Swelling    Tongue swelling

## 2024-03-25 ENCOUNTER — Ambulatory Visit (HOSPITAL_BASED_OUTPATIENT_CLINIC_OR_DEPARTMENT_OTHER)
Admission: RE | Admit: 2024-03-25 | Discharge: 2024-03-25 | Disposition: A | Discharge status: Home / Self Care | Source: Ambulatory Visit | Attending: Otolaryngology | Admitting: Otolaryngology

## 2024-03-25 DIAGNOSIS — K115 Sialolithiasis: Secondary | ICD-10-CM | POA: Diagnosis not present

## 2024-03-25 DIAGNOSIS — K118 Other diseases of salivary glands: Secondary | ICD-10-CM | POA: Diagnosis present

## 2024-03-25 MED ORDER — IOHEXOL 300 MG/ML  SOLN
75.0000 mL | Freq: Once | INTRAMUSCULAR | Status: AC | PRN
  Administered 2024-03-25: 75 mL via INTRAVENOUS

## 2024-03-31 ENCOUNTER — Telehealth (INDEPENDENT_AMBULATORY_CARE_PROVIDER_SITE_OTHER): Payer: Self-pay

## 2024-03-31 NOTE — Telephone Encounter (Signed)
 Called patient call was answered let patient know that Dr. Roark wanted him to be informed that he still has a stone in the salivary gland and a large tense gland on that side. He needs to be considered for excision of this gland so he needs to be referred to one of our doctors that operates. Got him scheduled with Dr. Masciello. Patient understood.

## 2024-04-01 LAB — POCT I-STAT CREATININE: Creatinine, Ser: 1 mg/dL (ref 0.61–1.24)

## 2024-04-22 ENCOUNTER — Ambulatory Visit (INDEPENDENT_AMBULATORY_CARE_PROVIDER_SITE_OTHER)
